# Patient Record
Sex: Male | Born: 1970 | Race: White | Hispanic: No | Marital: Single | Smoking: Current every day smoker
Health system: Southern US, Community
[De-identification: ages and names within clinical notes are randomized; demographics above are authoritative.]

## PROBLEM LIST (undated history)

## (undated) ENCOUNTER — Emergency Department: Admission: EM | Payer: 59 | Source: Home / Self Care

## (undated) DIAGNOSIS — D649 Anemia, unspecified: Secondary | ICD-10-CM

## (undated) DIAGNOSIS — J9690 Respiratory failure, unspecified, unspecified whether with hypoxia or hypercapnia: Secondary | ICD-10-CM

## (undated) DIAGNOSIS — F1910 Other psychoactive substance abuse, uncomplicated: Secondary | ICD-10-CM

## (undated) DIAGNOSIS — K7290 Hepatic failure, unspecified without coma: Secondary | ICD-10-CM

## (undated) DIAGNOSIS — F1010 Alcohol abuse, uncomplicated: Secondary | ICD-10-CM

---

## 2017-05-04 ENCOUNTER — Emergency Department (HOSPITAL_COMMUNITY)
Admission: EM | Admit: 2017-05-04 | Discharge: 2017-05-04 | Disposition: A | Discharge status: Home / Self Care | Payer: BLUE CROSS/BLUE SHIELD | Attending: Emergency Medicine | Admitting: Emergency Medicine

## 2017-05-04 ENCOUNTER — Encounter (HOSPITAL_COMMUNITY): Payer: Self-pay | Admitting: Family Medicine

## 2017-05-04 DIAGNOSIS — Z79891 Long term (current) use of opiate analgesic: Secondary | ICD-10-CM | POA: Diagnosis not present

## 2017-05-04 DIAGNOSIS — F329 Major depressive disorder, single episode, unspecified: Secondary | ICD-10-CM | POA: Insufficient documentation

## 2017-05-04 DIAGNOSIS — F1994 Other psychoactive substance use, unspecified with psychoactive substance-induced mood disorder: Secondary | ICD-10-CM | POA: Diagnosis present

## 2017-05-04 DIAGNOSIS — F32A Depression, unspecified: Secondary | ICD-10-CM

## 2017-05-04 HISTORY — DX: Other psychoactive substance abuse, uncomplicated: F19.10

## 2017-05-04 LAB — RAPID URINE DRUG SCREEN, HOSP PERFORMED
Amphetamines: NOT DETECTED
BARBITURATES: NOT DETECTED
BENZODIAZEPINES: NOT DETECTED
COCAINE: NOT DETECTED
OPIATES: POSITIVE — AB
TETRAHYDROCANNABINOL: NOT DETECTED

## 2017-05-04 LAB — COMPREHENSIVE METABOLIC PANEL
ALT: 89 U/L — AB (ref 17–63)
AST: 41 U/L (ref 15–41)
Albumin: 4.3 g/dL (ref 3.5–5.0)
Alkaline Phosphatase: 46 U/L (ref 38–126)
Anion gap: 10 (ref 5–15)
BUN: 24 mg/dL — AB (ref 6–20)
CHLORIDE: 101 mmol/L (ref 101–111)
CO2: 29 mmol/L (ref 22–32)
CREATININE: 0.95 mg/dL (ref 0.61–1.24)
Calcium: 9.3 mg/dL (ref 8.9–10.3)
GFR calc Af Amer: >60 mL/min (ref >60)
Glucose, Bld: 95 mg/dL (ref 65–99)
POTASSIUM: 3.6 mmol/L (ref 3.5–5.1)
SODIUM: 140 mmol/L (ref 135–145)
Total Bilirubin: 1.6 mg/dL — ABNORMAL HIGH (ref 0.3–1.2)
Total Protein: 7.7 g/dL (ref 6.5–8.1)

## 2017-05-04 LAB — CBC WITH DIFFERENTIAL/PLATELET
BASOS ABS: 0 10*3/uL (ref 0.0–0.1)
BASOS PCT: 1 %
EOS ABS: 0.2 10*3/uL (ref 0.0–0.7)
EOS PCT: 2 %
HCT: 48.1 % (ref 39.0–52.0)
Hemoglobin: 16.5 g/dL (ref 13.0–17.0)
LYMPHS PCT: 29 %
Lymphs Abs: 2.1 10*3/uL (ref 0.7–4.0)
MCH: 32.4 pg (ref 26.0–34.0)
MCHC: 34.3 g/dL (ref 30.0–36.0)
MCV: 94.3 fL (ref 78.0–100.0)
MONO ABS: 0.8 10*3/uL (ref 0.1–1.0)
Monocytes Relative: 11 %
Neutro Abs: 4.2 10*3/uL (ref 1.7–7.7)
Neutrophils Relative %: 57 %
PLATELETS: 230 10*3/uL (ref 150–400)
RBC: 5.1 MIL/uL (ref 4.22–5.81)
RDW: 13.1 % (ref 11.5–15.5)
WBC: 7.3 10*3/uL (ref 4.0–10.5)

## 2017-05-04 LAB — ETHANOL: Alcohol, Ethyl (B): <10 mg/dL (ref <10)

## 2017-05-04 LAB — ACETAMINOPHEN LEVEL

## 2017-05-04 LAB — SALICYLATE LEVEL

## 2017-05-04 MED ORDER — LORAZEPAM 2 MG/ML IJ SOLN: 0.0000 mg | Freq: Two times a day (BID) | INTRAMUSCULAR | Status: DC

## 2017-05-04 MED ORDER — VITAMIN B-1 100 MG PO TABS
100.0000 mg | ORAL_TABLET | Freq: Every day | ORAL | Status: DC
  Administered 2017-05-04: 100 mg via ORAL
  Filled 2017-05-04: qty 1

## 2017-05-04 MED ORDER — LORAZEPAM 1 MG PO TABS: 0.0000 mg | ORAL_TABLET | Freq: Two times a day (BID) | ORAL | Status: DC

## 2017-05-04 MED ORDER — LORAZEPAM 2 MG/ML IJ SOLN: 0.0000 mg | Freq: Four times a day (QID) | INTRAMUSCULAR | Status: DC

## 2017-05-04 MED ORDER — THIAMINE HCL 100 MG/ML IJ SOLN
100.0000 mg | Freq: Every day | INTRAMUSCULAR | Status: DC
  Filled 2017-05-04: qty 2

## 2017-05-04 MED ORDER — LORAZEPAM 1 MG PO TABS: 0.0000 mg | ORAL_TABLET | Freq: Four times a day (QID) | ORAL | Status: DC

## 2017-05-04 NOTE — ED Triage Notes (Signed)
Patient has been accompanied by his stepfather and friend tonight. Patient family is reporting patient could be suicidal and is currently under the influence of drugs. Step father reports patient lives him and patients mother. He has a suicidal history with attempts. Today, patient was driving a company vehicle in [place] when we was taken in police custody for driving while impaired. Initially,  stepfather received a call from patient around 17:30 stating he was a the police station and would need a ride home. About 2 hours later, he received a call from an officer reporting patient was at [facility]. Patient was discharged and informed to family that the hospital was unable to keep him. When triaging patient, he became non-verbal and would not answer questions except to say he does not see it the same way his step father sees things. Patient appears to be under the influence since he has a delayed response.

## 2017-05-04 NOTE — ED Notes (Signed)
Gave report to Holly, RN

## 2017-05-04 NOTE — ED Notes (Signed)
Bed: [facility]3 Expected date:  Expected time:  Means of arrival:  Comments: 

## 2017-05-04 NOTE — Discharge Instructions (Signed)
Please follow up with substance abuse treatment:   [facility] [address] [place], [place] [phone] or [phone]  [facility] [phone] [address] [place] [phone]  [facility]idential [facility]) [address] [place], [place] [phone]  [facility] [address],  [place], [place] [phone]  [facility] [place] [address]  [place], [place], [phone]

## 2017-05-04 NOTE — ED Notes (Signed)
Patient dressed in purple scrubs, belongings placed in cabinet.   TTS at bedside.

## 2017-05-04 NOTE — Patient Outreach (Signed)
ED Peer Support Specialist Patient Intake (Complete at intake & 30-60 Day Follow-up)  Name: Cody RushingDanny Todd [name]  MRN: [id number]  Age: 46 y.o.   Date of Admission: 05/04/2017  Intake: Initial Comments:      Primary Reason Admitted:Pt did not give much information and appeared to be a poor historian. Pt was slow to respond to questions with a flat affect. Pt currently denies SI, HI or AVH. He denies having a history of this. Writer did a record review and pt has not been to [facility] but [facility]. Per notes pt was transported there from jail after stating that he had some "one sided weakness". Full medical work up was done and pt did not seem to have any medical issues. It is suspected pt had secondary gain to go to the hospital to get out of jail. Pt admitted that he has had issues with addiction in the past but denies getting treatment. Unsure if this is accurate information. Pt currently lives with his step dad and mom and step dad brought [facility].  Collateral was gathered from step dad Tim Ney: Jorja Loaim states that pt has been in prison a total of 10 years but has been out for 5. He states that he was using drugs heavily at this time but has not been doing drugs since he got out. He states that he has been using 4-5 beers a night and can become verbally aggressive when he drinks. Step dad states that he is worried about the pt's safety and the safety of the people in the home. He states that he has been "spiraling out of control" and told his mom that he was going to kill himself (a week ago). Pt recently had a break up with his fiance which step dad believes triggered his suicidal ideation. Pt denies that he is suicidal. Step dad believes he is using again and feels unsafe to allow him back in his home. He states that he "needs help" and wants him to get into treatment.      Lab values: Alcohol/ETOH: Negative Positive UDS? 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 No  Number of ED visits:    Reason(s) for visit:    In past year, has patient been hospitalized for any reason? No  Number of hospitalizations:    Reason(s) for hospitalization:    In past year, has patient been arrested? No  Number of arrests:    Reason(s) for arrest:    In past year, has patient been incarcerated? No  Number of incarcerations:    Reason(s) for incarceration:    In past year, has patient received medication-assisted treatment? No  In past year, patient received the following treatments:    In past year, has patient received any harm reduction services? No  Did this include any of the following?    In past year, has patient received care from a mental health provider for diagnosis other than SUD? No  In past year, is this first time patient has overdosed? No  Number of past overdoses:    In  past year, is this first time patient has been hospitalized for an overdose? No  Number of hospitalizations for overdose(s):    Is patient currently receiving treatment for a mental health diagnosis? No  Patient reports experiencing difficulty participating in SUD treatment: No    Most important reason(s) for this difficulty?    Has patient received prior services for treatment? No  In past, patient has received services from following agencies:    Plan of Care:  Suggested follow up at these agencies/treatment centers:    Other information:  CPSS Arlys JohnBrian and Jonny RuizJohn talked with Pt and  try to make positive effort to understand what services Pt may want. CPSS asked the required questions and was unable to gain any information from Pt. CPSS Arlys JohnBrian and John left contact information to follow up with him for outpatient services.    Arlys JohnBrian Quintan Saldivar, CPSS  05/04/2017 12:03 PM

## 2017-05-04 NOTE — BH Assessment (Signed)
Assessment Note  [name] is an 46 y.o. male who came to [facility] with step dad after relapsing on opiates the last few days and getting arrested for driving under the influence yesterday. Pt did not give much information and appeared to be a poor historian. Pt was slow to respond to questions with a flat affect. Pt currently denies SI, HI or AVH. He denies having a history of this. Writer did a record review and pt has not been to [facility] ED but was [facility]. Per notes pt was transported there from jail after stating that he had some "one sided weakness". Full medical work up was done and pt did not seem to have any medical issues. It is suspected pt had secondary gain to go to the hospital to get out of jail. Pt admitted that he has had issues with addiction in the past but denies getting treatment. Unsure if this is accurate information. Pt currently lives with his step dad and mom and step dad [facility].  Collateral was gathered from step dad Tim Ney: Jorja Loaim states that pt has been in prison a total of 10 years but has been out for 5. He states that he was using drugs heavily at this time but has not been doing drugs since he got out. He states that he has been using 4-5 beers a night and can become verbally aggressive when he drinks. Step dad states that he is worried about the pt's safety and the safety of the people in the home. He states that he has been "spiraling out of control" and told his mom that he was going to kill himself (a week ago). Pt recently had a break up with his fiance which step dad believes triggered his suicidal ideation. Pt denies that he is suicidal. Step dad believes he is using again and feels unsafe to allow him back in his home. He states that he "needs help" and wants him to get into treatment.   Consulted with Dr. Sharma CovertNorman and Elta GuadeloupeLaurie Parks NP who do not feel pt meets inpatient criteria. Step Dad has been given resources for substance abuse  treatment and this information will be included in the AVS as well.    Diagnosis: Opiate use disorder severe, Alcohol use disorder severe  Past Medical History:  Past Medical History:  Diagnosis Date  . Polysubstance abuse (HCC)     History reviewed. No pertinent surgical history.  Family History: History reviewed. No pertinent family history.  Social History:  reports that  has never smoked. he has never used smokeless tobacco. He reports that he drinks alcohol. He reports that he uses drugs.  Additional Social History:  Alcohol / Drug Use History of alcohol / drug use?: Yes Substance #1 Name of Substance 1: Opiates- history of addiction  1 - Last Use / Amount: yesterday   CIWA: CIWA-Ar BP: 106/76 Pulse Rate: (!) 54 Nausea and Vomiting: no nausea and no vomiting Tactile Disturbances: none Tremor: no tremor Auditory Disturbances: not present Paroxysmal Sweats: no sweat visible Visual Disturbances: not present Anxiety: no anxiety, at ease Headache, Fullness in Head: none present Agitation: normal activity Orientation and Clouding of Sensorium: cannot do serial additions or is uncertain about date CIWA-Ar Total: 1 COWS:    Allergies: No Known Allergies  Home Medications:  (Not in a hospital admission)  OB/GYN Status:  No LMP for male patient.  General Assessment Data [facility] T[facility] Assessment:  In system Is this a Tele or Face-to-Face Assessment?: Face-to-Face Is this an Initial Assessment or a Re-assessment for this encounter?: Initial Assessment Marital status: (UTA) Is patient pregnant?: No Pregnancy Status: No Living Arrangements: Parent Can pt return to current living arrangement?: No Admission Status: Voluntary Is patient capable of signing voluntary admission?: No Referral Source: Self/Family/Friend Insurance type: BCBS      Crisis Care Plan Living Arrangements: Parent Name of Psychiatrist: None Name of Therapist:  None  Education Status Is patient currently in school?: No  Risk to self with the past 6 months Suicidal Ideation: No Has patient been a risk to self within the past 6 months prior to admission? : Yes Suicidal Intent: No Has patient had any suicidal intent within the past 6 months prior to admission? : No Is patient at risk for suicide?: No Suicidal Plan?: No Has patient had any suicidal plan within the past 6 months prior to admission? : No Access to Means: No What has been your use of drugs/alcohol within the last 12 months?: using opiates Previous Attempts/Gestures: No How many times?: 0 Other Self Harm Risks: None Triggers for Past Attempts: None known Intentional Self Injurious Behavior: None Family Suicide History: Unknown Recent stressful life event(s): Other (Comment) Persecutory voices/beliefs?: No Depression: Yes Depression Symptoms: Despondent Substance abuse history and/or treatment for substance abuse?: Yes Suicide prevention information given to non-admitted patients: Not applicable  Risk to Others within the past 6 months Homicidal Ideation: No Does patient have any lifetime risk of violence toward others beyond the six months prior to admission? : No Thoughts of Harm to Others: No Current Homicidal Intent: No Current Homicidal Plan: No Access to Homicidal Means: No Identified Victim: None History of harm to others?: No Assessment of Violence: None Noted Violent Behavior Description: none Does patient have access to weapons?: No Criminal Charges Pending?: No Does patient have a court date: No Is patient on probation?: No  Psychosis Hallucinations: None noted Delusions: None noted  Mental Status Report Appearance/Hygiene: Unremarkable Eye Contact: Fair Motor Activity: Freedom of movement Speech: Logical/coherent Level of Consciousness: Drowsy, Sedated Mood: Depressed Affect: Depressed Anxiety Level: None Judgement: Impaired Orientation: Person,  Place, Time, Situation Obsessive Compulsive Thoughts/Behaviors: None  Cognitive Functioning Concentration: Poor Memory: Recent Intact, Remote Intact IQ: Average Insight: Poor Impulse Control: Poor Appetite: Fair Weight Loss: 0 Weight Gain: 0 Sleep: Decreased Total Hours of Sleep: 5  ADLScreening [facility] Assessment Services) Patient's cognitive ability adequate to safely complete daily activities?: Yes Patient able to express need for assistance with ADLs?: Yes Independently performs ADLs?: Yes (appropriate for developmental age)  Prior Inpatient Therapy Prior Inpatient Therapy: (UTA)  Prior Outpatient Therapy Prior Outpatient Therapy: (UTA) Does patient have an ACCT team?: No Does patient have Intensive In-House Services?  : No Does patient have [facility] services? : No Does patient have P4CC services?: No  ADL Screening (condition at time of admission) Patient's cognitive ability adequate to safely complete daily activities?: Yes Is the patient deaf or have difficulty hearing?: No Does the patient have difficulty seeing, even when wearing glasses/contacts?: No Does the patient have difficulty concentrating, remembering, or making decisions?: No Patient able to express need for assistance with ADLs?: Yes Does the patient have difficulty dressing or bathing?: No Independently performs ADLs?: Yes (appropriate for developmental age) Does the patient have difficulty walking or climbing stairs?: No Weakness of Legs: None Weakness of Arms/Hands: None  Home Assistive Devices/Equipment Home Assistive Devices/Equipment: None  Therapy Consults (therapy consults require a physician  order) PT Evaluation Needed: No OT Evalulation Needed: No SLP Evaluation Needed: No Abuse/Neglect Assessment (Assessment to be complete while patient is alone) Abuse/Neglect Assessment Can Be Completed: Unable to assess, patient is non-responsive or altered mental status Values / Beliefs Cultural  Requests During Hospitalization: None Spiritual Requests During Hospitalization: None Consults Spiritual Care Consult Needed: No Social Work Consult Needed: No Merchant navy officer (For Healthcare) Does Patient Have a Medical Advance Directive?: No Would patient like information on creating a medical advance directive?: No - Patient declined Nutrition Screen- [facility] Patient's home diet: Regular Has the patient recently lost weight without trying?: No Has the patient been eating poorly because of a decreased appetite?: No Malnutrition Screening Tool Score: 0  Additional Information 1:1 In Past 12 Months?: No CIRT Risk: No Elopement Risk: No Does patient have medical clearance?: Yes     Disposition: D/C with outpatient resources per Dr. Sharma Covert. Peer support consulted. Disposition Initial Assessment Completed for this Encounter: Yes  On Site Evaluation by:   Donetta Potts, LCAS  Reviewed with Physician:  Dr. Burke Keels [facility] 05/04/2017 10:57 AM

## 2017-05-04 NOTE — ED Notes (Signed)
Pt aware we need urine sample.  

## 2017-05-04 NOTE — [facility] Suicide Risk Assessment (Signed)
Suicide Risk Assessment  Discharge Assessment   [facility]   Principal Problem: Substance induced mood disorder [facility](HCC) Discharge Diagnoses:  Patient Active Problem List   Diagnosis Date Noted  . Substance induced mood disorder (HCC) [F19.94] 05/04/2017    Total Time spent with patient: 30 minutes  Musculoskeletal: Strength & Muscle Tone: within normal limits Gait & Station: normal Patient leans: N/A  Psychiatric Specialty Exam:   Blood pressure (!) 101/58, pulse 66, temperature 97.9 F (36.6 C), temperature source Oral, resp. rate 16, SpO2 99 %.There is no height or weight on file to calculate BMI.  General Appearance: Casual  Eye Contact::  Good  Speech:  Clear and Coherent409  Volume:  Normal  Mood:  Depressed  Affect:  Congruent and Depressed  Thought Process:  Coherent  Orientation:  Full (Time, Place, and Person)  Thought Content:  Logical  Suicidal Thoughts:  No  Homicidal Thoughts:  No  Memory:  Immediate;   Good Recent;   Good Remote;   Fair  Judgement:  Fair  Insight:  Fair  Psychomotor Activity:  Normal  Concentration:  Fair  Recall:  Good  Fund of Knowledge:Good  Language: Good  Akathisia:  No  Handed:  Right  AIMS (if indicated):     Assets:  ArchitectCommunication Skills Financial Resources/Insurance Housing Physical Health Social Support  Sleep:     Cognition: WNL  ADL's:  Intact   Mental Status Per Nursing Assessment::   On Admission:   Intoxicated  Demographic Factors:  Male  Loss Factors: Financial problems/change in socioeconomic status  Historical Factors: Impulsivity  Risk Reduction Factors:   Sense of responsibility to family  Continued Clinical Symptoms:  Depression:   Comorbid alcohol abuse/dependence  Cognitive Features That Contribute To Risk:  Closed-mindedness    Suicide Risk:  Minimal: No identifiable suicidal ideation.  Patients presenting with no risk factors but with morbid ruminations; may  be classified as minimal risk based on the severity of the depressive symptoms    Plan Of Care/Follow-up recommendations:  Activity:  as tolerated Diet:  Heart Healthy  Laveda AbbeLaurie Britton Parks, NP 05/04/2017, 12:33 PM

## 2017-05-04 NOTE — ED Provider Notes (Signed)
[facility]-EMERGENCY DEPT Provider Note   CSN: [id number] Arrival date & time: 05/04/17  0007     History   Chief Complaint Chief Complaint  Patient presents with  . Psychiatric Evaluation    HPI [name] is a 46 y.o. male.  Patient presents to the emergency department with chief complaint of depression, suicidal 8 deviation, and substance abuse.  He is accompanied by his stepfather, he states that the patient has been very depressed and suicidal since breaking up with his fiance a couple of months ago.  His stepfather reports that the patient has been using drugs and alcohol extensively.  He reports that the patient is not acting himself.  Stepfather reports that the patient has made violent threats towards his mother, and has also expressed intent to commit suicide.  Patient denies any pain.  Denies any symptoms at this time.   The history is provided by the patient. No language interpreter was used.    Past Medical History:  Diagnosis Date  . Polysubstance abuse (HCC)     There are no active problems to display for this patient.   History reviewed. No pertinent surgical history.     Home Medications    Prior to Admission medications   Not on File    Family History History reviewed. No pertinent family history.  Social History Social History   Tobacco Use  . Smoking status: Never Smoker  . Smokeless tobacco: Never Used  Substance Use Topics  . Alcohol use: Yes  . Drug use: Yes     Allergies   Patient has no allergy information on record.   Review of Systems Review of Systems  All other systems reviewed and are negative.    Physical Exam Updated Vital Signs There were no vitals taken for this visit.  Physical Exam  Constitutional: He is oriented to person, place, and time. He appears well-developed and well-nourished.  HENT:  Head: Normocephalic and atraumatic.  Eyes: Conjunctivae and EOM are normal. Pupils  are equal, round, and reactive to light. Right eye exhibits no discharge. Left eye exhibits no discharge. No scleral icterus.  Neck: Normal range of motion. Neck supple. No JVD present.  Cardiovascular: Normal rate, regular rhythm and normal heart sounds. Exam reveals no gallop and no friction rub.  No murmur heard. Pulmonary/Chest: Effort normal and breath sounds normal. No respiratory distress. He has no wheezes. He has no rales. He exhibits no tenderness.  Abdominal: Soft. He exhibits no distension and no mass. There is no tenderness. There is no rebound and no guarding.  Musculoskeletal: Normal range of motion. He exhibits no edema or tenderness.  Neurological: He is alert and oriented to person, place, and time.  Skin: Skin is warm and dry.  Psychiatric: He has a normal mood and affect. His behavior is normal. Judgment and thought content normal.  Nursing note and vitals reviewed.    ED Treatments / Results  Labs (all labs ordered are listed, but only abnormal results are displayed) Labs Reviewed  CBC WITH DIFFERENTIAL/PLATELET  COMPREHENSIVE METABOLIC PANEL  ACETAMINOPHEN LEVEL  SALICYLATE LEVEL  ETHANOL  RAPID URINE DRUG SCREEN, HOSP PERFORMED    EKG  EKG Interpretation None       Radiology No results found.  Procedures Procedures (including critical care time)  Medications Ordered in ED Medications - No data to display   Initial Impression / Assessment and Plan / ED Course  I have reviewed the triage vital signs and the  nursing notes.  Pertinent labs & imaging results that were available during my care of the patient were reviewed by me and considered in my medical decision making (see chart for details).     Patient here for depression, suicidal ideation, and substance abuse.  Patient is currently not very talkative, but does respond to direct questions.  Will perform medical screening exam and labs, plan for TTS and psychiatry evaluation.  Patient  currently here voluntarily.  6:05 AM Medically clear, observed in ED overnight.  Labs are reassuring.    TTS consult pending.  Morning team aware of patient.  Final Clinical Impressions(s) / ED Diagnoses   Final diagnoses:  None    ED Discharge Orders    None       Roxy HorsemanBrowning, Lamoyne Hessel, PA-C 05/04/17 0606    Gilda CreasePollina, Christopher J, MD 05/04/17 [phone]

## 2020-01-20 ENCOUNTER — Other Ambulatory Visit: Payer: Self-pay

## 2020-01-20 DIAGNOSIS — M25551 Pain in right hip: Secondary | ICD-10-CM | POA: Insufficient documentation

## 2020-01-20 DIAGNOSIS — Y9389 Activity, other specified: Secondary | ICD-10-CM | POA: Insufficient documentation

## 2020-01-20 DIAGNOSIS — Y999 Unspecified external cause status: Secondary | ICD-10-CM | POA: Insufficient documentation

## 2020-01-20 DIAGNOSIS — Y929 Unspecified place or not applicable: Secondary | ICD-10-CM | POA: Insufficient documentation

## 2020-01-20 DIAGNOSIS — R42 Dizziness and giddiness: Secondary | ICD-10-CM | POA: Insufficient documentation

## 2020-01-20 DIAGNOSIS — Z5321 Procedure and treatment not carried out due to patient leaving prior to being seen by health care provider: Secondary | ICD-10-CM | POA: Insufficient documentation

## 2020-01-20 DIAGNOSIS — R220 Localized swelling, mass and lump, head: Secondary | ICD-10-CM | POA: Insufficient documentation

## 2020-01-20 DIAGNOSIS — W228XXA Striking against or struck by other objects, initial encounter: Secondary | ICD-10-CM | POA: Insufficient documentation

## 2020-01-20 LAB — CBC
HCT: 29.5 % — ABNORMAL LOW (ref 39.0–52.0)
Hemoglobin: 10.4 g/dL — ABNORMAL LOW (ref 13.0–17.0)
MCH: 32.9 pg (ref 26.0–34.0)
MCHC: 35.3 g/dL (ref 30.0–36.0)
MCV: 93.4 fL (ref 80.0–100.0)
Platelets: 64 10*3/uL — ABNORMAL LOW (ref 150–400)
RBC: 3.16 MIL/uL — ABNORMAL LOW (ref 4.22–5.81)
RDW: 18.3 % — ABNORMAL HIGH (ref 11.5–15.5)
WBC: 8.5 10*3/uL (ref 4.0–10.5)
nRBC: 0 % (ref 0.0–0.2)

## 2020-01-20 LAB — BASIC METABOLIC PANEL
Anion gap: 13 (ref 5–15)
BUN: 28 mg/dL — ABNORMAL HIGH (ref 6–20)
CO2: 25 mmol/L (ref 22–32)
Calcium: 7.3 mg/dL — ABNORMAL LOW (ref 8.9–10.3)
Chloride: 94 mmol/L — ABNORMAL LOW (ref 98–111)
Creatinine, Ser: 1.39 mg/dL — ABNORMAL HIGH (ref 0.61–1.24)
GFR calc Af Amer: >60 mL/min (ref >60)
GFR calc non Af Amer: 59 mL/min — ABNORMAL LOW (ref >60)
Glucose, Bld: 119 mg/dL — ABNORMAL HIGH (ref 70–99)
Potassium: 3.4 mmol/L — ABNORMAL LOW (ref 3.5–5.1)
Sodium: 132 mmol/L — ABNORMAL LOW (ref 135–145)

## 2020-01-20 NOTE — ED Triage Notes (Signed)
Pt comes via EMS with c/o right hip pain following dizzy fall 4 days ago., pt also c/o left sided facial swelling. Pt states he hit his face on the walker.  Pt states his leg just gave out and went numb.

## 2020-01-21 ENCOUNTER — Emergency Department
Admission: EM | Admit: 2020-01-21 | Discharge: 2020-01-21 | Disposition: A | Discharge status: Home / Self Care | Payer: Self-pay | Attending: Emergency Medicine | Admitting: Emergency Medicine

## 2020-01-21 ENCOUNTER — Emergency Department: Payer: Self-pay

## 2020-01-21 LAB — TROPONIN I (HIGH SENSITIVITY): Troponin I (High Sensitivity): 39 ng/L — ABNORMAL HIGH (ref <18)

## 2020-01-21 NOTE — ED Notes (Addendum)
Pt states he called an Benedetto Goad and is leaving.  Tried to tell pt he was next in line to be seen, But he is at the top of the parking lot.

## 2020-01-22 ENCOUNTER — Telehealth: Payer: Self-pay | Admitting: Emergency Medicine

## 2020-01-22 NOTE — Telephone Encounter (Signed)
Called patient due to lwot to inquire about condition and follow up plans. Left message.   

## 2020-01-23 ENCOUNTER — Other Ambulatory Visit: Payer: Self-pay

## 2020-01-23 ENCOUNTER — Emergency Department: Payer: Self-pay

## 2020-01-23 ENCOUNTER — Encounter: Payer: Self-pay | Admitting: Emergency Medicine

## 2020-01-23 ENCOUNTER — Inpatient Hospital Stay
Admission: EM | Admit: 2020-01-23 | Discharge: 2020-02-14 | DRG: 870 | Disposition: A | Discharge status: Home / Self Care | Payer: Self-pay | Attending: Internal Medicine | Admitting: Internal Medicine

## 2020-01-23 ENCOUNTER — Inpatient Hospital Stay: Payer: Self-pay

## 2020-01-23 DIAGNOSIS — S1093XA Contusion of unspecified part of neck, initial encounter: Secondary | ICD-10-CM | POA: Diagnosis present

## 2020-01-23 DIAGNOSIS — I110 Hypertensive heart disease with heart failure: Secondary | ICD-10-CM | POA: Diagnosis present

## 2020-01-23 DIAGNOSIS — D62 Acute posthemorrhagic anemia: Secondary | ICD-10-CM | POA: Diagnosis present

## 2020-01-23 DIAGNOSIS — K759 Inflammatory liver disease, unspecified: Secondary | ICD-10-CM

## 2020-01-23 DIAGNOSIS — I5031 Acute diastolic (congestive) heart failure: Secondary | ICD-10-CM | POA: Diagnosis not present

## 2020-01-23 DIAGNOSIS — D696 Thrombocytopenia, unspecified: Secondary | ICD-10-CM | POA: Diagnosis present

## 2020-01-23 DIAGNOSIS — W010XXA Fall on same level from slipping, tripping and stumbling without subsequent striking against object, initial encounter: Secondary | ICD-10-CM | POA: Diagnosis present

## 2020-01-23 DIAGNOSIS — F10931 Alcohol use, unspecified with withdrawal delirium: Secondary | ICD-10-CM

## 2020-01-23 DIAGNOSIS — X501XXA Overexertion from prolonged static or awkward postures, initial encounter: Secondary | ICD-10-CM

## 2020-01-23 DIAGNOSIS — R7401 Elevation of levels of liver transaminase levels: Secondary | ICD-10-CM | POA: Diagnosis present

## 2020-01-23 DIAGNOSIS — J690 Pneumonitis due to inhalation of food and vomit: Secondary | ICD-10-CM | POA: Diagnosis not present

## 2020-01-23 DIAGNOSIS — J9602 Acute respiratory failure with hypercapnia: Secondary | ICD-10-CM | POA: Diagnosis not present

## 2020-01-23 DIAGNOSIS — M25571 Pain in right ankle and joints of right foot: Secondary | ICD-10-CM | POA: Diagnosis not present

## 2020-01-23 DIAGNOSIS — S8011XA Contusion of right lower leg, initial encounter: Secondary | ICD-10-CM | POA: Diagnosis present

## 2020-01-23 DIAGNOSIS — D7589 Other specified diseases of blood and blood-forming organs: Secondary | ICD-10-CM | POA: Diagnosis present

## 2020-01-23 DIAGNOSIS — Z515 Encounter for palliative care: Secondary | ICD-10-CM

## 2020-01-23 DIAGNOSIS — W100XXS Fall (on)(from) escalator, sequela: Secondary | ICD-10-CM

## 2020-01-23 DIAGNOSIS — K7010 Alcoholic hepatitis without ascites: Secondary | ICD-10-CM | POA: Diagnosis present

## 2020-01-23 DIAGNOSIS — F10229 Alcohol dependence with intoxication, unspecified: Secondary | ICD-10-CM | POA: Diagnosis present

## 2020-01-23 DIAGNOSIS — A419 Sepsis, unspecified organism: Principal | ICD-10-CM | POA: Diagnosis present

## 2020-01-23 DIAGNOSIS — D539 Nutritional anemia, unspecified: Secondary | ICD-10-CM | POA: Diagnosis present

## 2020-01-23 DIAGNOSIS — F1010 Alcohol abuse, uncomplicated: Secondary | ICD-10-CM

## 2020-01-23 DIAGNOSIS — M25551 Pain in right hip: Secondary | ICD-10-CM

## 2020-01-23 DIAGNOSIS — W19XXXA Unspecified fall, initial encounter: Secondary | ICD-10-CM | POA: Diagnosis present

## 2020-01-23 DIAGNOSIS — E871 Hypo-osmolality and hyponatremia: Secondary | ICD-10-CM | POA: Diagnosis present

## 2020-01-23 DIAGNOSIS — J9600 Acute respiratory failure, unspecified whether with hypoxia or hypercapnia: Secondary | ICD-10-CM

## 2020-01-23 DIAGNOSIS — Z01818 Encounter for other preprocedural examination: Secondary | ICD-10-CM

## 2020-01-23 DIAGNOSIS — E861 Hypovolemia: Secondary | ICD-10-CM | POA: Diagnosis present

## 2020-01-23 DIAGNOSIS — M25559 Pain in unspecified hip: Secondary | ICD-10-CM | POA: Diagnosis present

## 2020-01-23 DIAGNOSIS — K2921 Alcoholic gastritis with bleeding: Secondary | ICD-10-CM

## 2020-01-23 DIAGNOSIS — I480 Paroxysmal atrial fibrillation: Secondary | ICD-10-CM

## 2020-01-23 DIAGNOSIS — M25579 Pain in unspecified ankle and joints of unspecified foot: Secondary | ICD-10-CM

## 2020-01-23 DIAGNOSIS — E876 Hypokalemia: Secondary | ICD-10-CM | POA: Diagnosis present

## 2020-01-23 DIAGNOSIS — R7989 Other specified abnormal findings of blood chemistry: Secondary | ICD-10-CM

## 2020-01-23 DIAGNOSIS — Z6841 Body Mass Index (BMI) 40.0 and over, adult: Secondary | ICD-10-CM

## 2020-01-23 DIAGNOSIS — E7800 Pure hypercholesterolemia, unspecified: Secondary | ICD-10-CM

## 2020-01-23 DIAGNOSIS — S300XXA Contusion of lower back and pelvis, initial encounter: Secondary | ICD-10-CM | POA: Diagnosis present

## 2020-01-23 DIAGNOSIS — E785 Hyperlipidemia, unspecified: Secondary | ICD-10-CM | POA: Diagnosis present

## 2020-01-23 DIAGNOSIS — Z20822 Contact with and (suspected) exposure to covid-19: Secondary | ICD-10-CM | POA: Diagnosis present

## 2020-01-23 DIAGNOSIS — R296 Repeated falls: Secondary | ICD-10-CM | POA: Diagnosis present

## 2020-01-23 DIAGNOSIS — J9601 Acute respiratory failure with hypoxia: Secondary | ICD-10-CM | POA: Diagnosis not present

## 2020-01-23 DIAGNOSIS — M6282 Rhabdomyolysis: Secondary | ICD-10-CM | POA: Diagnosis present

## 2020-01-23 DIAGNOSIS — I959 Hypotension, unspecified: Secondary | ICD-10-CM | POA: Diagnosis present

## 2020-01-23 DIAGNOSIS — F1994 Other psychoactive substance use, unspecified with psychoactive substance-induced mood disorder: Secondary | ICD-10-CM | POA: Diagnosis present

## 2020-01-23 DIAGNOSIS — D689 Coagulation defect, unspecified: Secondary | ICD-10-CM | POA: Diagnosis present

## 2020-01-23 DIAGNOSIS — K922 Gastrointestinal hemorrhage, unspecified: Secondary | ICD-10-CM | POA: Diagnosis present

## 2020-01-23 DIAGNOSIS — Z79899 Other long term (current) drug therapy: Secondary | ICD-10-CM

## 2020-01-23 DIAGNOSIS — K760 Fatty (change of) liver, not elsewhere classified: Secondary | ICD-10-CM | POA: Diagnosis present

## 2020-01-23 DIAGNOSIS — R509 Fever, unspecified: Secondary | ICD-10-CM

## 2020-01-23 DIAGNOSIS — Y908 Blood alcohol level of 240 mg/100 ml or more: Secondary | ICD-10-CM | POA: Diagnosis present

## 2020-01-23 DIAGNOSIS — Z7189 Other specified counseling: Secondary | ICD-10-CM

## 2020-01-23 DIAGNOSIS — Z66 Do not resuscitate: Secondary | ICD-10-CM | POA: Diagnosis not present

## 2020-01-23 DIAGNOSIS — S7011XA Contusion of right thigh, initial encounter: Secondary | ICD-10-CM | POA: Diagnosis present

## 2020-01-23 DIAGNOSIS — G92 Toxic encephalopathy: Secondary | ICD-10-CM | POA: Diagnosis not present

## 2020-01-23 DIAGNOSIS — K7200 Acute and subacute hepatic failure without coma: Secondary | ICD-10-CM | POA: Diagnosis present

## 2020-01-23 DIAGNOSIS — R609 Edema, unspecified: Secondary | ICD-10-CM

## 2020-01-23 DIAGNOSIS — F10231 Alcohol dependence with withdrawal delirium: Secondary | ICD-10-CM | POA: Diagnosis not present

## 2020-01-23 DIAGNOSIS — S7001XA Contusion of right hip, initial encounter: Secondary | ICD-10-CM | POA: Diagnosis present

## 2020-01-23 HISTORY — DX: Sepsis, unspecified organism: A41.9

## 2020-01-23 HISTORY — DX: Anemia, unspecified: D64.9

## 2020-01-23 HISTORY — DX: Respiratory failure, unspecified, unspecified whether with hypoxia or hypercapnia: J96.90

## 2020-01-23 HISTORY — DX: Alcohol abuse, uncomplicated: F10.10

## 2020-01-23 HISTORY — DX: Rhabdomyolysis: M62.82

## 2020-01-23 HISTORY — DX: Hepatic failure, unspecified without coma: K72.90

## 2020-01-23 HISTORY — DX: Alcohol dependence with withdrawal delirium: F10.231

## 2020-01-23 HISTORY — DX: Alcohol use, unspecified with withdrawal delirium: F10.931

## 2020-01-23 LAB — CBC WITH DIFFERENTIAL/PLATELET
Abs Immature Granulocytes: 0.15 10*3/uL — ABNORMAL HIGH (ref 0.00–0.07)
Basophils Absolute: 0.1 10*3/uL (ref 0.0–0.1)
Basophils Relative: 1 %
Eosinophils Absolute: 0 10*3/uL (ref 0.0–0.5)
Eosinophils Relative: 0 %
HCT: 19.1 % — ABNORMAL LOW (ref 39.0–52.0)
Hemoglobin: 6.7 g/dL — ABNORMAL LOW (ref 13.0–17.0)
Immature Granulocytes: 1 %
Lymphocytes Relative: 19 %
Lymphs Abs: 2.2 10*3/uL (ref 0.7–4.0)
MCH: 34 pg (ref 26.0–34.0)
MCHC: 35.1 g/dL (ref 30.0–36.0)
MCV: 97 fL (ref 80.0–100.0)
Monocytes Absolute: 2.1 10*3/uL — ABNORMAL HIGH (ref 0.1–1.0)
Monocytes Relative: 18 %
Neutro Abs: 7.1 10*3/uL (ref 1.7–7.7)
Neutrophils Relative %: 61 %
Platelets: 123 10*3/uL — ABNORMAL LOW (ref 150–400)
RBC: 1.97 MIL/uL — ABNORMAL LOW (ref 4.22–5.81)
RDW: 19.2 % — ABNORMAL HIGH (ref 11.5–15.5)
WBC: 11.6 10*3/uL — ABNORMAL HIGH (ref 4.0–10.5)
nRBC: 1.3 % — ABNORMAL HIGH (ref 0.0–0.2)

## 2020-01-23 LAB — AMMONIA: Ammonia: 42 umol/L — ABNORMAL HIGH (ref 9–35)

## 2020-01-23 LAB — COMPREHENSIVE METABOLIC PANEL
ALT: 83 U/L — ABNORMAL HIGH (ref 0–44)
AST: 293 U/L — ABNORMAL HIGH (ref 15–41)
Albumin: 3 g/dL — ABNORMAL LOW (ref 3.5–5.0)
Alkaline Phosphatase: 53 U/L (ref 38–126)
Anion gap: 15 (ref 5–15)
BUN: 27 mg/dL — ABNORMAL HIGH (ref 6–20)
CO2: 25 mmol/L (ref 22–32)
Calcium: 7 mg/dL — ABNORMAL LOW (ref 8.9–10.3)
Chloride: 89 mmol/L — ABNORMAL LOW (ref 98–111)
Creatinine, Ser: 1.06 mg/dL (ref 0.61–1.24)
GFR calc Af Amer: >60 mL/min (ref >60)
GFR calc non Af Amer: >60 mL/min (ref >60)
Glucose, Bld: 140 mg/dL — ABNORMAL HIGH (ref 70–99)
Potassium: 3.3 mmol/L — ABNORMAL LOW (ref 3.5–5.1)
Sodium: 129 mmol/L — ABNORMAL LOW (ref 135–145)
Total Bilirubin: 14.2 mg/dL — ABNORMAL HIGH (ref 0.3–1.2)
Total Protein: 6.4 g/dL — ABNORMAL LOW (ref 6.5–8.1)

## 2020-01-23 LAB — PROTIME-INR
INR: 1.5 — ABNORMAL HIGH (ref 0.8–1.2)
Prothrombin Time: 17.3 s — ABNORMAL HIGH (ref 11.4–15.2)

## 2020-01-23 LAB — ABO/RH: ABO/RH(D): O POS

## 2020-01-23 LAB — APTT: aPTT: 33 seconds (ref 24–36)

## 2020-01-23 LAB — PREPARE RBC (CROSSMATCH)

## 2020-01-23 LAB — SARS CORONAVIRUS 2 BY RT PCR (HOSPITAL ORDER, PERFORMED IN ~~LOC~~ HOSPITAL LAB): SARS Coronavirus 2: NEGATIVE

## 2020-01-23 LAB — SAMPLE TO BLOOD BANK

## 2020-01-23 LAB — MAGNESIUM: Magnesium: 2 mg/dL (ref 1.7–2.4)

## 2020-01-23 LAB — ETHANOL: Alcohol, Ethyl (B): 296 mg/dL — ABNORMAL HIGH (ref <10)

## 2020-01-23 LAB — ACETAMINOPHEN LEVEL: Acetaminophen (Tylenol), Serum: <10 ug/mL — ABNORMAL LOW (ref 10–30)

## 2020-01-23 MED ORDER — SODIUM CHLORIDE 0.9 % IV SOLN: INTRAVENOUS | Status: DC

## 2020-01-23 MED ORDER — THIAMINE HCL 100 MG/ML IJ SOLN
75.0000 mL/h | Freq: Once | INTRAVENOUS | Status: AC
  Administered 2020-01-23: 75 mL/h via INTRAVENOUS
  Filled 2020-01-23: qty 1000

## 2020-01-23 MED ORDER — TRAZODONE HCL 50 MG PO TABS: 25.0000 mg | ORAL_TABLET | Freq: Every evening | ORAL | Status: DC | PRN

## 2020-01-23 MED ORDER — SODIUM CHLORIDE 0.9 % IV SOLN
1.0000 g | Freq: Once | INTRAVENOUS | Status: AC
  Administered 2020-01-23: 1 g via INTRAVENOUS
  Filled 2020-01-23: qty 10

## 2020-01-23 MED ORDER — OCTREOTIDE LOAD VIA INFUSION
50.0000 ug | Freq: Once | INTRAVENOUS | Status: AC
  Administered 2020-01-23: 50 ug via INTRAVENOUS
  Filled 2020-01-23: qty 25

## 2020-01-23 MED ORDER — SODIUM CHLORIDE 0.9 % IV SOLN
10.0000 mL/h | Freq: Once | INTRAVENOUS | Status: AC
  Administered 2020-01-23: 10 mL/h via INTRAVENOUS

## 2020-01-23 MED ORDER — POTASSIUM CHLORIDE 20 MEQ PO PACK
40.0000 meq | PACK | Freq: Once | ORAL | Status: AC
  Administered 2020-01-23: 40 meq via ORAL
  Filled 2020-01-23: qty 2

## 2020-01-23 MED ORDER — SODIUM CHLORIDE 0.9 % IV SOLN
50.0000 ug/h | INTRAVENOUS | Status: DC
  Administered 2020-01-23 – 2020-01-29 (×7): 50 ug/h via INTRAVENOUS
  Filled 2020-01-23 (×23): qty 1

## 2020-01-23 MED ORDER — PANTOPRAZOLE SODIUM 40 MG IV SOLR: 40.0000 mg | Freq: Two times a day (BID) | INTRAVENOUS | Status: DC

## 2020-01-23 MED ORDER — MORPHINE SULFATE (PF) 2 MG/ML IV SOLN
2.0000 mg | INTRAVENOUS | Status: DC | PRN
  Administered 2020-01-24 – 2020-01-27 (×9): 2 mg via INTRAVENOUS
  Filled 2020-01-23 (×9): qty 1

## 2020-01-23 MED ORDER — ONDANSETRON HCL 4 MG PO TABS: 4.0000 mg | ORAL_TABLET | Freq: Four times a day (QID) | ORAL | Status: DC | PRN

## 2020-01-23 MED ORDER — POTASSIUM CHLORIDE 20 MEQ PO PACK: 40.0000 meq | PACK | Freq: Once | ORAL | Status: DC

## 2020-01-23 MED ORDER — ACETYLCYSTEINE LOAD VIA INFUSION
150.0000 mg/kg | Freq: Once | INTRAVENOUS | Status: AC
  Administered 2020-01-23: 16335 mg via INTRAVENOUS
  Filled 2020-01-23: qty 409

## 2020-01-23 MED ORDER — ACETAMINOPHEN 325 MG PO TABS
650.0000 mg | ORAL_TABLET | Freq: Four times a day (QID) | ORAL | Status: DC | PRN
  Administered 2020-01-24 – 2020-02-01 (×5): 650 mg via ORAL
  Filled 2020-01-23 (×6): qty 2

## 2020-01-23 MED ORDER — ONDANSETRON HCL 4 MG/2ML IJ SOLN
4.0000 mg | Freq: Four times a day (QID) | INTRAMUSCULAR | Status: DC | PRN
  Administered 2020-01-27: 4 mg via INTRAVENOUS
  Filled 2020-01-23: qty 2

## 2020-01-23 MED ORDER — SODIUM CHLORIDE 0.9 % IV SOLN
8.0000 mg/h | INTRAVENOUS | Status: AC
  Administered 2020-01-23 – 2020-01-26 (×8): 8 mg/h via INTRAVENOUS
  Filled 2020-01-23 (×9): qty 80

## 2020-01-23 MED ORDER — LORAZEPAM 2 MG/ML IJ SOLN: 1.0000 mg | INTRAMUSCULAR | Status: DC | PRN

## 2020-01-23 MED ORDER — SODIUM CHLORIDE 0.9 % IV SOLN
80.0000 mg | Freq: Once | INTRAVENOUS | Status: AC
  Administered 2020-01-23: 80 mg via INTRAVENOUS
  Filled 2020-01-23: qty 80

## 2020-01-23 MED ORDER — DEXTROSE 5 % IV SOLN
15.0000 mg/kg/h | INTRAVENOUS | Status: DC
  Administered 2020-01-23: 15 mg/kg/h via INTRAVENOUS
  Filled 2020-01-23 (×2): qty 120

## 2020-01-23 MED ORDER — ACETAMINOPHEN 650 MG RE SUPP: 650.0000 mg | Freq: Four times a day (QID) | RECTAL | Status: DC | PRN
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
## 2020-01-23 NOTE — ED Notes (Signed)
Pt states he does not want any "heavy narcotics" Ultrasound at bedside.

## 2020-01-23 NOTE — ED Notes (Signed)
Provider at bedside and told pt he would need a blood transfusion and reviewed with pt. Provider also stated pt was hemacult positive

## 2020-01-23 NOTE — ED Notes (Signed)
pts room air saturation was 89%. Pt placed on 2lpm via 
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
## 2020-01-23 NOTE — ED Provider Notes (Signed)
[facility] Emergency Department Provider Note  ____________________________________________   I have reviewed the triage vital signs and the nursing notes.   HISTORY  Chief Complaint Fall   History limited by: Not Limited   HPI [name] is a 49 y.o. male who presents to the emergency department today because of concern for increased weakness, falls and bruising. The weakness and falls has been getting more frequent over the past couple of weeks. He is complaining of pain to his right hip. The patient says that he has also noticed increasing bruising and ease of bruising over the past month. The patient does drink 5-6 beers a day. Denies any history of liver disease.   Records reviewed. Per medical record review patient has a history of polysubstance abuse.  Past Medical History:  Diagnosis Date  . Polysubstance abuse [facility])     Patient Active Problem List   Diagnosis Date Noted  . Substance induced mood disorder ([facility]) 05/04/2017    History reviewed. No pertinent surgical history.  Prior to Admission medications   Not on File    Allergies Patient has no known allergies.  No family history on file.  Social History Social History   Tobacco Use  . Smoking status: Never Smoker  . Smokeless tobacco: Never Used  Vaping Use  . Vaping Use: Never used  Substance Use Topics  . Alcohol use: Yes    Comment: 5-6 every other day  . Drug use: Not Currently    Review of Systems Constitutional: No fever/chills Eyes: No visual changes. ENT: No sore throat. Cardiovascular: Denies chest pain. Respiratory: Denies shortness of breath. Gastrointestinal: No abdominal pain.  No nausea, no vomiting.  No diarrhea.   Genitourinary: Negative for dysuria. Musculoskeletal: Negative for back pain. Skin: Positive for bruising.  Neurological: Positive for weakness.  ____________________________________________   PHYSICAL EXAM:  VITAL SIGNS: ED  Triage Vitals [01/23/20 1903]  Enc Vitals Group     BP (!) 109/43     Pulse Rate (!) 106     Resp 18     Temp 98.5 F (36.9 C)     Temp Source Oral     SpO2 92 %   Constitutional: Alert and oriented.  Eyes: Scleral icterus.  ENT      Head: Normocephalic and atraumatic.      Nose: No congestion/rhinnorhea.      Mouth/Throat: Mucous membranes are moist.      Neck: No stridor. Hematological/Lymphatic/Immunilogical: No cervical lymphadenopathy. Cardiovascular: Tachycardia, regular rhythm.  No murmurs, rubs, or gallops.  Respiratory: Normal respiratory effort without tachypnea nor retractions. Breath sounds are clear and equal bilaterally. No wheezes/rales/rhonchi. Gastrointestinal: Soft and non tender. No rebound. No guarding.  Rectal: Dark stool on gloves. GUIAC positive.  Musculoskeletal: Normal range of motion in all extremities. No lower extremity edema. Neurologic:  Normal speech and language. No gross focal neurologic deficits are appreciated.  Skin:  Juandice, multiple areas of ecchymosis and hematoma noted.  Psychiatric: Mood and affect are normal. Speech and behavior are normal. Patient exhibits appropriate insight and judgment.  ____________________________________________    LABS (pertinent positives/negatives)  INR 1.5 Ethanol 296 CBC wbc 11.6, hgb 6.7, plt 123 Ammonia 42 CMP na 129, k 3.3, cl 89, glu 140, cr 1.06, ca 7.0, ast 293, alt 83, t bili 14.2  ____________________________________________   EKG  None  ____________________________________________    RADIOLOGY  RUQ US Hepatic steatosis  Right hip Concern for possible fracture of pelvis and femur  CT  pelvis pending at time of admission ____________________________________________   PROCEDURES  Procedures  ____________________________________________   INITIAL IMPRESSION / ASSESSMENT AND PLAN / ED COURSE  Pertinent labs & imaging results that were available during my care of the patient  were reviewed by me and considered in my medical decision making (see chart for details).   Patient presented to the emergency department today because of concerns for weakness, falls and bruising.  On exam patient is jaundiced with scleral icterus.  He has multiple large ecchymosis over his body.  Blood work today does show significant drop in his hemoglobin from blood work performed a few days ago.  Guaiac was positive.  At this time I question whether anemia is due to hematomas versus GI loss.  Patient's LFTs are also elevated raising concern for alcoholic hepatitis.  Patient states that he did take a Percocet earlier in the day.  Discussed with Dr. Norma Fredrickson with GI.  Will plan on starting medications to help if GI bleed is from varices.  Additionally the patient was complaining of some right hip pain.  Low suspicion for acute fracture however x-ray showed possible fracture.  Will obtain CT to evaluate.  Discussed with hospitalist for admission.  ____________________________________________   FINAL CLINICAL IMPRESSION(S) / ED DIAGNOSES  Final diagnoses:  Elevated LFTs  Right hip pain  Alcohol abuse     Note: This dictation was prepared with Dragon dictation. Any transcriptional errors that result from this process are unintentional     Phineas Semen, MD 01/24/20 1606

## 2020-01-23 NOTE — ED Notes (Signed)
Another RN, Shanda Bumps is taking pt to C-POD and giving report to C-POD nurse.

## 2020-01-23 NOTE — ED Notes (Addendum)
Provider messaged:  Jodie Echevaria. With this pt, I noticed he is NPO. The ER provider was letting him drink water, and he is requesting more water, and I was wondering if he can just have water, but no food. Also, there is an order for "ativan for alcohol withdrawls" but no CIWA orders. His CIWA is not high, only a 2.  Blood transfusion rate increased to 114mL/hr

## 2020-01-24 ENCOUNTER — Inpatient Hospital Stay: Payer: Self-pay | Admitting: Anesthesiology

## 2020-01-24 ENCOUNTER — Inpatient Hospital Stay: Payer: Self-pay

## 2020-01-24 ENCOUNTER — Encounter: Payer: Self-pay | Admitting: Family Medicine

## 2020-01-24 DIAGNOSIS — M25559 Pain in unspecified hip: Secondary | ICD-10-CM | POA: Diagnosis present

## 2020-01-24 DIAGNOSIS — R7401 Elevation of levels of liver transaminase levels: Secondary | ICD-10-CM | POA: Diagnosis present

## 2020-01-24 DIAGNOSIS — W19XXXA Unspecified fall, initial encounter: Secondary | ICD-10-CM | POA: Diagnosis present

## 2020-01-24 DIAGNOSIS — W19XXXD Unspecified fall, subsequent encounter: Secondary | ICD-10-CM

## 2020-01-24 DIAGNOSIS — E876 Hypokalemia: Secondary | ICD-10-CM | POA: Diagnosis present

## 2020-01-24 DIAGNOSIS — E871 Hypo-osmolality and hyponatremia: Secondary | ICD-10-CM | POA: Diagnosis present

## 2020-01-24 DIAGNOSIS — R296 Repeated falls: Secondary | ICD-10-CM | POA: Diagnosis present

## 2020-01-24 DIAGNOSIS — K922 Gastrointestinal hemorrhage, unspecified: Secondary | ICD-10-CM | POA: Diagnosis present

## 2020-01-24 LAB — COMPREHENSIVE METABOLIC PANEL
ALT: 80 U/L — ABNORMAL HIGH (ref 0–44)
ALT: 77 U/L — ABNORMAL HIGH (ref 0–44)
AST: 259 U/L — ABNORMAL HIGH (ref 15–41)
AST: 263 U/L — ABNORMAL HIGH (ref 15–41)
Albumin: 2.7 g/dL — ABNORMAL LOW (ref 3.5–5.0)
Albumin: 2.8 g/dL — ABNORMAL LOW (ref 3.5–5.0)
Alkaline Phosphatase: 46 U/L (ref 38–126)
Alkaline Phosphatase: 46 U/L (ref 38–126)
Anion gap: 8 (ref 5–15)
Anion gap: 10 (ref 5–15)
BUN: 23 mg/dL — ABNORMAL HIGH (ref 6–20)
BUN: 22 mg/dL — ABNORMAL HIGH (ref 6–20)
CO2: 30 mmol/L (ref 22–32)
CO2: 26 mmol/L (ref 22–32)
Calcium: 6.5 mg/dL — ABNORMAL LOW (ref 8.9–10.3)
Calcium: 6.5 mg/dL — ABNORMAL LOW (ref 8.9–10.3)
Chloride: 92 mmol/L — ABNORMAL LOW (ref 98–111)
Chloride: 92 mmol/L — ABNORMAL LOW (ref 98–111)
Creatinine, Ser: 1 mg/dL (ref 0.61–1.24)
Creatinine, Ser: 0.91 mg/dL (ref 0.61–1.24)
GFR calc Af Amer: >60 mL/min (ref >60)
GFR calc Af Amer: >60 mL/min (ref >60)
GFR calc non Af Amer: >60 mL/min (ref >60)
GFR calc non Af Amer: >60 mL/min (ref >60)
Glucose, Bld: 129 mg/dL — ABNORMAL HIGH (ref 70–99)
Glucose, Bld: 126 mg/dL — ABNORMAL HIGH (ref 70–99)
Potassium: 3.7 mmol/L (ref 3.5–5.1)
Potassium: 3.7 mmol/L (ref 3.5–5.1)
Sodium: 130 mmol/L — ABNORMAL LOW (ref 135–145)
Sodium: 128 mmol/L — ABNORMAL LOW (ref 135–145)
Total Bilirubin: 13.6 mg/dL — ABNORMAL HIGH (ref 0.3–1.2)
Total Bilirubin: 14 mg/dL — ABNORMAL HIGH (ref 0.3–1.2)
Total Protein: 5.7 g/dL — ABNORMAL LOW (ref 6.5–8.1)
Total Protein: 5.8 g/dL — ABNORMAL LOW (ref 6.5–8.1)

## 2020-01-24 LAB — PHOSPHORUS: Phosphorus: UNDETERMINED mg/dL (ref 2.5–4.6)

## 2020-01-24 LAB — HEMOGLOBIN AND HEMATOCRIT, BLOOD
HCT: 17.9 % — ABNORMAL LOW (ref 39.0–52.0)
HCT: 17.3 % — ABNORMAL LOW (ref 39.0–52.0)
Hemoglobin: 6.1 g/dL — ABNORMAL LOW (ref 13.0–17.0)
Hemoglobin: 6.2 g/dL — ABNORMAL LOW (ref 13.0–17.0)

## 2020-01-24 LAB — CBC
HCT: 18.3 % — ABNORMAL LOW (ref 39.0–52.0)
HCT: 18.1 % — ABNORMAL LOW (ref 39.0–52.0)
Hemoglobin: 6.6 g/dL — ABNORMAL LOW (ref 13.0–17.0)
Hemoglobin: 6.4 g/dL — ABNORMAL LOW (ref 13.0–17.0)
MCH: 33.3 pg (ref 26.0–34.0)
MCH: 34 pg (ref 26.0–34.0)
MCHC: 36.1 g/dL — ABNORMAL HIGH (ref 30.0–36.0)
MCHC: 35.4 g/dL (ref 30.0–36.0)
MCV: 92.4 fL (ref 80.0–100.0)
MCV: 96.3 fL (ref 80.0–100.0)
Platelets: 107 10*3/uL — ABNORMAL LOW (ref 150–400)
Platelets: 107 10*3/uL — ABNORMAL LOW (ref 150–400)
RBC: 1.98 MIL/uL — ABNORMAL LOW (ref 4.22–5.81)
RBC: 1.88 MIL/uL — ABNORMAL LOW (ref 4.22–5.81)
RDW: 19.9 % — ABNORMAL HIGH (ref 11.5–15.5)
RDW: 20.1 % — ABNORMAL HIGH (ref 11.5–15.5)
WBC: 9.8 10*3/uL (ref 4.0–10.5)
WBC: 10.5 10*3/uL (ref 4.0–10.5)
nRBC: 0.7 % — ABNORMAL HIGH (ref 0.0–0.2)
nRBC: 1 % — ABNORMAL HIGH (ref 0.0–0.2)

## 2020-01-24 LAB — APTT: aPTT: 36 seconds (ref 24–36)

## 2020-01-24 LAB — HIV ANTIBODY (ROUTINE TESTING W REFLEX): HIV Screen 4th Generation wRfx: NONREACTIVE

## 2020-01-24 LAB — MAGNESIUM: Magnesium: 1.8 mg/dL (ref 1.7–2.4)

## 2020-01-24 LAB — PREPARE RBC (CROSSMATCH)

## 2020-01-24 LAB — PROTIME-INR
INR: 1.6 — ABNORMAL HIGH (ref 0.8–1.2)
Prothrombin Time: 18 seconds — ABNORMAL HIGH (ref 11.4–15.2)

## 2020-01-24 MED ORDER — FUROSEMIDE 10 MG/ML IJ SOLN
20.0000 mg | Freq: Once | INTRAMUSCULAR | Status: DC
  Filled 2020-01-24: qty 4

## 2020-01-24 MED ORDER — PROPOFOL 500 MG/50ML IV EMUL
INTRAVENOUS | Status: AC
  Filled 2020-01-24: qty 50

## 2020-01-24 MED ORDER — SODIUM CHLORIDE 0.9% IV SOLUTION
Freq: Once | INTRAVENOUS | Status: DC
  Filled 2020-01-24: qty 250

## 2020-01-24 MED ORDER — THIAMINE HCL 100 MG/ML IJ SOLN
100.0000 mg | Freq: Every day | INTRAMUSCULAR | Status: DC
  Administered 2020-01-24 – 2020-01-25 (×2): 100 mg via INTRAVENOUS
  Filled 2020-01-24 (×3): qty 2

## 2020-01-24 MED ORDER — LORAZEPAM 1 MG PO TABS
1.0000 mg | ORAL_TABLET | ORAL | Status: AC | PRN
  Filled 2020-01-24: qty 1

## 2020-01-24 MED ORDER — ADULT MULTIVITAMIN W/MINERALS CH
1.0000 | ORAL_TABLET | Freq: Every day | ORAL | Status: DC
  Administered 2020-01-24 – 2020-01-29 (×6): 1 via ORAL
  Filled 2020-01-24 (×6): qty 1

## 2020-01-24 MED ORDER — LORAZEPAM 2 MG/ML IJ SOLN
1.0000 mg | INTRAMUSCULAR | Status: AC | PRN
  Administered 2020-01-24 (×4): 2 mg via INTRAVENOUS
  Administered 2020-01-24 – 2020-01-25 (×2): 1 mg via INTRAVENOUS
  Administered 2020-01-25 – 2020-01-26 (×3): 2 mg via INTRAVENOUS
  Administered 2020-01-26: 1 mg via INTRAVENOUS
  Administered 2020-01-27 (×2): 2 mg via INTRAVENOUS
  Filled 2020-01-24 (×12): qty 1

## 2020-01-24 MED ORDER — THIAMINE HCL 100 MG PO TABS
100.0000 mg | ORAL_TABLET | Freq: Every day | ORAL | Status: DC
  Administered 2020-01-26 – 2020-01-29 (×4): 100 mg via ORAL
  Filled 2020-01-24 (×5): qty 1

## 2020-01-24 MED ORDER — FOLIC ACID 1 MG PO TABS
1.0000 mg | ORAL_TABLET | Freq: Every day | ORAL | Status: DC
  Administered 2020-01-24 – 2020-01-29 (×6): 1 mg via ORAL
  Filled 2020-01-24 (×6): qty 1

## 2020-01-24 MED ORDER — PROPOFOL 10 MG/ML IV BOLUS
INTRAVENOUS | Status: AC
  Filled 2020-01-24: qty 20

## 2020-01-24 MED ORDER — DEXMEDETOMIDINE HCL IN NACL 400 MCG/100ML IV SOLN
0.2000 ug/kg/h | INTRAVENOUS | Status: DC
  Administered 2020-01-24: 0.2 ug/kg/h via INTRAVENOUS
  Administered 2020-01-25: 0.599 ug/kg/h via INTRAVENOUS
  Administered 2020-01-25: 0.2 ug/kg/h via INTRAVENOUS
  Administered 2020-01-26: 0.9 ug/kg/h via INTRAVENOUS
  Administered 2020-01-26: 0.8 ug/kg/h via INTRAVENOUS
  Administered 2020-01-26: 0.4 ug/kg/h via INTRAVENOUS
  Filled 2020-01-24 (×7): qty 100

## 2020-01-24 MED ORDER — SODIUM CHLORIDE 0.9 % IV BOLUS
1000.0000 mL | Freq: Once | INTRAVENOUS | Status: AC
  Administered 2020-01-24: 1000 mL via INTRAVENOUS

## 2020-01-24 MED ORDER — SODIUM CHLORIDE 0.9 % IV SOLN
2.0000 g | INTRAVENOUS | Status: DC
  Administered 2020-01-24 – 2020-01-27 (×4): 2 g via INTRAVENOUS
  Filled 2020-01-24: qty 2
  Filled 2020-01-24 (×4): qty 20

## 2020-01-24 MED ORDER — FUROSEMIDE 10 MG/ML IJ SOLN
20.0000 mg | Freq: Once | INTRAMUSCULAR | Status: AC
  Administered 2020-01-24: 20 mg via INTRAVENOUS
  Filled 2020-01-24: qty 4

## 2020-01-24 MED ORDER — METOPROLOL SUCCINATE ER 50 MG PO TB24
25.0000 mg | ORAL_TABLET | Freq: Every day | ORAL | Status: DC
  Administered 2020-01-24 – 2020-01-28 (×4): 25 mg via ORAL
  Filled 2020-01-24 (×4): qty 1

## 2020-01-24 NOTE — ED Notes (Signed)
Pt awake, confused at times. Complaining of pain all over. Monitor show sinus rhythm of HR 113. IV fluids infusing well without any redness or swelling. IV site intact. Pt with no respiratory distress noted at this time, will continue to monitor respiratory status.

## 2020-01-24 NOTE — Consult Note (Signed)
ORTHOPAEDIC CONSULTATION  REQUESTING PHYSICIAN: Kendell Bane, MD  Chief Complaint:   Right hip pain.  History of Present Illness: [name] is a 49 y.o. male with a history of significant alcoholism who apparently has fallen several times over the past few weeks, most recently yesterday afternoon.  He believes that he simply lost his balance and fell, but was drunk at the time.  He was brought to the emergency room complaining of right hip pain.  X-rays were suggestive of a possible nondisplaced right femoral neck fracture, so orthopedics was consulted.  The patient was noted to have multiple other medical issues, including significant anemia, elevated coag levels, elevated LFTs, and an elevated alcohol level, and so is being admitted for further medical work-up and stabilization/detoxification.  Regarding his right hip, the patient complains of significant pain in the lateral aspect of the hip which is worse if he tries to lie on his right side.  He also has difficulty lifting his leg actively due to pain.  He denies any numbness or paresthesias down his leg to his foot.  Past Medical History:  Diagnosis Date  . Polysubstance abuse (HCC)    History reviewed. No pertinent surgical history. Social History   Socioeconomic History  . Marital status: Single    Spouse name: Not on file  . Number of children: Not on file  . Years of education: Not on file  . Highest education level: Not on file  Occupational History  . Not on file  Tobacco Use  . Smoking status: Never Smoker  . Smokeless tobacco: Never Used  Vaping Use  . Vaping Use: Never used  Substance and Sexual Activity  . Alcohol use: Yes    Comment: 5-6 every other day  . Drug use: Not Currently  . Sexual activity: Not on file  Other Topics Concern  . Not on file  Social History Narrative  . Not on file   Social Determinants of Health    Financial Resource Strain:   . Difficulty of Paying Living Expenses: Not on file  Food Insecurity:   . Worried About Programme researcher, broadcasting/film/video in the Last Year: Not on file  . Ran Out of Food in the Last Year: Not on file  Transportation Needs:   . Lack of Transportation (Medical): Not on file  . Lack of Transportation (Non-Medical): Not on file  Physical Activity:   . Days of Exercise per Week: Not on file  . Minutes of Exercise per Session: Not on file  Stress:   . Feeling of Stress : Not on file  Social Connections:   . Frequency of Communication with Friends and Family: Not on file  . Frequency of Social Gatherings with Friends and Family: Not on file  . Attends Religious Services: Not on file  . Active Member of Clubs or Organizations: Not on file  . Attends Banker Meetings: Not on file  . Marital Status: Not on file   History reviewed. No pertinent family history. No Known Allergies Prior to Admission medications   Medication Sig Start Date End Date Taking? Authorizing Provider  atorvastatin (LIPITOR) 20 MG tablet Take 20 mg by mouth at bedtime. 11/08/19  Yes [provider]  doxycycline (VIBRAMYCIN) 100 MG capsule Take 100 mg by mouth 2 (two) times daily. 01/10/20  Yes [provider]  metoprolol succinate (TOPROL-XL) 25 MG 24 hr tablet Take 25 mg by mouth daily. 01/11/20  Yes [provider]   CT  PELVIS WO CONTRAST  Result Date: 01/24/2020 CLINICAL DATA:  Pain, question fracture EXAM: CT PELVIS WITHOUT CONTRAST TECHNIQUE: Multidetector CT imaging of the pelvis was performed following the standard protocol without intravenous contrast. COMPARISON:  None. FINDINGS: Urinary Tract: The visualized distal ureters and bladder appear unremarkable. Bowel: No bowel wall thickening, distention or surrounding inflammation identified within the pelvis. Vascular/Lymphatic: No enlarged pelvic lymph nodes identified. No significant vascular findings.  Reproductive: The prostate is unremarkable. Other: Bilateral subcutaneous edema seen around the hips. Musculoskeletal: No fracture or dislocation. 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 There is a large heterogeneous hyperdense areas seen throughout the right gluteal musculature with expansion most notable within the gluteus medius muscle belly, likely intramuscular hematoma. There is also a small subcutaneous hyperdense collections seen overlying the left gluteal musculature, likely superficial hematomas. IMPRESSION: No acute osseous abnormality. Large intramuscular hematoma within the right gluteal musculature. Overlying subcutaneous edema and skin thickening. superficial subcutaneous small hematoma seen overlying the left gluteal musculature. Electronically Signed   By: Jonna Clark M.D.   On: 01/24/2020 02:45   DG Hip Unilat W or Wo Pelvis 2-3 Views Right  Result Date: 01/23/2020 CLINICAL DATA:  Right hip pain post fall EXAM: DG HIP (WITH OR WITHOUT PELVIS) 2-3V RIGHT COMPARISON:  None. FINDINGS: There is marked lateral soft tissue swelling of the right hip. Suspicious for overlying contusive change and/or hematoma. There is a questionable lucency along the posterolateral aspect of the right femoral head neck junction which could reflect a nondisplaced fracture or some projectional periarticular spurring. Additional suspicious lucency is seen along the junction of the left inferior pubic ramus and pubic body best seen on the lateral hip radiograph. Could correlate for point tenderness in this location. There is a background of diffuse degenerative change in the lower spine, hips and pelvis. Enthesopathic changes noted as well at the iliac crest, ischial tuberosities and lesser trochanters bilaterally. IMPRESSION: 1. Marked lateral soft tissue swelling of the right hip. Suspicious for overlying contusive change and/or hematoma. 2.  Questionable lucency along the posterolateral aspect of the right femoral head neck junction which could reflect a nondisplaced fracture or some projectional periarticular spurring. 3. Additional suspicious lucency along the junction of the left inferior pubic ramus and pubic body best seen on the lateral radiograph. 4. Given these latter findings, further evaluation with a bony pelvis CT could be obtained for more definitive assessment. Electronically Signed   By: Kreg Shropshire M.D.   On: 01/23/2020 21:55   US Abdomen Limited RUQ  Result Date: 01/23/2020 CLINICAL DATA:  Elevated liver enzymes EXAM: ULTRASOUND ABDOMEN LIMITED RIGHT UPPER QUADRANT COMPARISON:  None. FINDINGS: Gallbladder: No gallstones or wall thickening visualized. No sonographic Murphy sign noted by sonographer. Common bile duct: Diameter: 5.9 mm Liver: Increased echogenicity seen throughout the liver parenchyma. No focal hepatic lesion. No biliary ductal dilatation. The portal vein is patent, with reversal of flow however and recanalized umbilical vein. Other: None. IMPRESSION: Hepatic steatosis Reversal of flow in the portal vein with findings suggestive of portal hypertension Electronically Signed   By: Jonna Clark M.D.   On: 01/23/2020 21:21    Positive ROS: All other systems have been reviewed and were otherwise negative with the exception of those mentioned in the HPI and as above.  Physical Exam: General:  Alert, no acute distress Psychiatric:  Patient is competent for consent with normal mood and affect   Cardiovascular:  No pedal edema Respiratory:  No wheezing, non-labored breathing GI:  Abdomen is soft and non-tender Skin:  No lesions in the area of chief complaint Neurologic:  Sensation intact distally Lymphatic:  No axillary or cervical lymphadenopathy  Orthopedic Exam:  Orthopedic examination is limited to the right hip and lower extremity.  There is moderate swelling around the lateral and posterior lateral aspects  of the right hip with extensive ecchymosis extending down the posterior aspect of his thigh into his calf and foot.  He has moderate tenderness to palpation over the lateral aspect of the hip and buttock region.  He is able to tolerate gentle logrolling of the hip and leg, but has pain with any attempted active hip flexion.  He is able dorsiflex and plantarflex his toes and ankle.  Sensation is intact to light touch to all distributions.  He has good capillary refill to his right foot.  X-rays:  X-rays of the pelvis and right hip are available for review and have been reviewed by myself.  The findings are as described above.  A CT scan of the pelvis also is available for review and has been reviewed by myself.  This study demonstrates evidence of early degenerative changes of the right hip as well as a large hematoma involving the gluteal musculature, but no acute fractures of the right hip or proximal femur are identified.  Assessment: Large intramuscular hematoma without evidence of any fractures, right hip.  Plan: The treatment options of been discussed with the patient.  Given the fact that there is no fracture, I do not feel that any surgical intervention is necessary at this time.  Most likely the majority of his pain is coming from the large intramuscular hematoma that was identified on CT scan.  This is something that should be able to resolve on its own over time.  However, once his coag abnormalities have resolved, he might benefit from an attempted percutaneous drainage of the intramuscular hematoma by interventional radiology.  The patient may be mobilized as symptoms permit, weightbearing as tolerated on the right leg.  Pain medication may be provided as felt to be appropriate medically.  Thank you for asking me to participate in the care of this most unfortunate man.  I will sign off now, as it does not appear to be anything required orthopedically at this time.  However, if you do find  further need for my services, please reconsult me.   Maryagnes Amos, MD  Beeper #:  [phone]  01/24/2020 1:37 PM

## 2020-01-24 NOTE — ED Notes (Signed)
Admitting MD Shahmehdi notified about needing CIWA orders at this time.

## 2020-01-24 NOTE — Anesthesia Preprocedure Evaluation (Signed)
Anesthesia Evaluation Anesthesia Physical Anesthesia Plan Anesthesia Quick Evaluation  

## 2020-01-24 NOTE — ED Notes (Signed)
Bed sheets changed.

## 2020-01-24 NOTE — TOC Initial Note (Signed)
Transition of Care [facility]) - Initial/Assessment Note    Patient Details  Name: [name] MRN: [id number] Date of Birth: 07/13/1970  [facility] CM/SW Contact:    Barnard Cellar, RN Phone Number: 01/24/2020, 10:50 AM  Clinical Narrative:                 Spoke to patient at bedside. States he lives with his girlfriend who is wc dependent but assists him as needed. 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 Pays out of pocket for MD appointments as well as his BP and Cholesterol medications. Has PCP of [facility]. Reports his girlfriend received inheritance money which she assists him with his bills. TOC RN CM provided patient with application for [facility], Substance Abuse resources and [employer] including DSS. Provided patient with "[employer]" with all community outpatient resources. Patient reports he is ready to quit drinking now and this hospitalization has scared him into quitting.   Expected Discharge Plan: Home/Self Care Barriers to Discharge: Continued Medical Work up   Patient Goals and CMS Choice Patient states their goals for this hospitalization and ongoing recovery are:: Get assistance with ETOH treatment      Expected Discharge Plan and Services Expected Discharge Plan: Home/Self Care       Living arrangements for the past 2 months: Single Family Home                                      Prior Living Arrangements/Services Living arrangements for the past 2 months: Single Family Home Lives with:: Roommate, Significant Other Patient language and need for interpreter reviewed:: Yes Do you feel safe going back to the place where you live?: Yes      Need for Family Participation in Patient Care: Yes (Comment) Care giver support system in place?: Yes (comment)    Criminal Activity/Legal Involvement Pertinent to Current Situation/Hospitalization: No - Comment as needed  Activities of Daily Living      Permission Sought/Granted                  Emotional Assessment Appearance:: Appears stated age Attitude/Demeanor/Rapport: Ambitious, Engaged Affect (typically observed): Accepting Orientation: : Oriented to Self, Oriented to Place, Oriented to  Time, Oriented to Situation Alcohol / Substance Use: Alcohol Use Psych Involvement: No (comment)  Admission diagnosis:  Acute blood loss anemia [D62] Patient Active Problem List   Diagnosis Date Noted  . GIB (gastrointestinal bleeding) 01/24/2020  . Transaminitis 01/24/2020  . Hyponatremia 01/24/2020  . Hypokalemia 01/24/2020  . Falls 01/24/2020  . Hip pain 01/24/2020  . Acute blood loss anemia 01/23/2020  . Substance induced mood disorder (HCC) 05/04/2017   PCP:  Armando Gang, FNP Pharmacy:   [facility] [address], [place] - [address] [place] [address] Phone: [phone] Fax: [phone]     Social Determinants of Health (SDOH) Interventions    Readmission Risk Interventions No flowsheet data found.

## 2020-01-24 NOTE — ED Notes (Signed)
Report given to Katie RN.

## 2020-01-24 NOTE — ED Notes (Signed)
This RN and Sam RN at bedside at this time. Introduced self to pt at this time. Visualized bruising bilaterally on legs, left side of face, neck, chest, and left arm. Pt visualized to be jaundiced. CIWA completed at this time. Pt denies further needs at this time.

## 2020-01-24 NOTE — ED Notes (Signed)
GI MD [facility] regarding consult.

## 2020-01-24 NOTE — ED Notes (Signed)
NP Ouma contacted about pt's temp, previous orders from admitting MD, and need for blood transfusion. See orders for NS bolus. Per NP, give blood after bolus.

## 2020-01-24 NOTE — ED Notes (Signed)
Pt repositioned in bed by this RN and Bennetta Laos.

## 2020-01-24 NOTE — ED Notes (Signed)
Per MD Shahmehdi, continue to hold blood due to oral temp being 100.8. Recheck temp in one hour.

## 2020-01-24 NOTE — ED Notes (Addendum)
Per MD Shahmehdi, hold blood until pt is fever free. Pt given tylenol 650mg  by this rn at 1257. Blood returned to blood bank by this RN within the 30 minute time frame.

## 2020-01-24 NOTE — ED Notes (Signed)
Per Anna Genre, NP, patient does not qualify for stepdown care.

## 2020-01-24 NOTE — Progress Notes (Signed)
PROGRESS NOTE    Patient: [name]                            PCP: Armando Gang, FNP                    DOB: Aug 23, 1970            DOA: 01/23/2020 DUC:[id number]             DOS: 01/24/2020, 8:57 AM   LOS: 1 day   Date of Service: The patient was seen and examined on 01/24/2020  Subjective:   The patient was seen and examined this Am. Complaining of hip pain.very uncomfortable  Cannot find a comfortable position in bed. No signs of withdrawal  Admitting to drinking too much would like to go to detox.  Brief Narrative:   [name]  is a 49 y.o. Caucasian male with a known history of polysubstance abuse including alcohol abuse who presented to the emergency room with acute onset of increased generalized weakness and a mechanical fall.  He believes that he lost balance and fell with subsequent right hip pain he usually drinks 5-6 beer at night.    ED: Heart rate was 106 and blood pressure 109/43 with pulse oximetry of 92 and later 96% on room air and otherwise normal vital signs.   Labs revealed hypokalemia of 3.3, anion gap of 15 albumin of 3 total 36.4, AST of 293 and ALT 83 with alk phos of 53 and total bili of 14.2. Ammonia level was 42.   CBC showed no leukocytosis of 11.6 and significant anemia with hemoglobin 6.7 hematocrit 19.1 compared to 10.4 and 29.5 on 01/20/2020 with thrombocytopenia of 123.  INR is 1.5 and PT 17.3.   COVID-19 PCR came back negative.  Alcohol level was 296.Marland Kitchen   Stool Hemoccult was weakly positive.   Right upper quadrant ultrasound revealed hepatic steatosis with findings suggestive of portal hypertension. Right hip CT showed marked left lower soft tissue swelling suspicious for overlying contusive change and/or hematoma and questionable lucency along the posterolateral aspect of the right femoral head neck junction that could reflect a nondisplaced fracture or some projectional periarticular spurring. There is a suspicious lucency along the  junction of the left inferior pubic ramus and pubic body best seen on the lateral radiograph.  The patient was given IV Protonix bolus and infusion, IV octreotide bolus and infusion, 1 g of IV Rocephin and IV Mucomyst   Assessment & Plan:   Active Problems:   Substance induced mood disorder (HCC)   Acute blood loss anemia   Acute symptomatic blood loss anemia secondary to GI bleeding. -Status post transfusion with 2 units of PRBC overnight, hemoglobin this morning still at 6.4, no signs of bleeding Another 2 units of PRBC transfusion will be resumed -Hemoccult positive, no active signs of bleeding Hemoglobin on admission 6.7, 6.6,>>> 2 U PRBC >> 6.4 >>>  -We will follow posttransfusion H&H. -We will continue him on IV Protonix drip and octreotide drip. -GI consulted -Dr. Norma Fredrickson was notified and is aware about the patient. -GI following --- appreciate and close follow-up and recommendations   Alcoholic hepatitis with early hepatic cell failure and current alcohol intoxication. -Initiating alcohol detox protocol CIWA -We will follow LFTs with hydration with IV normal saline. -Monitoring for severe withdrawal, if needed will utilize Librium versus Precedex  3.  Hypokalemia. -Monitoring potassium magnesium level, and repleting  accordingly.  4.  Hyponatremia. -Likely due to beer azotemia, monitoring closely  Fall with subsequent multiple bruises / contusions /hematoma/ruled out the possibility of  Rt femoral head neck fracture, inferior pubic ramus fracture  -Orthopedic consultation will be obtained.... Appreciate input -hip CT scan >> revealing extensive hematoma negative for any acute fractures -N otified Dr. Roland Rack about the patient..  Dyslipidemia. -Statin therapy will be held off given acute hepatitis.  7.  Essential hypertension. -We will continue Toprol-XL.   DVT prophylaxis.-SCDs. -Medical prophylaxis currently contraindicated due to his associated  alcohol-related chronic liver disease and subsequent coagulopathy... Also extensive hematoma CODE STATUS: Full code Family communication -no family member present at bedside-discussed with the patient  Status is: Inpatient  Remains inpatient appropriate because:Hemodynamically unstable, Persistent severe electrolyte disturbances, Ongoing diagnostic testing needed not appropriate for outpatient work up, Unsafe d/c plan, IV treatments appropriate due to intensity of illness or inability to take PO and Inpatient level of care appropriate due to severity of illness   Dispo: The patient is from: Home  Anticipated d/c is to: Home  Anticipated d/c date is: 2 days  Patient currently is not medically stable to d/c.      Consultants:GI/ Ortho      Procedures:   No admission procedures for hospital encounter.    Antimicrobials:  Anti-infectives (From admission, onward)   Start     Dose/Rate Route Frequency Ordered Stop   01/23/20 2045  cefTRIAXone (ROCEPHIN) 1 g in sodium chloride 0.9 % 100 mL IVPB        1 g 200 mL/hr over 30 Minutes Intravenous  Once 01/23/20 2042 01/23/20 2216       Medication:  . metoprolol succinate  25 mg Oral Daily  . [START ON 01/27/2020] pantoprazole  40 mg Intravenous Q12H    acetaminophen **OR** acetaminophen, LORazepam, morphine injection, ondansetron **OR** ondansetron (ZOFRAN) IV, traZODone   Objective:   Vitals:   01/24/20 0630 01/24/20 0700 01/24/20 0720 01/24/20 0730  BP: (!) 117/59 (!) 144/89 (!) 144/89 129/63  Pulse: (!) 112 (!) 105 (!) 107 (!) 107  Resp: (!) 24 (!) 33  17  Temp:      TempSrc:      SpO2: 92% 96%  98%    Intake/Output Summary (Last 24 hours) at 01/24/2020 0857 Last data filed at 01/24/2020 0725 Gross per 24 hour  Intake 2060.16 ml  Output 1120 ml  Net 940.16 ml   There were no vitals filed for this visit.   Examination:   Physical Exam  Constitution:  Alert,  cooperative, no distress,  Appears calm and comfortable  Psychiatric: Normal and stable mood and affect, cognition intact,   HEENT: Normocephalic, PERRL, otherwise with in Normal limits /facial, neck hematoma noted Chest:Chest symmetric Cardio vascular:  S1/S2, RRR, No murmure, No Rubs or Gallops  pulmonary: Clear to auscultation bilaterally, respirations unlabored, negative wheezes / crackles Abdomen: Soft, non-tender, non-distended, bowel sounds,no masses, no organomegaly Muscular skeletal: Limited exam - in bed, able to move all 4 extremities, Normal strength,  Extensive scattered lower extremity hematoma, hip pain-range of motion therefore limited Neuro: CNII-XII intact. , normal motor and sensation, reflexes intact  Extremities: No pitting edema lower extremities, +2 pulses  Skin: Extensive bruises, hematoma on lower extremities, facial, neck area dry, warm to touch, negative for any Rashes, No open wounds Wounds: None visible-per nursing documentation    ------------------------------------------------------------------------------------------------------------------------------------------    LABs:  CBC Latest Ref Rng & Units 01/24/2020 01/23/2020 01/20/2020  WBC 4.0 -  10.5 K/uL 9.8 11.6(H) 8.5  Hemoglobin 13.0 - 17.0 g/dL 6.6(L) 6.7(L) 10.4(L)  Hematocrit 39 - 52 % 18.3(L) 19.1(L) 29.5(L)  Platelets 150 - 400 K/uL 107(L) 123(L) 64(L)   CMP Latest Ref Rng & Units 01/24/2020 01/23/2020 01/20/2020  Glucose 70 - 99 mg/dL 129(H) 140(H) 119(H)  BUN 6 - 20 mg/dL 23(H) 27(H) 28(H)  Creatinine 0.61 - 1.24 mg/dL 1.00 1.06 1.39(H)  Sodium 135 - 145 mmol/L 130(L) 129(L) 132(L)  Potassium 3.5 - 5.1 mmol/L 3.7 3.3(L) 3.4(L)  Chloride 98 - 111 mmol/L 92(L) 89(L) 94(L)  CO2 22 - 32 mmol/L $RemoveB'30 25 25  'XjEYGaAy$ Calcium 8.9 - 10.3 mg/dL 6.5(L) 7.0(L) 7.3(L)  Total Protein 6.5 - 8.1 g/dL 5.7(L) 6.4(L) -  Total Bilirubin 0.3 - 1.2 mg/dL 13.6(H) 14.2(H) -  Alkaline Phos 38 - 126 U/L 46 53 -  AST 15 - 41 U/L 259(H)  293(H) -  ALT 0 - 44 U/L 80(H) 83(H) -       Micro Results Recent Results (from the past 240 hour(s))  SARS Coronavirus 2 by RT PCR (hospital order, performed in [facility] hospital lab) Nasopharyngeal Nasopharyngeal Swab     Status: None   Collection Time: 01/23/20  7:35 PM   Specimen: Nasopharyngeal Swab  Result Value Ref Range Status   SARS Coronavirus 2 NEGATIVE NEGATIVE Final    Comment: (NOTE) SARS-CoV-2 target nucleic acids are NOT DETECTED.  The SARS-CoV-2 RNA is generally detectable in upper and lower respiratory specimens during the acute phase of infection. The lowest concentration of SARS-CoV-2 viral copies this assay can detect is 250 copies / mL. A negative result does not preclude SARS-CoV-2 infection and should not be used as the sole basis for treatment or other patient management decisions.  A negative result may occur with improper specimen collection / handling, submission of specimen other than nasopharyngeal swab, presence of viral mutation(s) within the areas targeted by this assay, and inadequate number of viral copies (<250 copies / mL). A negative result must be combined with clinical observations, patient history, and epidemiological information.  Fact Sheet for Patients:   StrictlyIdeas.no  Fact Sheet for Healthcare Providers: BankingDealers.co.za  This test is not yet approved or  cleared by the [place] FDA and has been authorized for detection and/or diagnosis of SARS-CoV-2 by FDA under an Emergency Use Authorization (EUA).  This EUA will remain in effect (meaning this test can be used) for the duration of the COVID-19 declaration under Section 564(b)(1) of the Act, 21 U.S.C. section 360bbb-3(b)(1), unless the authorization is terminated or revoked sooner.  Performed at [facility], [address] [place], [place] [address]     Radiology Reports CT PELVIS WO  CONTRAST  Result Date: 01/24/2020 CLINICAL DATA:  Pain, question fracture EXAM: CT PELVIS WITHOUT CONTRAST TECHNIQUE: Multidetector CT imaging of the pelvis was performed following the standard protocol without intravenous contrast. COMPARISON:  None. FINDINGS: Urinary Tract: The visualized distal ureters and bladder appear unremarkable. Bowel: No bowel wall thickening, distention or surrounding inflammation identified within the pelvis. Vascular/Lymphatic: No enlarged pelvic lymph nodes identified. No significant vascular findings. Reproductive: The prostate is unremarkable. Other: Bilateral subcutaneous edema seen around the hips. Musculoskeletal: No fracture or dislocation. 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 There is a large heterogeneous hyperdense areas seen throughout the right gluteal musculature with expansion most notable within the gluteus medius muscle belly, likely intramuscular hematoma. There is also a small subcutaneous hyperdense  collections seen overlying the left gluteal musculature, likely superficial hematomas. IMPRESSION: No acute osseous abnormality. Large intramuscular hematoma within the right gluteal musculature. Overlying subcutaneous edema and skin thickening. superficial subcutaneous small hematoma seen overlying the left gluteal musculature. Electronically Signed   By: Prudencio Pair M.D.   On: 01/24/2020 02:45   DG Hip Unilat W or Wo Pelvis 2-3 Views Right  Result Date: 01/23/2020 CLINICAL DATA:  Right hip pain post fall EXAM: DG HIP (WITH OR WITHOUT PELVIS) 2-3V RIGHT COMPARISON:  None. FINDINGS: There is marked lateral soft tissue swelling of the right hip. Suspicious for overlying contusive change and/or hematoma. There is a questionable lucency along the posterolateral aspect of the right femoral head neck junction which could reflect a nondisplaced fracture or some projectional periarticular spurring.  Additional suspicious lucency is seen along the junction of the left inferior pubic ramus and pubic body best seen on the lateral hip radiograph. Could correlate for point tenderness in this location. There is a background of diffuse degenerative change in the lower spine, hips and pelvis. Enthesopathic changes noted as well at the iliac crest, ischial tuberosities and lesser trochanters bilaterally. IMPRESSION: 1. Marked lateral soft tissue swelling of the right hip. Suspicious for overlying contusive change and/or hematoma. 2. Questionable lucency along the posterolateral aspect of the right femoral head neck junction which could reflect a nondisplaced fracture or some projectional periarticular spurring. 3. Additional suspicious lucency along the junction of the left inferior pubic ramus and pubic body best seen on the lateral radiograph. 4. Given these latter findings, further evaluation with a bony pelvis CT could be obtained for more definitive assessment. Electronically Signed   By: Lovena Le M.D.   On: 01/23/2020 21:55   US Abdomen Limited RUQ  Result Date: 01/23/2020 CLINICAL DATA:  Elevated liver enzymes EXAM: ULTRASOUND ABDOMEN LIMITED RIGHT UPPER QUADRANT COMPARISON:  None. FINDINGS: Gallbladder: No gallstones or wall thickening visualized. No sonographic Murphy sign noted by sonographer. Common bile duct: Diameter: 5.9 mm Liver: Increased echogenicity seen throughout the liver parenchyma. No focal hepatic lesion. No biliary ductal dilatation. The portal vein is patent, with reversal of flow however and recanalized umbilical vein. Other: None. IMPRESSION: Hepatic steatosis Reversal of flow in the portal vein with findings suggestive of portal hypertension Electronically Signed   By: Prudencio Pair M.D.   On: 01/23/2020 21:21    SIGNED: Deatra James, MD, FACP, FHM. [facility],  Pager (please use [email or web address] to page/text)  If 7PM-7AM, please contact night-coverage [email or web address] 01/24/2020, 8:57 AM

## 2020-01-24 NOTE — ED Notes (Signed)
x2 blood cultures and lav tube sent on pt.

## 2020-01-24 NOTE — ED Notes (Signed)
Report received from Katie RN. Care assumed.

## 2020-01-24 NOTE — ED Notes (Signed)
Pt assisted with transfer to hospital bed by this RN and Sam RN at this time. Personal cell phone within reach. Bed in lowest, locked position. Call light within reach. No further needs stated at this time.

## 2020-01-24 NOTE — ED Notes (Signed)
Pt was given two cups of ice water plus a pitcher of ice water.

## 2020-01-24 NOTE — ED Notes (Signed)
Md Shahmehdi made aware pt continues to have fever. Current oral temp 100.3. Pt received 650mg  oral tylenol at this time.

## 2020-01-24 NOTE — Consult Note (Addendum)
Cody Minium, MD [facility]  [address] [place], [place] [address] Phone: [phone] Fax : [phone]  Consultation  Referring Provider:     Dr. Flossie Dibble Primary Care Physician:  Armando Gang, FNP Primary Gastroenterologist: Gentry Fitz         Reason for Consultation:     Anemia  Date of Admission:  01/23/2020 Date of Consultation:  01/24/2020         HPI:   [name] is a 49 y.o. male who has an extensive history of alcohol use with 5-6 beers per day.  The patient also recently had a fall and imaging showing him to have large areas of ecchymosis.  The patient's hemoglobin 4 days ago was 10.4 and then he came in yesterday at 6.7 that went down to 6.4 today.  The patient denies any rectal bleeding but states that he has had intermittent black stools.  He does report some lower abdominal cramping bilaterally. The patient's labs in regards to his liver enzymes have shown him to have an AST of 293 and an ALT of 83 on admission consistent with alcoholic hepatitis.  The patient's bilirubin is 14.2 on admission.  The patient reports that he used to drink more heavily when he was in college.  He also has a history of repeated falls which has caused him to have the bruises.  He also reports pain in his right hip.  I have now been asked to see the patient for his alcoholic hepatitis and his anemia.  Past Medical History:  Diagnosis Date  . Polysubstance abuse (HCC)     History reviewed. No pertinent surgical history.  Prior to Admission medications   Medication Sig Start Date End Date Taking? Authorizing Provider  atorvastatin (LIPITOR) 20 MG tablet Take 20 mg by mouth at bedtime. 11/08/19  Yes [provider]  doxycycline (VIBRAMYCIN) 100 MG capsule Take 100 mg by mouth 2 (two) times daily. 01/10/20  Yes [provider]  metoprolol succinate (TOPROL-XL) 25 MG 24 hr tablet Take 25 mg by mouth daily. 01/11/20  Yes [provider]    History  reviewed. No pertinent family history.   Social History   Tobacco Use  . Smoking status: Never Smoker  . Smokeless tobacco: Never Used  Vaping Use  . Vaping Use: Never used  Substance Use Topics  . Alcohol use: Yes    Comment: 5-6 every other day  . Drug use: Not Currently    Allergies as of 01/23/2020  . (No Known Allergies)    Review of Systems:    All systems reviewed and negative except where noted in HPI.   Physical Exam:  Vital signs in last 24 hours: Temp:  [97.9 F (36.6 C)-100.2 F (37.9 C)] 100.2 F (37.9 C) (09/02 1026) Pulse Rate:  [94-121] 109 (09/02 1145) Resp:  [15-37] 21 (09/02 1200) BP: (93-152)/(43-89) 114/81 (09/02 1200) SpO2:  [91 %-100 %] 97 % (09/02 1145) FiO2 (%):  [2 %] 2 % (09/02 0125) Weight:  [108.9 kg] 108.9 kg (09/02 1058)   General:   Pleasant, cooperative in NAD Head:  Normocephalic and atraumatic. Eyes:   No icterus.   Conjunctiva pink. PERRLA. Ears:  Normal auditory acuity. Neck:  Supple; no masses or thyroidomegaly Lungs: Respirations even and unlabored. Lungs clear to auscultation bilaterally.   No wheezes, crackles, or rhonchi.  Heart:  Regular rate and rhythm;  Without murmur, clicks, rubs or gallops Abdomen:  Soft, nondistended, nontender. Normal bowel sounds.  No appreciable masses or hepatomegaly.  No rebound or guarding.  Rectal:  Not performed. Msk:  Symmetrical without gross deformities.  Extremities:  Without edema, cyanosis or clubbing. Neurologic:  Alert and oriented x3;  grossly normal neurologically. Skin: Multiple areas of large bruising on his lower extremities. Cervical Nodes:  No significant cervical adenopathy. Psych:  Alert and cooperative. Normal affect.  LAB RESULTS: Recent Labs    01/23/20 1923 01/24/20 0403 01/24/20 1012  WBC 11.6* 9.8 10.5  HGB 6.7* 6.6* 6.4*  HCT 19.1* 18.3* 18.1*  PLT 123* 107* 107*   BMET Recent Labs    01/23/20 1923 01/24/20 0403 01/24/20 1012  NA 129* 130* 128*  K 3.3*  3.7 3.7  CL 89* 92* 92*  CO2 25 30 26   GLUCOSE 140* 129* 126*  BUN 27* 23* 22*  CREATININE 1.06 1.00 0.91  CALCIUM 7.0* 6.5* 6.5*   LFT Recent Labs    01/24/20 1012  PROT 5.8*  ALBUMIN 2.8*  AST 263*  ALT 77*  ALKPHOS 46  BILITOT 14.0*   PT/INR Recent Labs    01/23/20 1923 01/24/20 0403  LABPROT 17.3* 18.0*  INR 1.5* 1.6*    STUDIES: CT PELVIS WO CONTRAST  Result Date: 01/24/2020 CLINICAL DATA:  Pain, question fracture EXAM: CT PELVIS WITHOUT CONTRAST TECHNIQUE: Multidetector CT imaging of the pelvis was performed following the standard protocol without intravenous contrast. COMPARISON:  None. FINDINGS: Urinary Tract: The visualized distal ureters and bladder appear unremarkable. Bowel: No bowel wall thickening, distention or surrounding inflammation identified within the pelvis. Vascular/Lymphatic: No enlarged pelvic lymph nodes identified. No significant vascular findings. Reproductive: The prostate is unremarkable. Other: Bilateral subcutaneous edema seen around the hips. Musculoskeletal: No fracture or dislocation. 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 There is a large heterogeneous hyperdense areas seen throughout the right gluteal musculature with expansion most notable within the gluteus medius muscle belly, likely intramuscular hematoma. There is also a small subcutaneous hyperdense collections seen overlying the left gluteal musculature, likely superficial hematomas. IMPRESSION: No acute osseous abnormality. Large intramuscular hematoma within the right gluteal musculature. Overlying subcutaneous edema and skin thickening. superficial subcutaneous small hematoma seen overlying the left gluteal musculature. Electronically Signed   By: 03/25/2020 M.D.   On: 01/24/2020 02:45   DG Hip Unilat W or Wo Pelvis 2-3 Views Right  Result Date: 01/23/2020 CLINICAL DATA:  Right hip pain post fall EXAM: DG HIP  (WITH OR WITHOUT PELVIS) 2-3V RIGHT COMPARISON:  None. FINDINGS: There is marked lateral soft tissue swelling of the right hip. Suspicious for overlying contusive change and/or hematoma. There is a questionable lucency along the posterolateral aspect of the right femoral head neck junction which could reflect a nondisplaced fracture or some projectional periarticular spurring. Additional suspicious lucency is seen along the junction of the left inferior pubic ramus and pubic body best seen on the lateral hip radiograph. Could correlate for point tenderness in this location. There is a background of diffuse degenerative change in the lower spine, hips and pelvis. Enthesopathic changes noted as well at the iliac crest, ischial tuberosities and lesser trochanters bilaterally. IMPRESSION: 1. Marked lateral soft tissue swelling of the right hip. Suspicious for overlying contusive change and/or hematoma. 2. Questionable lucency along the posterolateral aspect of the right femoral head neck junction which could reflect a nondisplaced fracture or some projectional periarticular spurring. 3. Additional suspicious lucency along the junction of the left inferior pubic ramus and pubic body  best seen on the lateral radiograph. 4. Given these latter findings, further evaluation with a bony pelvis CT could be obtained for more definitive assessment. Electronically Signed   By: Kreg Shropshire M.D.   On: 01/23/2020 21:55   US Abdomen Limited RUQ  Result Date: 01/23/2020 CLINICAL DATA:  Elevated liver enzymes EXAM: ULTRASOUND ABDOMEN LIMITED RIGHT UPPER QUADRANT COMPARISON:  None. FINDINGS: Gallbladder: No gallstones or wall thickening visualized. No sonographic Murphy sign noted by sonographer. Common bile duct: Diameter: 5.9 mm Liver: Increased echogenicity seen throughout the liver parenchyma. No focal hepatic lesion. No biliary ductal dilatation. The portal vein is patent, with reversal of flow however and recanalized umbilical  vein. Other: None. IMPRESSION: Hepatic steatosis Reversal of flow in the portal vein with findings suggestive of portal hypertension Electronically Signed   By: Jonna Clark M.D.   On: 01/23/2020 21:21      Impression / Plan:   Assessment: Active Problems:   Acute blood loss anemia   GIB (gastrointestinal bleeding)   Transaminitis   Hyponatremia   Hypokalemia   Falls   Hip pain   Kolbey Dontavion Noxon is a 49 y.o. y/o male with alcohol abuse and alcoholic hepatitis who was brought in with a significant drop in his hemoglobin.  This can be explained by his numerous large ecchymotic lesions on his lower extremities from his recent falls.  This was also seen on his CT scan.  Due to his alcohol abuse the possibility of a upper GI bleed and possible variceal bleed should also be considered.  The patient is now being treated with a PPI and octreotide.  Plan:  The patient was set up for a upper endoscopy for today but after discussing the case with anesthesia they would like the patient to have a higher hemoglobin and have requested the patient to get transfused before any sedation/anesthesia was given.  I have notified the hospitalist about the anesthesia request and they have agreed to transfuse the patient.  The patient will be put on a clear liquid diet today due to the fact that he will likely have his procedure done tomorrow instead of today.  Thank you for involving me in the care of this patient.      LOS: 1 day   Cody Minium, MD, [facility] 01/24/2020, 12:11 PM,  Pager [phone] 7am-5pm  Check AMION for 5pm -7am coverage and on weekends   Note: This dictation was prepared with Dragon dictation along with smaller phrase technology. Any transcriptional errors that result from this process are unintentional.

## 2020-01-24 NOTE — ED Notes (Signed)
Admitting MD Shahmehdi bedside at this time.

## 2020-01-24 NOTE — ED Notes (Signed)
Pt was given two cups of ice water.

## 2020-01-24 NOTE — ED Notes (Signed)
Provider gave okay for pt to drink water. Pt has water at bedside now

## 2020-01-25 ENCOUNTER — Telehealth: Payer: Self-pay | Admitting: Internal Medicine

## 2020-01-25 ENCOUNTER — Encounter
Admission: EM | Disposition: A | Discharge status: Home / Self Care | Payer: Self-pay | Source: Home / Self Care | Attending: Internal Medicine

## 2020-01-25 DIAGNOSIS — I959 Hypotension, unspecified: Secondary | ICD-10-CM | POA: Diagnosis present

## 2020-01-25 DIAGNOSIS — R7401 Elevation of levels of liver transaminase levels: Secondary | ICD-10-CM

## 2020-01-25 DIAGNOSIS — A419 Sepsis, unspecified organism: Secondary | ICD-10-CM | POA: Diagnosis present

## 2020-01-25 DIAGNOSIS — E861 Hypovolemia: Secondary | ICD-10-CM | POA: Diagnosis present

## 2020-01-25 DIAGNOSIS — M6282 Rhabdomyolysis: Secondary | ICD-10-CM | POA: Diagnosis present

## 2020-01-25 LAB — BLOOD GAS, VENOUS
Acid-Base Excess: 4.5 mmol/L — ABNORMAL HIGH (ref 0.0–2.0)
Bicarbonate: 28.4 mmol/L — ABNORMAL HIGH (ref 20.0–28.0)
O2 Saturation: 82.1 %
Patient temperature: 37
pCO2, Ven: 39 mmHg — ABNORMAL LOW (ref 44.0–60.0)
pH, Ven: 7.47 — ABNORMAL HIGH (ref 7.250–7.430)
pO2, Ven: 43 mmHg (ref 32.0–45.0)

## 2020-01-25 LAB — CBC
HCT: 16.6 % — ABNORMAL LOW (ref 39.0–52.0)
Hemoglobin: 5.6 g/dL — ABNORMAL LOW (ref 13.0–17.0)
MCH: 33.9 pg (ref 26.0–34.0)
MCHC: 33.7 g/dL (ref 30.0–36.0)
MCV: 100.6 fL — ABNORMAL HIGH (ref 80.0–100.0)
Platelets: 95 10*3/uL — ABNORMAL LOW (ref 150–400)
RBC: 1.65 MIL/uL — ABNORMAL LOW (ref 4.22–5.81)
RDW: 21.5 % — ABNORMAL HIGH (ref 11.5–15.5)
WBC: 9.7 10*3/uL (ref 4.0–10.5)
nRBC: 0.9 % — ABNORMAL HIGH (ref 0.0–0.2)

## 2020-01-25 LAB — HEMOGLOBIN AND HEMATOCRIT, BLOOD
HCT: 16.5 % — ABNORMAL LOW (ref 39.0–52.0)
HCT: 16.9 % — ABNORMAL LOW (ref 39.0–52.0)
HCT: 24 % — ABNORMAL LOW (ref 39.0–52.0)
HCT: 18 % — ABNORMAL LOW (ref 39.0–52.0)
Hemoglobin: 5.8 g/dL — ABNORMAL LOW (ref 13.0–17.0)
Hemoglobin: 5.9 g/dL — ABNORMAL LOW (ref 13.0–17.0)
Hemoglobin: 8.1 g/dL — ABNORMAL LOW (ref 13.0–17.0)
Hemoglobin: 6.1 g/dL — ABNORMAL LOW (ref 13.0–17.0)

## 2020-01-25 LAB — COMPREHENSIVE METABOLIC PANEL WITH GFR
ALT: 61 U/L — ABNORMAL HIGH (ref 0–44)
AST: 194 U/L — ABNORMAL HIGH (ref 15–41)
Albumin: 2.6 g/dL — ABNORMAL LOW (ref 3.5–5.0)
Alkaline Phosphatase: 39 U/L (ref 38–126)
Anion gap: 8 (ref 5–15)
BUN: 32 mg/dL — ABNORMAL HIGH (ref 6–20)
CO2: 24 mmol/L (ref 22–32)
Calcium: 6.8 mg/dL — ABNORMAL LOW (ref 8.9–10.3)
Chloride: 102 mmol/L (ref 98–111)
Creatinine, Ser: 1.16 mg/dL (ref 0.61–1.24)
GFR calc Af Amer: >60 mL/min
GFR calc non Af Amer: >60 mL/min
Glucose, Bld: 106 mg/dL — ABNORMAL HIGH (ref 70–99)
Potassium: 4.2 mmol/L (ref 3.5–5.1)
Sodium: 134 mmol/L — ABNORMAL LOW (ref 135–145)
Total Bilirubin: 16.5 mg/dL — ABNORMAL HIGH (ref 0.3–1.2)
Total Protein: 5.6 g/dL — ABNORMAL LOW (ref 6.5–8.1)

## 2020-01-25 LAB — HEPATIC FUNCTION PANEL
ALT: 67 U/L — ABNORMAL HIGH (ref 0–44)
AST: 207 U/L — ABNORMAL HIGH (ref 15–41)
Albumin: 2.5 g/dL — ABNORMAL LOW (ref 3.5–5.0)
Alkaline Phosphatase: 36 U/L — ABNORMAL LOW (ref 38–126)
Bilirubin, Direct: 5.5 mg/dL — ABNORMAL HIGH (ref 0.0–0.2)
Indirect Bilirubin: 10.3 mg/dL — ABNORMAL HIGH (ref 0.3–0.9)
Total Bilirubin: 15.8 mg/dL — ABNORMAL HIGH (ref 0.3–1.2)
Total Protein: 5.3 g/dL — ABNORMAL LOW (ref 6.5–8.1)

## 2020-01-25 LAB — COMPREHENSIVE METABOLIC PANEL
ALT: 65 U/L — ABNORMAL HIGH (ref 0–44)
AST: 208 U/L — ABNORMAL HIGH (ref 15–41)
Albumin: 2.6 g/dL — ABNORMAL LOW (ref 3.5–5.0)
Alkaline Phosphatase: 37 U/L — ABNORMAL LOW (ref 38–126)
Anion gap: 7 (ref 5–15)
BUN: 26 mg/dL — ABNORMAL HIGH (ref 6–20)
CO2: 26 mmol/L (ref 22–32)
Calcium: 6.4 mg/dL — CL (ref 8.9–10.3)
Chloride: 98 mmol/L (ref 98–111)
Creatinine, Ser: 1.08 mg/dL (ref 0.61–1.24)
GFR calc Af Amer: >60 mL/min (ref >60)
GFR calc non Af Amer: >60 mL/min (ref >60)
Glucose, Bld: 112 mg/dL — ABNORMAL HIGH (ref 70–99)
Potassium: 4 mmol/L (ref 3.5–5.1)
Sodium: 131 mmol/L — ABNORMAL LOW (ref 135–145)
Total Bilirubin: 16.2 mg/dL — ABNORMAL HIGH (ref 0.3–1.2)
Total Protein: 5.4 g/dL — ABNORMAL LOW (ref 6.5–8.1)

## 2020-01-25 LAB — URINALYSIS, ROUTINE W REFLEX MICROSCOPIC
Bacteria, UA: NONE SEEN
Glucose, UA: NEGATIVE mg/dL
Hgb urine dipstick: NEGATIVE
Ketones, ur: NEGATIVE mg/dL
Leukocytes,Ua: NEGATIVE
Nitrite: NEGATIVE
Protein, ur: NEGATIVE mg/dL
Specific Gravity, Urine: 1.02 (ref 1.005–1.030)
Squamous Epithelial / HPF: NONE SEEN (ref 0–5)
pH: 5 (ref 5.0–8.0)

## 2020-01-25 LAB — LACTIC ACID, PLASMA
Lactic Acid, Venous: 1.3 mmol/L (ref 0.5–1.9)
Lactic Acid, Venous: 1.5 mmol/L (ref 0.5–1.9)

## 2020-01-25 LAB — AMMONIA: Ammonia: 53 umol/L — ABNORMAL HIGH (ref 9–35)

## 2020-01-25 LAB — PROTIME-INR
INR: 1.7 — ABNORMAL HIGH (ref 0.8–1.2)
Prothrombin Time: 19.1 seconds — ABNORMAL HIGH (ref 11.4–15.2)

## 2020-01-25 LAB — CK: Total CK: 2227 U/L — ABNORMAL HIGH (ref 49–397)

## 2020-01-25 LAB — PROCALCITONIN: Procalcitonin: 0.13 ng/mL

## 2020-01-25 SURGERY — ESOPHAGOGASTRODUODENOSCOPY (EGD) WITH PROPOFOL
Anesthesia: General

## 2020-01-25 MED ORDER — ACETAMINOPHEN 10 MG/ML IV SOLN
1000.0000 mg | Freq: Four times a day (QID) | INTRAVENOUS | Status: AC
  Administered 2020-01-25 – 2020-01-26 (×3): 1000 mg via INTRAVENOUS
  Filled 2020-01-25 (×5): qty 100

## 2020-01-25 MED ORDER — ACETAMINOPHEN 500 MG PO TABS
1000.0000 mg | ORAL_TABLET | Freq: Once | ORAL | Status: AC
  Administered 2020-01-25: 1000 mg via ORAL

## 2020-01-25 MED ORDER — CALCIUM GLUCONATE 10 % IV SOLN
2.0000 g | Freq: Once | INTRAVENOUS | Status: AC
  Administered 2020-01-25: 2 g via INTRAVENOUS
  Filled 2020-01-25: qty 20

## 2020-01-25 MED ORDER — ACETAMINOPHEN 500 MG PO TABS
ORAL_TABLET | ORAL | Status: AC
  Filled 2020-01-25: qty 2

## 2020-01-25 NOTE — ED Notes (Signed)
Pt sleeping at this time, pt not given ativan

## 2020-01-25 NOTE — ED Notes (Signed)
Date and time results received: 01/25/20 9:01 AM (use smartphrase ".now" to insert current time)  Test: calcium Critical Value: 6.4  Name of Provider Notified: [email or web address]

## 2020-01-25 NOTE — ED Notes (Signed)
[name] in to eval

## 2020-01-25 NOTE — ED Notes (Signed)
Ice packs to groin and axilla per Anna Genre NP.

## 2020-01-25 NOTE — Progress Notes (Addendum)
PROGRESS NOTE    Patient: [name]                            PCP: Remi Haggard, FNP                    DOB: Apr 09, 1971            DOA: 01/23/2020 [id number]             DOS: 01/25/2020, 11:20 AM   LOS: 2 days   Date of Service: The patient was seen and examined on 01/25/2020  Subjective:   The patient was seen and examined this morning, hypotensive, febrile overnight, T-max this morning 102.1 Patient had a early sign of DTs, Ativan has been given, patient remains sleepy, somnolent  Blood transfusions or weight held due to fever. No signs of hematemesis or rectal bleed.  T-max this morning 102.9, blood pressure 90/57, hemoglobin 5.9  Note: Explained to the patient, and nursing staff that the risks outweighed the benefit therefore will resume 2 units of PRBCs blood transfusion--- he most likely needs 3 unit    Brief Narrative:   Orion Mole  is a 49 y.o. Caucasian male with a known history of polysubstance abuse including alcohol abuse who presented to the emergency room with acute onset of increased generalized weakness and a mechanical fall.  He believes that he lost balance and fell with subsequent right hip pain he usually drinks 5-6 beer at night.    ED: Heart rate was 106 and blood pressure 109/43 with pulse oximetry of 92 and later 96% on room air and otherwise normal vital signs.   Labs revealed hypokalemia of 3.3, anion gap of 15 albumin of 3 total 36.4, AST of 293 and ALT 83 with alk phos of 53 and total bili of 14.2. Ammonia level was 42.   CBC showed no leukocytosis of 11.6 and significant anemia with hemoglobin 6.7 hematocrit 19.1 compared to 10.4 and 29.5 on 01/20/2020 with thrombocytopenia of 123.  INR is 1.5 and PT 17.3.   COVID-19 PCR came back negative.  Alcohol level was 296.Marland Kitchen   Stool Hemoccult was weakly positive.   Right upper quadrant ultrasound revealed hepatic steatosis with findings suggestive of portal hypertension. Right hip CT showed  marked left lower soft tissue swelling suspicious for overlying contusive change and/or hematoma and questionable lucency along the posterolateral aspect of the right femoral head neck junction that could reflect a nondisplaced fracture or some projectional periarticular spurring. There is a suspicious lucency along the junction of the left inferior pubic ramus and pubic body best seen on the lateral radiograph.  The patient was given IV Protonix bolus and infusion, IV octreotide bolus and infusion, 1 g of IV Rocephin and IV Mucomyst   Assessment & Plan:   Active Problems:   Acute blood loss anemia   GIB (gastrointestinal bleeding)   Transaminitis   Hyponatremia   Hypokalemia   Falls   Hip pain   Acute symptomatic blood loss anemia secondary to GI bleeding. -On admission received 2 units of PRBC, hemoglobin remained low 6.4, 5.9 today -Additional 2 units of PRBC transfusion were withheld due to fever  -Discussed with GI and hematology -the benefit outweighs the risks with blood transfusion Therefore more pursue with blood transfusion this morning  Status post transfusion with 2 units of PRBC 01/24/2020, hemoglobin this morning still at 6.4, no signs of bleeding Another 2  units of PRBC transfusion will be resumed -Hemoccult positive, no active signs of bleeding Hemoglobin on admission 6.7, 6.6,>>> 2 U PRBC >> 6.4 >>> 5.9  today -We will follow posttransfusion H&H. -We will continue him on IV Protonix drip and octreotide drip. -GI consulted -withholding EGD due to hypotension, severe anemia, hypocalcemia, fever -GI following --- appreciate and close follow-up and recommendations  SIRS/sepsis fever of unknown origin -meets the criteria for T-max 102.9, tachypneic 27 hypotensive 90/57 (But this is complicated by severe anemia, hypovolemia, rhabdomyolysis -due to fall, alcohol intoxication) -Fever T-max of 102.9, WBC of 9.9, lactic acid 1.3, 1.5, procalcitonin 0.13, -Blood cultures  have been obtained -Patient has been started on Rocephin, will broaden antibiotic to cefepime   Rhabdomyelitis -Monitoring CK level, creatinine level -Continue IV fluids   Alcoholic hepatitis with early hepatic cell failure and current alcohol intoxication. -Initiating alcohol detox protocol CIWA -Monitoring LFTs -Monitoring for severe withdrawal, if needed will utilize Librium versus Precedex -Patient discriminant liver function is> 32, 39 concerning steroid initiation due to alcohol hepatitis but due to fever without source of infection at this time will withhold steroids as this risks outweighs the benefit.  Hypokalemia/hypocalcemia hypomagnesemia -Monitoring and repleting accordingly Repleting with calcium gluconate, check an ionized calcium.   Hyponatremia. -Likely due to beer azotemia, monitoring closely -  Fall with subsequent multiple bruises / contusions /hematoma/ruled out the possibility of  Rt femoral head neck fracture, inferior pubic ramus fracture  -Orthopedic consultation will be obtained.... Appreciate input -hip CT scan >> revealing extensive hematoma negative for any acute fractures -N otified Dr. Roland Rack about the patient..  Dyslipidemia. -Statin therapy will be held off given acute hepatitis.    Essential hypertension. -We will continue Toprol-XL.   DVT prophylaxis.-SCDs. -Medical prophylaxis currently contraindicated due to his associated alcohol-related chronic liver disease and subsequent coagulopathy... Also extensive hematoma CODE STATUS: Full code Family communication -no family member present at bedside-discussed with the patient  Status is: Inpatient  Remains inpatient appropriate because:Hemodynamically unstable, Persistent severe electrolyte disturbances, Ongoing diagnostic testing needed not appropriate for outpatient work up, Unsafe d/c plan, IV treatments appropriate due to intensity of illness or inability to take PO and Inpatient level  of care appropriate due to severity of illness   Dispo: The patient is from: Home  Anticipated d/c is to: Home  Anticipated d/c date is: 2 days  Patient currently is not medically stable to d/c.      Consultants:GI/ Ortho      Procedures:   No admission procedures for hospital encounter.    Antimicrobials:  Anti-infectives (From admission, onward)   Start     Dose/Rate Route Frequency Ordered Stop   01/24/20 1600  cefTRIAXone (ROCEPHIN) 2 g in sodium chloride 0.9 % 100 mL IVPB        2 g 200 mL/hr over 30 Minutes Intravenous Every 24 hours 01/24/20 1520     01/23/20 2045  cefTRIAXone (ROCEPHIN) 1 g in sodium chloride 0.9 % 100 mL IVPB        1 g 200 mL/hr over 30 Minutes Intravenous  Once 01/23/20 2042 01/23/20 2216       Medication:  . sodium chloride   Intravenous Once  . acetaminophen      . calcium gluconate  2 g Intravenous Once  . folic acid  1 mg Oral Daily  . furosemide  20 mg Intravenous Once  . metoprolol succinate  25 mg Oral Daily  . multivitamin with minerals  1 tablet Oral  Daily  . [START ON 01/27/2020] pantoprazole  40 mg Intravenous Q12H  . thiamine  100 mg Oral Daily   Or  . thiamine  100 mg Intravenous Daily    acetaminophen **OR** acetaminophen, LORazepam **OR** LORazepam, morphine injection, ondansetron **OR** ondansetron (ZOFRAN) IV, traZODone   Objective:   Vitals:   01/25/20 0930 01/25/20 0945 01/25/20 1030 01/25/20 1100  BP: (!) 100/53  (!) 100/59 (!) 98/44  Pulse: 83 82 79 79  Resp: (!) 27 (!) 28 (!) 29 (!) 27  Temp:      TempSrc:      SpO2: 100% 100% 100% 99%  Weight:      Height:        Intake/Output Summary (Last 24 hours) at 01/25/2020 1120 Last data filed at 01/25/2020 0854 Gross per 24 hour  Intake 1425 ml  Output 350 ml  Net 1075 ml   Filed Weights   01/24/20 1058  Weight: 108.9 kg     Examination:      Physical Exam:   General:  Alert, oriented, cooperative, no  distress;   HEENT:  Normocephalic, PERRL, otherwise with in Normal limits   Neuro:  CNII-XII intact. , normal motor and sensation, reflexes intact   Lungs:   Clear to auscultation BL, Respirations unlabored, no wheezes / crackles  Cardio:    S1/S2, RRR, No murmure, No Rubs or Gallops   Abdomen:   Soft, non-tender, bowel sounds active all four quadrants,  no guarding or peritoneal signs.  Muscular skeletal:  Limited exam - in bed, able to move all 4 extremities, Normal strength,  2+ pulses,  symmetric, No pitting edema  Skin:   Jaundice ---negative for any Rashes,    Extensive bruises, hematoma on lower extremities, facial, large neck hematoma ... Otherwise dry, warm to touch, negative for any Rashes,  Wounds: None visible-per nursing documentation    ------------------------------------------------------------------------------------------------------------------------------------------    LABs:  CBC Latest Ref Rng & Units 01/25/2020 01/25/2020 01/25/2020  WBC 4.0 - 10.5 K/uL - 9.7 -  Hemoglobin 13.0 - 17.0 g/dL 5.9(L) 5.6(L) 5.8(L)  Hematocrit 39 - 52 % 16.9(L) 16.6(L) 16.5(L)  Platelets 150 - 400 K/uL - 95(L) -   CMP Latest Ref Rng & Units 01/25/2020 01/24/2020 01/24/2020  Glucose 70 - 99 mg/dL 112(H) 126(H) 129(H)  BUN 6 - 20 mg/dL 26(H) 22(H) 23(H)  Creatinine 0.61 - 1.24 mg/dL 1.08 0.91 1.00  Sodium 135 - 145 mmol/L 131(L) 128(L) 130(L)  Potassium 3.5 - 5.1 mmol/L 4.0 3.7 3.7  Chloride 98 - 111 mmol/L 98 92(L) 92(L)  CO2 22 - 32 mmol/L $RemoveB'26 26 30  'FqctifeI$ Calcium 8.9 - 10.3 mg/dL 6.4(LL) 6.5(L) 6.5(L)  Total Protein 6.5 - 8.1 g/dL 5.4(L) 5.8(L) 5.7(L)  Total Bilirubin 0.3 - 1.2 mg/dL 16.2(H) 14.0(H) 13.6(H)  Alkaline Phos 38 - 126 U/L 37(L) 46 46  AST 15 - 41 U/L 208(H) 263(H) 259(H)  ALT 0 - 44 U/L 65(H) 77(H) 80(H)       Micro Results Recent Results (from the past 240 hour(s))  SARS Coronavirus 2 by RT PCR (hospital order, performed in [facility] hospital lab) Nasopharyngeal  Nasopharyngeal Swab     Status: None   Collection Time: 01/23/20  7:35 PM   Specimen: Nasopharyngeal Swab  Result Value Ref Range Status   SARS Coronavirus 2 NEGATIVE NEGATIVE Final    Comment: (NOTE) SARS-CoV-2 target nucleic acids are NOT DETECTED.  The SARS-CoV-2 RNA is generally detectable in upper and lower respiratory specimens during the acute  phase of infection. The lowest concentration of SARS-CoV-2 viral copies this assay can detect is 250 copies / mL. A negative result does not preclude SARS-CoV-2 infection and should not be used as the sole basis for treatment or other patient management decisions.  A negative result may occur with improper specimen collection / handling, submission of specimen other than nasopharyngeal swab, presence of viral mutation(s) within the areas targeted by this assay, and inadequate number of viral copies (<250 copies / mL). A negative result must be combined with clinical observations, patient history, and epidemiological information.  Fact Sheet for Patients:   StrictlyIdeas.no  Fact Sheet for Healthcare Providers: BankingDealers.co.za  This test is not yet approved or  cleared by the [place] FDA and has been authorized for detection and/or diagnosis of SARS-CoV-2 by FDA under an Emergency Use Authorization (EUA).  This EUA will remain in effect (meaning this test can be used) for the duration of the COVID-19 declaration under Section 564(b)(1) of the Act, 21 U.S.C. section 360bbb-3(b)(1), unless the authorization is terminated or revoked sooner.  Performed at [facility], [address] [place], [place] [address]   CULTURE, BLOOD (ROUTINE X 2) w Reflex to ID Panel     Status: None (Preliminary result)   Collection Time: 01/24/20  4:15 PM   Specimen: BLOOD  Result Value Ref Range Status   Specimen Description BLOOD RIGHT ANTECUBITAL  Final   Special Requests   Final     BOTTLES DRAWN AEROBIC AND ANAEROBIC Blood Culture adequate volume   Culture   Final    NO GROWTH < 12 HOURS Performed at [facility], [address], [place], [place] [address]    Report Status PENDING  Incomplete  CULTURE, BLOOD (ROUTINE X 2) w Reflex to ID Panel     Status: None (Preliminary result)   Collection Time: 01/24/20  4:15 PM   Specimen: BLOOD  Result Value Ref Range Status   Specimen Description BLOOD LEFT ANTECUBITAL  Final   Special Requests   Final    BOTTLES DRAWN AEROBIC AND ANAEROBIC Blood Culture adequate volume   Culture   Final    NO GROWTH < 12 HOURS Performed at [facility], [address] [place], [place] [address]    Report Status PENDING  Incomplete    Radiology Reports CT PELVIS WO CONTRAST  Result Date: 01/24/2020 CLINICAL DATA:  Pain, question fracture EXAM: CT PELVIS WITHOUT CONTRAST TECHNIQUE: Multidetector CT imaging of the pelvis was performed following the standard protocol without intravenous contrast. COMPARISON:  None. FINDINGS: Urinary Tract: The visualized distal ureters and bladder appear unremarkable. Bowel: No bowel wall thickening, distention or surrounding inflammation identified within the pelvis. Vascular/Lymphatic: No enlarged pelvic lymph nodes identified. No significant vascular findings. Reproductive: The prostate is unremarkable. Other: Bilateral subcutaneous edema seen around the hips. Musculoskeletal: No fracture or dislocation. 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 There is a large heterogeneous hyperdense areas seen throughout the right gluteal musculature with expansion most notable within the gluteus medius muscle belly, likely intramuscular hematoma. There is also a small subcutaneous hyperdense collections seen overlying the left gluteal musculature, likely superficial hematomas. IMPRESSION: No acute osseous abnormality. Large  intramuscular hematoma within the right gluteal musculature. Overlying subcutaneous edema and skin thickening. superficial subcutaneous small hematoma seen overlying the left gluteal musculature. Electronically Signed   By: Prudencio Pair M.D.   On: 01/24/2020 02:45   DG Hip Unilat W  or Wo Pelvis 2-3 Views Right  Result Date: 01/23/2020 CLINICAL DATA:  Right hip pain post fall EXAM: DG HIP (WITH OR WITHOUT PELVIS) 2-3V RIGHT COMPARISON:  None. FINDINGS: There is marked lateral soft tissue swelling of the right hip. Suspicious for overlying contusive change and/or hematoma. There is a questionable lucency along the posterolateral aspect of the right femoral head neck junction which could reflect a nondisplaced fracture or some projectional periarticular spurring. Additional suspicious lucency is seen along the junction of the left inferior pubic ramus and pubic body best seen on the lateral hip radiograph. Could correlate for point tenderness in this location. There is a background of diffuse degenerative change in the lower spine, hips and pelvis. Enthesopathic changes noted as well at the iliac crest, ischial tuberosities and lesser trochanters bilaterally. IMPRESSION: 1. Marked lateral soft tissue swelling of the right hip. Suspicious for overlying contusive change and/or hematoma. 2. Questionable lucency along the posterolateral aspect of the right femoral head neck junction which could reflect a nondisplaced fracture or some projectional periarticular spurring. 3. Additional suspicious lucency along the junction of the left inferior pubic ramus and pubic body best seen on the lateral radiograph. 4. Given these latter findings, further evaluation with a bony pelvis CT could be obtained for more definitive assessment. Electronically Signed   By: Lovena Le M.D.   On: 01/23/2020 21:55   US Abdomen Limited RUQ  Result Date: 01/23/2020 CLINICAL DATA:  Elevated liver enzymes EXAM: ULTRASOUND ABDOMEN LIMITED  RIGHT UPPER QUADRANT COMPARISON:  None. FINDINGS: Gallbladder: No gallstones or wall thickening visualized. No sonographic Murphy sign noted by sonographer. Common bile duct: Diameter: 5.9 mm Liver: Increased echogenicity seen throughout the liver parenchyma. No focal hepatic lesion. No biliary ductal dilatation. The portal vein is patent, with reversal of flow however and recanalized umbilical vein. Other: None. IMPRESSION: Hepatic steatosis Reversal of flow in the portal vein with findings suggestive of portal hypertension Electronically Signed   By: Prudencio Pair M.D.   On: 01/23/2020 21:21    Total of 79 minutes of critical time was spent in evaluating reviewing labs discussing plan of care with nursing staff, consultants--- drawing including treatment for today. Stabilizing the patient with blood transfusion IV fluids.Marland Kitchen   SIGNED: Deatra James, MD, FACP, FHM. [facility],  Pager (please use [email or web address] to page/text)  If 7PM-7AM, please contact night-coverage [email or web address], Password [facility] 01/25/2020, 11:20 AM

## 2020-01-25 NOTE — ED Notes (Signed)
Pt sleeping at this time.

## 2020-01-25 NOTE — ED Notes (Signed)
RN went in to check on patient due to IV pump beeping, Pt awake in bed, pt able to carry on conversation with this RN. Pt is oriented x 4. Pt temp rechecked orally. Pt afebrile at this time. Pt c/o not being able to get warm. Pt was given warm blanket.

## 2020-01-25 NOTE — Progress Notes (Signed)
The patient's hemoglobin is down today despite getting transfusions.  The patient's discriminant function is over 32 at 39.  The patient was considered for steroid initiation due to his alcoholic hepatitis but since he has been having a fever without a source I do not think steroids are a good idea at this time. His calcium is also low.  Steroids for alcoholic hepatitis can decrease in hospital mortality but does not prolong long-term survival.  Midge Minium, MD

## 2020-01-25 NOTE — ED Notes (Signed)
Cries for help answered, pt given urinal, and emptied, pt oriented to call bell light

## 2020-01-25 NOTE — Telephone Encounter (Signed)
Contacted by Dr. Flossie Dibble regarding need for transfusion in the context of his ongoing fevers.  Review of chart is done.  I think benefits of transfusion outweigh the risk. Discussed with Dr Flossie Dibble.  GB

## 2020-01-25 NOTE — ED Notes (Signed)
Pt still confused and restless at this time, will continue to monitor.

## 2020-01-25 NOTE — ED Notes (Signed)
Admitted MD at bedside. 

## 2020-01-26 DIAGNOSIS — T796XXD Traumatic ischemia of muscle, subsequent encounter: Secondary | ICD-10-CM

## 2020-01-26 DIAGNOSIS — R6520 Severe sepsis without septic shock: Secondary | ICD-10-CM

## 2020-01-26 DIAGNOSIS — R7989 Other specified abnormal findings of blood chemistry: Secondary | ICD-10-CM

## 2020-01-26 DIAGNOSIS — A419 Sepsis, unspecified organism: Principal | ICD-10-CM

## 2020-01-26 DIAGNOSIS — J9600 Acute respiratory failure, unspecified whether with hypoxia or hypercapnia: Secondary | ICD-10-CM

## 2020-01-26 LAB — HEMOGLOBIN AND HEMATOCRIT, BLOOD
HCT: 22.4 % — ABNORMAL LOW (ref 39.0–52.0)
HCT: 20.5 % — ABNORMAL LOW (ref 39.0–52.0)
HCT: 21.6 % — ABNORMAL LOW (ref 39.0–52.0)
Hemoglobin: 7.8 g/dL — ABNORMAL LOW (ref 13.0–17.0)
Hemoglobin: 7.2 g/dL — ABNORMAL LOW (ref 13.0–17.0)
Hemoglobin: 7.4 g/dL — ABNORMAL LOW (ref 13.0–17.0)

## 2020-01-26 LAB — CBC
HCT: 22.2 % — ABNORMAL LOW (ref 39.0–52.0)
Hemoglobin: 7.4 g/dL — ABNORMAL LOW (ref 13.0–17.0)
MCH: 32.7 pg (ref 26.0–34.0)
MCHC: 33.3 g/dL (ref 30.0–36.0)
MCV: 98.2 fL (ref 80.0–100.0)
Platelets: 88 10*3/uL — ABNORMAL LOW (ref 150–400)
RBC: 2.26 MIL/uL — ABNORMAL LOW (ref 4.22–5.81)
RDW: 22.8 % — ABNORMAL HIGH (ref 11.5–15.5)
WBC: 10.8 10*3/uL — ABNORMAL HIGH (ref 4.0–10.5)
nRBC: 0.4 % — ABNORMAL HIGH (ref 0.0–0.2)

## 2020-01-26 LAB — COMPREHENSIVE METABOLIC PANEL
ALT: 58 U/L — ABNORMAL HIGH (ref 0–44)
AST: 190 U/L — ABNORMAL HIGH (ref 15–41)
Albumin: 2.6 g/dL — ABNORMAL LOW (ref 3.5–5.0)
Alkaline Phosphatase: 37 U/L — ABNORMAL LOW (ref 38–126)
Anion gap: 6 (ref 5–15)
BUN: 31 mg/dL — ABNORMAL HIGH (ref 6–20)
CO2: 25 mmol/L (ref 22–32)
Calcium: 6.7 mg/dL — ABNORMAL LOW (ref 8.9–10.3)
Chloride: 103 mmol/L (ref 98–111)
Creatinine, Ser: 1.08 mg/dL (ref 0.61–1.24)
GFR calc Af Amer: >60 mL/min (ref >60)
GFR calc non Af Amer: >60 mL/min (ref >60)
Glucose, Bld: 120 mg/dL — ABNORMAL HIGH (ref 70–99)
Potassium: 4.1 mmol/L (ref 3.5–5.1)
Sodium: 134 mmol/L — ABNORMAL LOW (ref 135–145)
Total Bilirubin: 16.1 mg/dL — ABNORMAL HIGH (ref 0.3–1.2)
Total Protein: 5.6 g/dL — ABNORMAL LOW (ref 6.5–8.1)

## 2020-01-26 LAB — URINE DRUG SCREEN, QUALITATIVE (ARMC ONLY)
Amphetamines, Ur Screen: NOT DETECTED
Barbiturates, Ur Screen: NOT DETECTED
Benzodiazepine, Ur Scrn: POSITIVE — AB
Cannabinoid 50 Ng, Ur ~~LOC~~: NOT DETECTED
Cocaine Metabolite,Ur ~~LOC~~: NOT DETECTED
MDMA (Ecstasy)Ur Screen: NOT DETECTED
Methadone Scn, Ur: NOT DETECTED
Opiate, Ur Screen: POSITIVE — AB
Phencyclidine (PCP) Ur S: NOT DETECTED
Tricyclic, Ur Screen: NOT DETECTED

## 2020-01-26 LAB — CK: Total CK: 2165 U/L — ABNORMAL HIGH (ref 49–397)

## 2020-01-26 LAB — AMMONIA: Ammonia: 44 umol/L — ABNORMAL HIGH (ref 9–35)

## 2020-01-26 LAB — PROCALCITONIN: Procalcitonin: 0.77 ng/mL

## 2020-01-26 MED ORDER — LACTATED RINGERS IV SOLN: INTRAVENOUS | Status: DC

## 2020-01-26 NOTE — ED Notes (Signed)
Pt removed non-rebreather stating that it was making him claustrophobic. Placed back on nasal cannula at this time. Pt yelled about wanting the lights turned down. This RN turned lights down and pt verbalized comfort.

## 2020-01-26 NOTE — ED Notes (Signed)
More water and apple juice provided

## 2020-01-26 NOTE — Progress Notes (Signed)
GI Inpatient Follow-up Note  Subjective:  Patient seen in follow-up for Dr. Servando Snare over the weekend for alcoholic hepatitis and anemia 2/2 lower extremity hematomas. EGD with withheld yesterday due to hypotension, severe anemia, hypocalcemia, and fever. His Maddrey DF was >32 at 6, but steroids were not given due to fever without source of infection per Dr. Servando Snare. He did receive 2 units pRBCs yesterday. Hemoglobin improved to 7.8 today from 5.9 yesterday. This morning he was tachypneic, tachycardic, and hypertensive No overt gastrointestinal blood loss. Patient denies any new complaints. He reports severe pain in right hip and right lower extremity.    Scheduled Inpatient Medications:  . sodium chloride   Intravenous Once  . folic acid  1 mg Oral Daily  . furosemide  20 mg Intravenous Once  . metoprolol succinate  25 mg Oral Daily  . multivitamin with minerals  1 tablet Oral Daily  . thiamine  100 mg Oral Daily   Or  . thiamine  100 mg Intravenous Daily    Continuous Inpatient Infusions:   . cefTRIAXone (ROCEPHIN)  IV Stopped (01/25/20 1556)  . dexmedetomidine (PRECEDEX) IV infusion 0.5 mcg/kg/hr (01/26/20 0807)  . lactated ringers 150 mL/hr at 01/26/20 0819  . octreotide  (SANDOSTATIN)    IV infusion Stopped (01/26/20 0541)  . pantoprozole (PROTONIX) infusion 8 mg/hr (01/26/20 0543)    PRN Inpatient Medications:  acetaminophen **OR** acetaminophen, LORazepam **OR** LORazepam, morphine injection, ondansetron **OR** ondansetron (ZOFRAN) IV, traZODone  Review of Systems: Constitutional: Weight is stable.  Eyes: No changes in vision. ENT: No oral lesions, sore throat.  GI: see HPI.  Heme/Lymph: No easy bruising.  CV: No chest pain.  GU: No hematuria.  Integumentary: No rashes.  Neuro: No headaches.  Psych: No depression/anxiety.  Endocrine: No heat/cold intolerance.  Allergic/Immunologic: No urticaria.  Resp: No cough, SOB.  Musculoskeletal: No joint swelling.    Physical  Examination: BP (!) 183/83   Pulse (!) 116   Temp 99.8 F (37.7 C) (Oral)   Resp (!) 26   Ht 5\' 7"  (1.702 m)   Wt 108.9 kg   SpO2 (!) 87%   BMI 37.59 kg/m  Gen: NAD, alert and oriented x 4 HEENT: PEERLA, EOMI, +sclera icteric Neck: supple, no JVD or thyromegaly Chest: CTA bilaterally, no wheezes, crackles, or other adventitious sounds CV: RRR, no m/g/c/r Abd: soft, NT, ND, +BS in all four quadrants; no HSM, guarding, ridigity, or rebound tenderness Ext: no edema, well perfused with 2+ pulses, Skin: Multiple areas of large bruising on his lower extremities. Lymph: no LAD  Data: Lab Results  Component Value Date   WBC 10.8 (H) 01/26/2020   HGB 7.8 (L) 01/26/2020   HCT 22.4 (L) 01/26/2020   MCV 98.2 01/26/2020   PLT 88 (L) 01/26/2020   Recent Labs  Lab 01/25/20 2205 01/26/20 0448 01/26/20 0806  HGB 6.1* 7.4* 7.8*   Lab Results  Component Value Date   NA 134 (L) 01/26/2020   K 4.1 01/26/2020   CL 103 01/26/2020   CO2 25 01/26/2020   BUN 31 (H) 01/26/2020   CREATININE 1.08 01/26/2020   Lab Results  Component Value Date   ALT 58 (H) 01/26/2020   AST 190 (H) 01/26/2020   ALKPHOS 37 (L) 01/26/2020   BILITOT 16.1 (H) 01/26/2020   Recent Labs  Lab 01/24/20 0403 01/24/20 0403 01/25/20 0759  APTT 36  --   --   INR 1.6*   < > 1.7*   < > = values  in this interval not displayed.   Assessment/Plan:  49 y/o Caucasian male with a PMH of polysubstance abuse including alcohol abuse who was admitted 09/01 for generalized weakness and a mechanical fall found to have severe anemia and alcoholic hepatitis   1. Anemia - likely 2/2 hematomas. NO evidence of overt gastrointestinal bleeding. He has responded appropriately to transfusions. Hgb 7.8 this AM.   2. Alcoholic hepatitis - Maddrey DF 39. Steroids not given due to fever with no source of infection. Steroids do not prolong long-term survival.  3. SIRS/Sepsis with FUO - fever 101.4 last night  4. Rhabdomyelitis - CK  2165. On IVF hydration LR 150 cc/hour.  5. Metabolic derangement - hyponatremia, hypokalemia, hypocalcemia, hypomagnesemia. Monitored and repleted by hospital team.    Recommendations:  -Continue to monitor H&H closely and transfuse as needed to ensure Hgb >7 -Continue Protonix and octreotide. Can d/c Octreotide after 72 hours.  -No overt signs of GI bleeding -No plans for endoscopy at present given severe systemic illness and no signs of active hemorrhage -Continue to monitor LFTs daily -CIWA protocol -Prognosis is guarded. Importance of complete alcohol cessation was discussed with patient. -Following along with you  Please call with questions or concerns.    Jacob Moores, PA-C [facility] [phone] (Cell)

## 2020-01-26 NOTE — ED Notes (Signed)
Pt called out asking for an inhaler for his asthma. Assess pt breathing, breathing remains same as earlier, tachypnic and shallow. Pt remains on nasal cannula at 92%. Placed non-rebreather on pt for comfort and informed not to take off. Will continue to monitor. Admitting MD aware of pt breathing.

## 2020-01-26 NOTE — ED Notes (Signed)
Pt changed, washed, bedding changed. Pt rolling and restless, inconsistently comprehensible

## 2020-01-26 NOTE — ED Notes (Signed)
Messaged admitting MD regarding missing pt admit order. Order status acknowledged, awaiting additional order

## 2020-01-26 NOTE — Progress Notes (Signed)
PROGRESS NOTE    Patient: [name]                            PCP: Remi Haggard, FNP                    DOB: [birth year]            DOA: 01/23/2020 DXI:[id number]             DOS: 01/26/2020, 10:55 AM   LOS: 3 days   Date of Service: The patient was seen and examined on 01/26/2020  Subjective:   The patient was seen and examined this morning, awake, complaining of generalized aches and pain. This morning patient was found tachypneic, tachycardic, hypertensive. Satting 97% on supplemental oxygen, was on nonrebreather with overnight.  Tolerated the blood transfusion yesterday. Received 2 units PRBC Hemoglobin 7.8 this AM     Brief Narrative:   [name]  is a 49 y.o. Caucasian male with a known history of polysubstance abuse including alcohol abuse who presented to the emergency room with acute onset of increased generalized weakness and a mechanical fall.  He believes that he lost balance and fell with subsequent right hip pain he usually drinks 5-6 beer at night.    ED: Heart rate was 106 and blood pressure 109/43 with pulse oximetry of 92 and later 96% on room air and otherwise normal vital signs.   Labs revealed hypokalemia of 3.3, anion gap of 15 albumin of 3 total 36.4, AST of 293 and ALT 83 with alk phos of 53 and total bili of 14.2. Ammonia level was 42.   CBC showed no leukocytosis of 11.6 and significant anemia with hemoglobin 6.7 hematocrit 19.1 compared to 10.4 and 29.5 on 01/20/2020 with thrombocytopenia of 123.  INR is 1.5 and PT 17.3.   COVID-19 PCR came back negative.  Alcohol level was 296.Marland Kitchen   Stool Hemoccult was weakly positive.   Right upper quadrant ultrasound revealed hepatic steatosis with findings suggestive of portal hypertension. Right hip CT showed marked left lower soft tissue swelling suspicious for overlying contusive change and/or hematoma and questionable lucency along the posterolateral aspect of the right femoral head neck junction that  could reflect a nondisplaced fracture or some projectional periarticular spurring. There is a suspicious lucency along the junction of the left inferior pubic ramus and pubic body best seen on the lateral radiograph.  The patient was given IV Protonix bolus and infusion, IV octreotide bolus and infusion, 1 g of IV Rocephin and IV Mucomyst   Assessment & Plan:   Principal Problem:   Sepsis ([place]) Active Problems:   Acute blood loss anemia   GIB (gastrointestinal bleeding)   Transaminitis   Hyponatremia   Hypokalemia   Falls   Hip pain   Rhabdomyolysis   Hypovolemia   Hypotension   Acute symptomatic blood loss anemia secondary to GI bleeding. -On admission received 2 units of PRBC, hemoglobin remained low 6.4, 5.9  >>> 7.8 -Additional 2 units of PRBC transfusion was given yesterday 01/25/2020  -Discussed with GI and hematology -the benefit outweighs the risks with blood transfusion Therefore more pursue with blood transfusion this morning  Status post transfusion with 2 units of PRBC 01/24/2020, hemoglobin this morning still at 6.4, no signs of bleeding Another 2 units of PRBC transfusion  -Hemoccult positive, no active signs of bleeding Hemoglobin on admission 6.7, 6.6,>>> 2 U PRBC >> 6.4 >>>  5.9   >>>7.8 today -We will follow posttransfusion H&H. -We will continue him on IV Protonix drip and octreotide drip. -GI consulted -withholding EGD due to hypotension, severe anemia, hypocalcemia, fever -GI following --- appreciate and close follow-up and recommendations  SIRS/sepsis fever of unknown origin -meets the criteria for T-max 102.9, tachypneic 27 hypotensive 90/57 (But this is complicated by severe anemia, hypovolemia, rhabdomyolysis -due to fall, alcohol intoxication) -This morning still tachypneic, tachycardic, but hypotensive with a blood pressure 183/83  -On 321 T-max of 102.9, WBC of 9.9, lactic acid 1.3, 1.5, procalcitonin 0.13, -Blood cultures >>>  -Patient has been on  Rocephin, will be switched to cefepime   Rhabdomyelitis -Monitoring CK level, creatinine level -Continue IV fluids >>> switch to lactic ringer 150 mL an hour   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 -Initiating alcohol detox protocol [facility] -Monitoring LFTs -Monitoring for severe withdrawal, if needed will utilize Librium versus Precedex -Patient discriminant liver function is> 32, 39 concerning steroid initiation due to alcohol hepatitis but due to fever without source of infection at this time will withhold steroids as this risks outweighs the benefit.  Hypokalemia/hypocalcemia hypomagnesemia Repleting with calcium gluconate, check an ionized calcium. -Monitoring and repleting accordingly   Hyponatremia. -Likely due to beer azotemia, monitoring closely -Stable  Fall with subsequent multiple bruises / contusions /hematoma/ruled out the possibility of  Rt femoral head neck fracture, inferior pubic ramus fracture  -Orthopedic consultation will be obtained.... Appreciate input -hip CT scan >> revealing extensive hematoma negative for any acute fractures -Notified Dr. Roland Rack about the patient..  Dyslipidemia. -Statin therapy will be held off given acute hepatitis.    Essential hypertension. -We will continue Toprol-XL.   DVT prophylaxis.-SCDs. -Medical prophylaxis currently contraindicated due to his associated alcohol-related chronic liver disease and subsequent coagulopathy... Also extensive hematoma CODE STATUS: Full code Family communication -no family member present at bedside-discussed with the patient  Status is: Inpatient  Remains inpatient appropriate because:Hemodynamically unstable, Persistent severe electrolyte disturbances, Ongoing diagnostic testing needed not appropriate for outpatient work up, Unsafe d/c plan, IV treatments appropriate due to intensity of illness or inability to take PO and Inpatient level of care  appropriate due to severity of illness   Dispo: The patient is from: Home  Anticipated d/c is to: Home  Anticipated d/c date is: 2 days  Patient currently is not medically stable to d/c.      Consultants:GI/ Ortho      Procedures:   No admission procedures for hospital encounter.    Antimicrobials:  Anti-infectives (From admission, onward)   Start     Dose/Rate Route Frequency Ordered Stop   01/24/20 1600  cefTRIAXone (ROCEPHIN) 2 g in sodium chloride 0.9 % 100 mL IVPB        2 g 200 mL/hr over 30 Minutes Intravenous Every 24 hours 01/24/20 1520     01/23/20 2045  cefTRIAXone (ROCEPHIN) 1 g in sodium chloride 0.9 % 100 mL IVPB        1 g 200 mL/hr over 30 Minutes Intravenous  Once 01/23/20 2042 01/23/20 2216       Medication:  . sodium chloride   Intravenous Once  . folic acid  1 mg Oral Daily  . furosemide  20 mg Intravenous Once  . metoprolol succinate  25 mg Oral Daily  . multivitamin with minerals  1 tablet Oral Daily  . thiamine  100 mg Oral Daily   Or  . thiamine  100 mg Intravenous  Daily    acetaminophen **OR** acetaminophen, LORazepam **OR** LORazepam, morphine injection, ondansetron **OR** ondansetron (ZOFRAN) IV, traZODone   Objective:   Vitals:   01/26/20 0845 01/26/20 0900 01/26/20 1000 01/26/20 1015  BP:  (!) 143/65 126/67   Pulse: (!) 101   92  Resp: 14  (!) 44 18  Temp:      TempSrc:      SpO2: 95%   97%  Weight:      Height:        Intake/Output Summary (Last 24 hours) at 01/26/2020 1055 Last data filed at 01/26/2020 0543 Gross per 24 hour  Intake 8073.89 ml  Output 925 ml  Net 7148.89 ml   Filed Weights   01/24/20 1058  Weight: 108.9 kg     Examination:     Physical Exam:   General:  Alert, oriented, cooperative, no distress;   HEENT:  Normocephalic, PERRL, otherwise with in Normal limits -extensive neck hematoma  Neuro:  CNII-XII intact. , normal motor and sensation, reflexes  intact   Lungs:   Clear to auscultation BL, Respirations unlabored, no wheezes / crackles  Cardio:    S1/S2, RRR, No murmure, No Rubs or Gallops   Abdomen:   Soft, non-tender, bowel sounds active all four quadrants,  no guarding or peritoneal signs.  Muscular skeletal:  Limited exam - in bed, able to move all 4 extremities, Normal strength,  2+ pulses,  symmetric, No pitting edema  Skin:   Jaundice dry, warm to touch, negative for any Rashes, No open wounds  Wounds: Please see nursing documentation   Extensive bruises, hematoma on lower extremities, facial, large neck hematoma ... Otherwise dry, warm to touch, negative for any Rashes,  Wounds: None visible-per nursing documentation    ------------------------------------------------------------------------------------------------------------------------------------------    LABs:  CBC Latest Ref Rng & Units 01/26/2020 01/26/2020 01/25/2020  WBC 4.0 - 10.5 K/uL - 10.8(H) -  Hemoglobin 13.0 - 17.0 g/dL 7.8(L) 7.4(L) 6.1(L)  Hematocrit 39 - 52 % 22.4(L) 22.2(L) 18.0(L)  Platelets 150 - 400 K/uL - 88(L) -   CMP Latest Ref Rng & Units 01/26/2020 01/25/2020 01/25/2020  Glucose 70 - 99 mg/dL 638(V) 564(P) -  BUN 6 - 20 mg/dL 32(R) 51(O) -  Creatinine 0.61 - 1.24 mg/dL 8.41 6.60 -  Sodium 630 - 145 mmol/L 134(L) 134(L) -  Potassium 3.5 - 5.1 mmol/L 4.1 4.2 -  Chloride 98 - 111 mmol/L 103 102 -  CO2 22 - 32 mmol/L 25 24 -  Calcium 8.9 - 10.3 mg/dL 6.7(L) 6.8(L) -  Total Protein 6.5 - 8.1 g/dL 1.6(W) 5.6(L) 5.3(L)  Total Bilirubin 0.3 - 1.2 mg/dL 16.1(H) 16.5(H) 15.8(H)  Alkaline Phos 38 - 126 U/L 37(L) 39 36(L)  AST 15 - 41 U/L 190(H) 194(H) 207(H)  ALT 0 - 44 U/L 58(H) 61(H) 67(H)       Micro Results Recent Results (from the past 240 hour(s))  SARS Coronavirus 2 by RT PCR (hospital order, performed in [facility] hospital lab) Nasopharyngeal Nasopharyngeal Swab     Status: None   Collection Time: 01/23/20  7:35 PM   Specimen: Nasopharyngeal  Swab  Result Value Ref Range Status   SARS Coronavirus 2 NEGATIVE NEGATIVE Final    Comment: (NOTE) SARS-CoV-2 target nucleic acids are NOT DETECTED.  The SARS-CoV-2 RNA is generally detectable in upper and lower respiratory specimens during the acute phase of infection. The lowest concentration of SARS-CoV-2 viral copies this assay can detect is 250 copies / mL. A negative  result does not preclude SARS-CoV-2 infection and should not be used as the sole basis for treatment or other patient management decisions.  A negative result may occur with improper specimen collection / handling, submission of specimen other than nasopharyngeal swab, presence of viral mutation(s) within the areas targeted by this assay, and inadequate number of viral copies (<250 copies / mL). A negative result must be combined with clinical observations, patient history, and epidemiological information.  Fact Sheet for Patients:   StrictlyIdeas.no  Fact Sheet for Healthcare Providers: BankingDealers.co.za  This test is not yet approved or  cleared by the [place] FDA and has been authorized for detection and/or diagnosis of SARS-CoV-2 by FDA under an Emergency Use Authorization (EUA).  This EUA will remain in effect (meaning this test can be used) for the duration of the COVID-19 declaration under Section 564(b)(1) of the Act, 21 U.S.C. section 360bbb-3(b)(1), unless the authorization is terminated or revoked sooner.  Performed at [facility], [address] [place], [place] [address]   CULTURE, BLOOD (ROUTINE X 2) w Reflex to ID Panel     Status: None (Preliminary result)   Collection Time: 01/24/20  4:15 PM   Specimen: BLOOD  Result Value Ref Range Status   Specimen Description BLOOD RIGHT ANTECUBITAL  Final   Special Requests   Final    BOTTLES DRAWN AEROBIC AND ANAEROBIC Blood Culture adequate volume   Culture   Final    NO GROWTH 2  DAYS Performed at [facility], [address], [place], [place] [address]    Report Status PENDING  Incomplete  CULTURE, BLOOD (ROUTINE X 2) w Reflex to ID Panel     Status: None (Preliminary result)   Collection Time: 01/24/20  4:15 PM   Specimen: BLOOD  Result Value Ref Range Status   Specimen Description BLOOD LEFT ANTECUBITAL  Final   Special Requests   Final    BOTTLES DRAWN AEROBIC AND ANAEROBIC Blood Culture adequate volume   Culture   Final    NO GROWTH 2 DAYS Performed at [facility], [address], [place], [place] [address]    Report Status PENDING  Incomplete    Radiology Reports CT PELVIS WO CONTRAST  Result Date: 01/24/2020 CLINICAL DATA:  Pain, question fracture EXAM: CT PELVIS WITHOUT CONTRAST TECHNIQUE: Multidetector CT imaging of the pelvis was performed following the standard protocol without intravenous contrast. COMPARISON:  None. FINDINGS: Urinary Tract: The visualized distal ureters and bladder appear unremarkable. Bowel: No bowel wall thickening, distention or surrounding inflammation identified within the pelvis. Vascular/Lymphatic: No enlarged pelvic lymph nodes identified. No significant vascular findings. Reproductive: The prostate is unremarkable. Other: Bilateral subcutaneous edema seen around the hips. Musculoskeletal: No fracture or dislocation. 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 There is a large heterogeneous hyperdense areas seen throughout the right gluteal musculature with expansion most notable within the gluteus medius muscle belly, likely intramuscular hematoma. There is also a small subcutaneous hyperdense collections seen overlying the left gluteal musculature, likely superficial hematomas. IMPRESSION: No acute osseous abnormality. Large intramuscular hematoma within the right gluteal musculature. Overlying subcutaneous edema and skin thickening.  superficial subcutaneous small hematoma seen overlying the left gluteal musculature. Electronically Signed   By: Prudencio Pair M.D.   On: 01/24/2020 02:45   DG Hip Unilat W or Wo Pelvis 2-3 Views Right  Result Date: 01/23/2020 CLINICAL DATA:  Right hip pain post fall EXAM: DG HIP (WITH OR  WITHOUT PELVIS) 2-3V RIGHT COMPARISON:  None. FINDINGS: There is marked lateral soft tissue swelling of the right hip. Suspicious for overlying contusive change and/or hematoma. There is a questionable lucency along the posterolateral aspect of the right femoral head neck junction which could reflect a nondisplaced fracture or some projectional periarticular spurring. Additional suspicious lucency is seen along the junction of the left inferior pubic ramus and pubic body best seen on the lateral hip radiograph. Could correlate for point tenderness in this location. There is a background of diffuse degenerative change in the lower spine, hips and pelvis. Enthesopathic changes noted as well at the iliac crest, ischial tuberosities and lesser trochanters bilaterally. IMPRESSION: 1. Marked lateral soft tissue swelling of the right hip. Suspicious for overlying contusive change and/or hematoma. 2. Questionable lucency along the posterolateral aspect of the right femoral head neck junction which could reflect a nondisplaced fracture or some projectional periarticular spurring. 3. Additional suspicious lucency along the junction of the left inferior pubic ramus and pubic body best seen on the lateral radiograph. 4. Given these latter findings, further evaluation with a bony pelvis CT could be obtained for more definitive assessment. Electronically Signed   By: Lovena Le M.D.   On: 01/23/2020 21:55   US Abdomen Limited RUQ  Result Date: 01/23/2020 CLINICAL DATA:  Elevated liver enzymes EXAM: ULTRASOUND ABDOMEN LIMITED RIGHT UPPER QUADRANT COMPARISON:  None. FINDINGS: Gallbladder: No gallstones or wall thickening visualized. No  sonographic Murphy sign noted by sonographer. Common bile duct: Diameter: 5.9 mm Liver: Increased echogenicity seen throughout the liver parenchyma. No focal hepatic lesion. No biliary ductal dilatation. The portal vein is patent, with reversal of flow however and recanalized umbilical vein. Other: None. IMPRESSION: Hepatic steatosis Reversal of flow in the portal vein with findings suggestive of portal hypertension Electronically Signed   By: Prudencio Pair M.D.   On: 01/23/2020 21:21    Total of 79 minutes of critical time was spent in evaluating reviewing labs discussing plan of care with nursing staff, consultants--- drawing including treatment for today. Stabilizing the patient with blood transfusion IV fluids.Marland Kitchen   SIGNED: Deatra James, MD, FACP, FHM. [facility],  Pager (please use [email or web address] to page/text)  If 7PM-7AM, please contact night-coverage [email or web address] 01/26/2020, 10:55 AM

## 2020-01-26 NOTE — ED Notes (Signed)
Pharm called for more protonix

## 2020-01-26 NOTE — ED Notes (Signed)
Pt had a bowel movement, grabbed with hands and smeared on self and bedding. Pt changed, cleansed, and increased agitation. Will ativan per [facility]WA protocol

## 2020-01-26 NOTE — ED Notes (Signed)
Per Admitting Provider, patient can have water and ice chips.

## 2020-01-26 NOTE — ED Notes (Signed)
Pt removed two IV's by self, bled onto sheets and self. Bleeding stopped, controlled, sites bandaged. Pt unable to verbalize why he removed IV's   Pt removed EKG leads, pulse ox, and bedding. Changed and cleansed

## 2020-01-26 NOTE — ED Notes (Signed)
Slowed precedex down per BP and lack of pt agitation   Messaging NP regarding pt BP

## 2020-01-26 NOTE — ED Notes (Signed)
Pt gives verbal consent to talk to [facility], listed in chart, about care

## 2020-01-27 ENCOUNTER — Inpatient Hospital Stay: Payer: Self-pay

## 2020-01-27 DIAGNOSIS — K759 Inflammatory liver disease, unspecified: Secondary | ICD-10-CM

## 2020-01-27 DIAGNOSIS — M25551 Pain in right hip: Secondary | ICD-10-CM

## 2020-01-27 LAB — COMPREHENSIVE METABOLIC PANEL
ALT: 55 U/L — ABNORMAL HIGH (ref 0–44)
AST: 164 U/L — ABNORMAL HIGH (ref 15–41)
Albumin: 2.5 g/dL — ABNORMAL LOW (ref 3.5–5.0)
Alkaline Phosphatase: 38 U/L (ref 38–126)
Anion gap: 8 (ref 5–15)
BUN: 37 mg/dL — ABNORMAL HIGH (ref 6–20)
CO2: 26 mmol/L (ref 22–32)
Calcium: 6.9 mg/dL — ABNORMAL LOW (ref 8.9–10.3)
Chloride: 102 mmol/L (ref 98–111)
Creatinine, Ser: 0.71 mg/dL (ref 0.61–1.24)
GFR calc Af Amer: >60 mL/min (ref >60)
GFR calc non Af Amer: >60 mL/min (ref >60)
Glucose, Bld: 114 mg/dL — ABNORMAL HIGH (ref 70–99)
Potassium: 4.6 mmol/L (ref 3.5–5.1)
Sodium: 136 mmol/L (ref 135–145)
Total Bilirubin: 16.1 mg/dL — ABNORMAL HIGH (ref 0.3–1.2)
Total Protein: 5.6 g/dL — ABNORMAL LOW (ref 6.5–8.1)

## 2020-01-27 LAB — TYPE AND SCREEN
ABO/RH(D): O POS
Antibody Screen: NEGATIVE
Unit division: 0
Unit division: 0
Unit division: 0

## 2020-01-27 LAB — BPAM RBC
Blood Product Expiration Date: 202109262359
Blood Product Expiration Date: 202109262359
Blood Product Expiration Date: 202110012359
ISSUE DATE / TIME: 202109012227
ISSUE DATE / TIME: 202109030840
ISSUE DATE / TIME: 202109031253
Unit Type and Rh: 5100
Unit Type and Rh: 5100
Unit Type and Rh: 5100

## 2020-01-27 LAB — HEMOGLOBIN AND HEMATOCRIT, BLOOD
HCT: 21.9 % — ABNORMAL LOW (ref 39.0–52.0)
HCT: 24 % — ABNORMAL LOW (ref 39.0–52.0)
HCT: 23.8 % — ABNORMAL LOW (ref 39.0–52.0)
Hemoglobin: 7.1 g/dL — ABNORMAL LOW (ref 13.0–17.0)
Hemoglobin: 8 g/dL — ABNORMAL LOW (ref 13.0–17.0)
Hemoglobin: 8.3 g/dL — ABNORMAL LOW (ref 13.0–17.0)

## 2020-01-27 LAB — CBC
HCT: 21.1 % — ABNORMAL LOW (ref 39.0–52.0)
Hemoglobin: 6.9 g/dL — ABNORMAL LOW (ref 13.0–17.0)
MCH: 33.7 pg (ref 26.0–34.0)
MCHC: 32.7 g/dL (ref 30.0–36.0)
MCV: 102.9 fL — ABNORMAL HIGH (ref 80.0–100.0)
Platelets: 97 10*3/uL — ABNORMAL LOW (ref 150–400)
RBC: 2.05 MIL/uL — ABNORMAL LOW (ref 4.22–5.81)
RDW: 23.8 % — ABNORMAL HIGH (ref 11.5–15.5)
WBC: 11 10*3/uL — ABNORMAL HIGH (ref 4.0–10.5)
nRBC: 0.5 % — ABNORMAL HIGH (ref 0.0–0.2)

## 2020-01-27 LAB — PREPARE RBC (CROSSMATCH)

## 2020-01-27 LAB — PROCALCITONIN: Procalcitonin: 1.65 ng/mL

## 2020-01-27 LAB — LACTIC ACID, PLASMA: Lactic Acid, Venous: 1.2 mmol/L (ref 0.5–1.9)

## 2020-01-27 LAB — CK: Total CK: 1507 U/L — ABNORMAL HIGH (ref 49–397)

## 2020-01-27 MED ORDER — SODIUM CHLORIDE 0.9 % IV SOLN
INTRAVENOUS | Status: DC | PRN
  Administered 2020-01-27 – 2020-02-10 (×5): 250 mL via INTRAVENOUS

## 2020-01-27 MED ORDER — CALCIUM GLUCONATE-NACL 2-0.675 GM/100ML-% IV SOLN
2.0000 g | Freq: Once | INTRAVENOUS | Status: AC
  Administered 2020-01-27: 2000 mg via INTRAVENOUS
  Filled 2020-01-27: qty 100

## 2020-01-27 MED ORDER — SODIUM CHLORIDE 0.9% IV SOLUTION
Freq: Once | INTRAVENOUS | Status: DC
  Filled 2020-01-27: qty 250

## 2020-01-27 MED ORDER — LORAZEPAM 2 MG/ML IJ SOLN
INTRAMUSCULAR | Status: AC
  Filled 2020-01-27: qty 1

## 2020-01-27 MED ORDER — LORAZEPAM 2 MG/ML IJ SOLN
1.0000 mg | INTRAMUSCULAR | Status: DC | PRN
  Administered 2020-01-27 (×2): 3 mg via INTRAVENOUS
  Administered 2020-01-27 (×2): 4 mg via INTRAVENOUS
  Administered 2020-01-28: 3 mg via INTRAVENOUS
  Filled 2020-01-27 (×5): qty 2

## 2020-01-27 MED ORDER — LORAZEPAM 2 MG/ML IJ SOLN
1.0000 mg | Freq: Four times a day (QID) | INTRAMUSCULAR | Status: DC | PRN
  Administered 2020-01-27: 1 mg via INTRAVENOUS

## 2020-01-27 MED ORDER — LORAZEPAM 1 MG PO TABS: 1.0000 mg | ORAL_TABLET | ORAL | Status: DC | PRN

## 2020-01-27 MED ORDER — QUETIAPINE FUMARATE 25 MG PO TABS
25.0000 mg | ORAL_TABLET | Freq: Every day | ORAL | Status: DC
  Administered 2020-01-27: 25 mg via ORAL
  Filled 2020-01-27: qty 1

## 2020-01-27 NOTE — ED Notes (Signed)
Pt urinated but dropped urinal. Pt blankets removed and provided clean blanket. Moved up in bed.

## 2020-01-27 NOTE — ED Notes (Signed)
Attempted to call receiving floor. Gave name and number to charge RN Judeth Cornfield. Awaiting return call.

## 2020-01-27 NOTE — Progress Notes (Signed)
PROGRESS NOTE    Patient: [name]                            PCP: Remi Haggard, FNP                    DOB: [birth year]            DOA: 01/23/2020 IRC:[id number]             DOS: 01/27/2020, 10:39 AM   LOS: 4 days   Date of Service: The patient was seen and examined on 01/27/2020  Subjective:   The patient was seen and examined this morning, much more awake alert, following, complaining of generalized aches and pain, hip pain, Bruises hematoma has been the same  Still feeling lethargic, weak, tachypneic with respiratory rate of 35, tachycardic heart rate of 106 this morning temp 98, blood pressure much improved 145/70 satting 99% on5 L of oxygen via nasal cannula   Brief Narrative:   [name]  is a 49 y.o. Caucasian male with a known history of polysubstance abuse including alcohol abuse who presented to the emergency room with acute onset of increased generalized weakness and a mechanical fall.  He believes that he lost balance and fell with subsequent right hip pain he usually drinks 5-6 beer at night.    ED: Heart rate was 106 and blood pressure 109/43 with pulse oximetry of 92 and later 96% on room air and otherwise normal vital signs.   Labs revealed hypokalemia of 3.3, anion gap of 15 albumin of 3 total 36.4, AST of 293 and ALT 83 with alk phos of 53 and total bili of 14.2. Ammonia level was 42.   CBC showed no leukocytosis of 11.6 and significant anemia with hemoglobin 6.7 hematocrit 19.1 compared to 10.4 and 29.5 on 01/20/2020 with thrombocytopenia of 123.  INR is 1.5 and PT 17.3.   COVID-19 PCR came back negative.  Alcohol level was 296.Marland Kitchen   Stool Hemoccult was weakly positive.   Right upper quadrant ultrasound revealed hepatic steatosis with findings suggestive of portal hypertension. Right hip CT showed marked left lower soft tissue swelling suspicious for overlying contusive change and/or hematoma and questionable lucency along the posterolateral aspect of  the right femoral head neck junction that could reflect a nondisplaced fracture or some projectional periarticular spurring. There is a suspicious lucency along the junction of the left inferior pubic ramus and pubic body best seen on the lateral radiograph.  The patient was given IV Protonix bolus and infusion, IV octreotide bolus and infusion, 1 g of IV Rocephin and IV Mucomyst   Assessment & Plan:   Principal Problem:   Sepsis ([place]) Active Problems:   Acute blood loss anemia   GIB (gastrointestinal bleeding)   Transaminitis   Hyponatremia   Hypokalemia   Falls   Hip pain   Rhabdomyolysis   Hypovolemia   Hypotension   Acute symptomatic blood loss anemia secondary to GI bleeding. -H&H stable dropping, per consultants, trying to keep hemoglobin greater than 7, this morning 6.9 again -01/27/2020 another 2 units of PRBC transfusion will be pursued  -Thus far he has received total of 4 units of PRBC blood transfusion since admission, 9 /2. 9/3, 9/5 -Hemoglobin 6.4 >>  5.9  >>> 7.8 >> 6.9 today  -No active signs of bleeding likely due to extensive hematoma throughout.. More notable hip area  -Discussed with GI and hematology -  the benefit outweighs the risks with blood transfusion Therefore more pursue with blood transfusion this morning    -Hemoccult positive, no active signs of bleeding Hemoglobin on admission 6.7, 6.6,>>> 2 U PRBC >> 6.4 >>> 5.9   >>>7.8 ...6.9  today -We will follow posttransfusion H&H. -We will continue him on IV Protonix drip and octreotide drip. -GI consulted -withholding EGD due to hypotension, severe anemia, hypocalcemia, fever -GI following --- appreciate and close follow-up and recommendations  SIRS/sepsis fever of unknown origin -meets the criteria for T-max 102.9, tachypneic 27 hypotensive 90/57 (But this is complicated by severe anemia, hypovolemia, rhabdomyolysis -due to fall, alcohol intoxication) -This morning still tachypneic, tachycardic, but  hypotensive with a blood pressure 183/83  -On 321 T-max of 102.9, WBC of 9.9, lactic acid 1.3, 1.5, procalcitonin 0.13, -Blood cultures >>>  -Patient has been on Rocephin, will be switched to cefepime   Rhabdomyelitis -Monitoring total CK level, 2165 >>> 1507 today -Continue IV fluids >>> switch to lactic ringer 150 ml/ hour   Alcoholic hepatitis with early hepatic cell failure and current alcohol intoxication. -Initiating alcohol detox protocol CIWA -Monitoring LFTs >>> improving AST ALT alk phos still elevated T bili -Monitoring for severe withdrawal, if needed will utilize Librium versus Precedex -Patient discriminant liver function is> 32, 39 concerning steroid initiation due to alcohol hepatitis but due to fever without source of infection at this time will withhold steroids as this risks outweighs the benefit.  Hypokalemia/hypocalcemia hypomagnesemia -Once again repleted calcium gluconate 2 g 9/ 4, 9 /5 -Monitoring and repleting accordingly    Hyponatremia. -Likely due to beer azotemia, monitoring closely -Stable  Fall with subsequent multiple bruises / contusions /hematoma/ruled out the possibility of  Rt femoral head neck fracture, inferior pubic ramus fracture  -Orthopedic consulted, following closely  -Progressively worsening right hip tenderness edema with ecchymosis  -hip CT scan >> revealing extensive hematoma negative for any acute fractures -Notified Dr. Joice Lofts about the patient..  Dyslipidemia. -Statin therapy will be held off given acute hepatitis.    Essential hypertension. -We will continue Toprol-XL.   DVT prophylaxis.-SCDs. -Medical prophylaxis currently contraindicated due to his associated alcohol-related chronic liver disease and subsequent coagulopathy... Also extensive hematoma CODE STATUS: Full code Family communication -no family member present at bedside-discussed with the patient  Status is: Inpatient  Remains inpatient appropriate  because:Hemodynamically unstable, Persistent severe electrolyte disturbances, Ongoing diagnostic testing needed not appropriate for outpatient work up, Unsafe d/c plan, IV treatments appropriate due to intensity of illness or inability to take PO and Inpatient level of care appropriate due to severity of illness   Dispo: The patient is from: Home  Anticipated d/c is to: Home  Anticipated d/c date is: 2 days  Patient currently is not medically stable to d/c.      Consultants:GI/ Ortho   Procedures:   No admission procedures for hospital encounter.   Antimicrobials:  Anti-infectives (From admission, onward)   Start     Dose/Rate Route Frequency Ordered Stop   01/24/20 1600  cefTRIAXone (ROCEPHIN) 2 g in sodium chloride 0.9 % 100 mL IVPB        2 g 200 mL/hr over 30 Minutes Intravenous Every 24 hours 01/24/20 1520     01/23/20 2045  cefTRIAXone (ROCEPHIN) 1 g in sodium chloride 0.9 % 100 mL IVPB        1 g 200 mL/hr over 30 Minutes Intravenous  Once 01/23/20 2042 01/23/20 2216       Medication:   sodium chloride  Intravenous Once   sodium chloride   Intravenous Once   sodium chloride   Intravenous Once   sodium chloride   Intravenous Once   folic acid  1 mg Oral Daily   furosemide  20 mg Intravenous Once   metoprolol succinate  25 mg Oral Daily   multivitamin with minerals  1 tablet Oral Daily   thiamine  100 mg Oral Daily   Or   thiamine  100 mg Intravenous Daily    acetaminophen **OR** acetaminophen, morphine injection, ondansetron **OR** ondansetron (ZOFRAN) IV, traZODone   Objective:   Vitals:   01/27/20 0902 01/27/20 0910 01/27/20 0953 01/27/20 1000  BP: 139/67 139/67 (!) 150/68 (!) 145/70  Pulse: 99 99 (!) 103 (!) 106  Resp: (!) 34 (!) 34 (!) 25 (!) 35  Temp: 98 F (36.7 C) 98 F (36.7 C) 98 F (36.7 C)   TempSrc: Oral  Oral   SpO2: 100% 100% 100% 99%  Weight:      Height:        Intake/Output  Summary (Last 24 hours) at 01/27/2020 1039 Last data filed at 01/27/2020 0857 Gross per 24 hour  Intake --  Output 950 ml  Net -950 ml   Filed Weights   01/24/20 1058  Weight: 108.9 kg     Examination:       Physical Exam:   General:   More awake this a.m., cooperative  HEENT:  Normocephalic, PERRL, otherwise with in Normal limits   Neuro:  CNII-XII intact. , normal motor and sensation, reflexes intact   Lungs:   Clear to auscultation BL, Respirations unlabored, no wheezes / crackles  Cardio:    S1/S2, RRR, No murmure, No Rubs or Gallops   Abdomen:   Soft, non-tender, bowel sounds active all four quadrants,  no guarding or peritoneal signs.  Muscular skeletal:  Limited exam - in bed, able to move all 4 extremities, Normal strength,  2+ pulses,  symmetric, No pitting edema  Skin:  Dry, warm to touch, negative for any Rashes, extensive superficial wounds with extensive ecchymosis, hematoma right hip, extensive hematoma left neck/facial area  Wounds: Please see nursing documentation  Improving jaundice, extensive bruises, hematoma on lower extremities, facial, large neck hematoma ... Otherwise dry, warm to touch, negative for any Rashes,  Wounds: None visible-per nursing documentation    ------------------------------------------------------------------------------------------------------------------------------------------    LABs:  CBC Latest Ref Rng & Units 01/27/2020 01/26/2020 01/26/2020  WBC 4.0 - 10.5 K/uL 11.0(H) - -  Hemoglobin 13.0 - 17.0 g/dL 6.9(L) 7.4(L) 7.2(L)  Hematocrit 39 - 52 % 21.1(L) 21.6(L) 20.5(L)  Platelets 150 - 400 K/uL 97(L) - -   CMP Latest Ref Rng & Units 01/27/2020 01/26/2020 01/25/2020  Glucose 70 - 99 mg/dL 114(H) 120(H) 106(H)  BUN 6 - 20 mg/dL 37(H) 31(H) 32(H)  Creatinine 0.61 - 1.24 mg/dL 0.71 1.08 1.16  Sodium 135 - 145 mmol/L 136 134(L) 134(L)  Potassium 3.5 - 5.1 mmol/L 4.6 4.1 4.2  Chloride 98 - 111 mmol/L 102 103 102  CO2 22 - 32 mmol/L $RemoveB'26 25  24  'qpbbEqnv$ Calcium 8.9 - 10.3 mg/dL 6.9(L) 6.7(L) 6.8(L)  Total Protein 6.5 - 8.1 g/dL 5.6(L) 5.6(L) 5.6(L)  Total Bilirubin 0.3 - 1.2 mg/dL 16.1(H) 16.1(H) 16.5(H)  Alkaline Phos 38 - 126 U/L 38 37(L) 39  AST 15 - 41 U/L 164(H) 190(H) 194(H)  ALT 0 - 44 U/L 55(H) 58(H) 61(H)       Micro Results Recent Results (from the past 240 hour(s))  SARS Coronavirus 2 by RT PCR (hospital order, performed in [facility] hospital lab) Nasopharyngeal Nasopharyngeal Swab     Status: None   Collection Time: 01/23/20  7:35 PM   Specimen: Nasopharyngeal Swab  Result Value Ref Range Status   SARS Coronavirus 2 NEGATIVE NEGATIVE Final    Comment: (NOTE) SARS-CoV-2 target nucleic acids are NOT DETECTED.  The SARS-CoV-2 RNA is generally detectable in upper and lower respiratory specimens during the acute phase of infection. The lowest concentration of SARS-CoV-2 viral copies this assay can detect is 250 copies / mL. A negative result does not preclude SARS-CoV-2 infection and should not be used as the sole basis for treatment or other patient management decisions.  A negative result may occur with improper specimen collection / handling, submission of specimen other than nasopharyngeal swab, presence of viral mutation(s) within the areas targeted by this assay, and inadequate number of viral copies (<250 copies / mL). A negative result must be combined with clinical observations, patient history, and epidemiological information.  Fact Sheet for Patients:   StrictlyIdeas.no  Fact Sheet for Healthcare Providers: BankingDealers.co.za  This test is not yet approved or  cleared by the [place] FDA and has been authorized for detection and/or diagnosis of SARS-CoV-2 by FDA under an Emergency Use Authorization (EUA).  This EUA will remain in effect (meaning this test can be used) for the duration of the COVID-19 declaration under Section 564(b)(1) of the  Act, 21 U.S.C. section 360bbb-3(b)(1), unless the authorization is terminated or revoked sooner.  Performed at [facility], [address] [place], [place] [address]   CULTURE, BLOOD (ROUTINE X 2) w Reflex to ID Panel     Status: None (Preliminary result)   Collection Time: 01/24/20  4:15 PM   Specimen: BLOOD  Result Value Ref Range Status   Specimen Description BLOOD RIGHT ANTECUBITAL  Final   Special Requests   Final    BOTTLES DRAWN AEROBIC AND ANAEROBIC Blood Culture adequate volume   Culture   Final    NO GROWTH 3 DAYS Performed at [facility], [address], [place], [place] [address]    Report Status PENDING  Incomplete  CULTURE, BLOOD (ROUTINE X 2) w Reflex to ID Panel     Status: None (Preliminary result)   Collection Time: 01/24/20  4:15 PM   Specimen: BLOOD  Result Value Ref Range Status   Specimen Description BLOOD LEFT ANTECUBITAL  Final   Special Requests   Final    BOTTLES DRAWN AEROBIC AND ANAEROBIC Blood Culture adequate volume   Culture   Final    NO GROWTH 3 DAYS Performed at [facility], [address] [place], [place] [address]    Report Status PENDING  Incomplete    Radiology Reports CT PELVIS WO CONTRAST  Result Date: 01/24/2020 CLINICAL DATA:  Pain, question fracture EXAM: CT PELVIS WITHOUT CONTRAST TECHNIQUE: Multidetector CT imaging of the pelvis was performed following the standard protocol without intravenous contrast. COMPARISON:  None. FINDINGS: Urinary Tract: The visualized distal ureters and bladder appear unremarkable. Bowel: No bowel wall thickening, distention or surrounding inflammation identified within the pelvis. Vascular/Lymphatic: No enlarged pelvic lymph nodes identified. No significant vascular findings. Reproductive: The prostate is unremarkable. Other: Bilateral subcutaneous edema seen around the hips. Musculoskeletal: No fracture or dislocation. There is moderate bilateral hip osteoarthritis with  superior joint space loss and marginal osteophyte formation. There is a large heterogeneous hyperdense areas seen throughout the right gluteal musculature with expansion most  notable within the gluteus medius muscle belly, likely intramuscular hematoma. There is also a small subcutaneous hyperdense collections seen overlying the left gluteal musculature, likely superficial hematomas. IMPRESSION: No acute osseous abnormality. Large intramuscular hematoma within the right gluteal musculature. Overlying subcutaneous edema and skin thickening. superficial subcutaneous small hematoma seen overlying the left gluteal musculature. Electronically Signed   By: Prudencio Pair M.D.   On: 01/24/2020 02:45   DG Hip Unilat W or Wo Pelvis 2-3 Views Right  Result Date: 01/23/2020 CLINICAL DATA:  Right hip pain post fall EXAM: DG HIP (WITH OR WITHOUT PELVIS) 2-3V RIGHT COMPARISON:  None. FINDINGS: There is marked lateral soft tissue swelling of the right hip. Suspicious for overlying contusive change and/or hematoma. There is a questionable lucency along the posterolateral aspect of the right femoral head neck junction which could reflect a nondisplaced fracture or some projectional periarticular spurring. Additional suspicious lucency is seen along the junction of the left inferior pubic ramus and pubic body best seen on the lateral hip radiograph. Could correlate for point tenderness in this location. There is a background of diffuse degenerative change in the lower spine, hips and pelvis. Enthesopathic changes noted as well at the iliac crest, ischial tuberosities and lesser trochanters bilaterally. IMPRESSION: 1. Marked lateral soft tissue swelling of the right hip. Suspicious for overlying contusive change and/or hematoma. 2. Questionable lucency along the posterolateral aspect of the right femoral head neck junction which could reflect a nondisplaced fracture or some projectional periarticular spurring. 3. Additional  suspicious lucency along the junction of the left inferior pubic ramus and pubic body best seen on the lateral radiograph. 4. Given these latter findings, further evaluation with a bony pelvis CT could be obtained for more definitive assessment. Electronically Signed   By: Lovena Le M.D.   On: 01/23/2020 21:55   US Abdomen Limited RUQ  Result Date: 01/23/2020 CLINICAL DATA:  Elevated liver enzymes EXAM: ULTRASOUND ABDOMEN LIMITED RIGHT UPPER QUADRANT COMPARISON:  None. FINDINGS: Gallbladder: No gallstones or wall thickening visualized. No sonographic Murphy sign noted by sonographer. Common bile duct: Diameter: 5.9 mm Liver: Increased echogenicity seen throughout the liver parenchyma. No focal hepatic lesion. No biliary ductal dilatation. The portal vein is patent, with reversal of flow however and recanalized umbilical vein. Other: None. IMPRESSION: Hepatic steatosis Reversal of flow in the portal vein with findings suggestive of portal hypertension Electronically Signed   By: Prudencio Pair M.D.   On: 01/23/2020 21:21    Total of 79 minutes of critical time was spent in evaluating reviewing labs discussing plan of care with nursing staff, consultants--- drawing including treatment for today. Stabilizing the patient with blood transfusion IV fluids.Marland Kitchen   SIGNED: Deatra James, MD, FACP, FHM. [facility],  Pager (please use [email or web address] to page/text)  If 7PM-7AM, please contact night-coverage [email or web address] Ota [facility] 01/27/2020, 10:39 AM

## 2020-01-27 NOTE — ED Notes (Signed)
Logan, MRI tech, came by room to screen pt before getting him over to MRI. Will call Logan once blood transfusion is finished to obtain MRI.

## 2020-01-27 NOTE — Progress Notes (Addendum)
GI Inpatient Follow-up Note  Subjective:  Patient seen in follow-up for Dr. Servando Snare over the weekend for alcoholic hepatitis and anemia 2/2 hematomas. No acute events overnight. He did have fever of 101.1 early this morning. Hemoglobin 6.9 on morning labs and he is getting 1 unit pRBCs today. Per nursing, two small volume BMs yesterday with no frank blood or tarry stools. He denies abdominal pain, nausea, or vomiting. He does still endorse right hip pain and is receiving pain medication for this. No new complaints.   Scheduled Inpatient Medications:   sodium chloride   Intravenous Once   sodium chloride   Intravenous Once   sodium chloride   Intravenous Once   sodium chloride   Intravenous Once   folic acid  1 mg Oral Daily   furosemide  20 mg Intravenous Once   metoprolol succinate  25 mg Oral Daily   multivitamin with minerals  1 tablet Oral Daily   thiamine  100 mg Oral Daily   Or   thiamine  100 mg Intravenous Daily    Continuous Inpatient Infusions:    calcium gluconate     cefTRIAXone (ROCEPHIN)  IV Stopped (01/26/20 1714)   lactated ringers 150 mL/hr at 01/27/20 0606   octreotide  (SANDOSTATIN)    IV infusion Stopped (01/26/20 0541)    PRN Inpatient Medications:  acetaminophen **OR** acetaminophen, morphine injection, ondansetron **OR** ondansetron (ZOFRAN) IV, traZODone  Review of Systems: Constitutional: Weight is stable.  Eyes: No changes in vision. ENT: No oral lesions, sore throat.  GI: see HPI.  Heme/Lymph: No easy bruising.  CV: No chest pain.  GU: No hematuria.  Integumentary: No rashes.  Neuro: No headaches.  Psych: No depression/anxiety.  Endocrine: No heat/cold intolerance.  Allergic/Immunologic: No urticaria.  Resp: No cough, SOB.  Musculoskeletal: No joint swelling.    Physical Examination: BP (!) 150/68    Pulse (!) 103    Temp 98 F (36.7 C) (Oral)    Resp (!) 25    Ht 5\' 7"  (1.702 m)    Wt 108.9 kg    SpO2 100%    BMI 37.59 kg/m   Gen: NAD, alert and oriented x 4 HEENT: PEERLA, EOMI, +sclera icteric Neck: supple, no JVD or thyromegaly Chest: CTA bilaterally, no wheezes, crackles, or other adventitious sounds CV: RRR, no m/g/c/r Abd: soft, NT, ND, +BS in all four quadrants; no HSM, guarding, ridigity, or rebound tenderness Ext: no edema, well perfused with 2+ pulses, Skin: Multiple areas of large bruising on his lower extremities. Lymph: no LAD  Data: Lab Results  Component Value Date   WBC 11.0 (H) 01/27/2020   HGB 6.9 (L) 01/27/2020   HCT 21.1 (L) 01/27/2020   MCV 102.9 (H) 01/27/2020   PLT 97 (L) 01/27/2020   Recent Labs  Lab 01/26/20 1526 01/26/20 2305 01/27/20 0352  HGB 7.2* 7.4* 6.9*   Lab Results  Component Value Date   NA 136 01/27/2020   K 4.6 01/27/2020   CL 102 01/27/2020   CO2 26 01/27/2020   BUN 37 (H) 01/27/2020   CREATININE 0.71 01/27/2020   Lab Results  Component Value Date   ALT 55 (H) 01/27/2020   AST 164 (H) 01/27/2020   ALKPHOS 38 01/27/2020   BILITOT 16.1 (H) 01/27/2020   Recent Labs  Lab 01/24/20 0403 01/24/20 0403 01/25/20 0759  APTT 36  --   --   INR 1.6*   < > 1.7*   < > = values in this interval not  displayed.   Assessment/Plan:  49 y/o Caucasian male with a PMH of polysubstance abuse including alcohol abuse who was admitted 09/01 for generalized weakness and a mechanical fall found to have severe anemia and alcoholic hepatitis   1. Anemia - likely 2/2 hematomas. NO evidence of overt gastrointestinal bleeding. He has responded appropriately to transfusions. Hgb 6.9 this AM.   2. Alcoholic hepatitis - Madd[place] DF 39. Steroids not given due to fever with no source of infection. Steroids do not prolong long-term survival.  3. SIRS/Sepsis with FUO - fever 101.1 last night  4. Rhabdomyelitis - CK improving 2165-->1507. On IVF hydration LR 150 cc/hour.  5. Metabolic derangement - hyponatremia, hypokalemia, hypocalcemia, hypomagnesemia. Monitored and  repleted by hospital team.    Recommendations:  -Continue to monitor H&H closely and transfuse as needed to ensure Hgb >7 -Continue Protonix and octreotide. Can d/c Octreotide after 72 hours.  -No overt signs of GI bleeding -No plans for endoscopy at present given severe systemic illness and no signs of active hemorrhage -Continue to monitor LFTs daily -CIWA protocol -Prognosis is guarded. Importance of complete alcohol cessation was discussed with patient.   Please call with questions or concerns.    Jacob Moores, PA-C [facility] [phone] (Cell)  George Ina, M.D. ABIM Diplomate in Gastroenterology [facility]

## 2020-01-27 NOTE — ED Notes (Signed)
Pt stating pain 9/10 R hip and requesting morphine and ativan for pain and anxiety. Anxiety level 7/10 per pt.

## 2020-01-27 NOTE — ED Notes (Signed)
Spoke to [employer]omatic Services operational officer on 2A, gave name and number. Awaiting to hear from receiving nurse.

## 2020-01-27 NOTE — ED Notes (Signed)
Informed MRI that blood transfusion is completed. Logan, MRI tech, said that he could come get pt in about 30 minutes for ordered scan.

## 2020-01-27 NOTE — ED Notes (Signed)
Logan from MRI stated that the patient would be able to get his MRI in about another 30 minutes. Still waiting on floor to accept pt.

## 2020-01-27 NOTE — ED Notes (Signed)
Phlebotomist at beside

## 2020-01-27 NOTE — ED Notes (Signed)
Pt taken to MRI  

## 2020-01-27 NOTE — ED Notes (Signed)
Attempted to call receiving floor to check on status, no answer.

## 2020-01-27 NOTE — ED Notes (Signed)
Stephanie from 2A called this RN back and stated that they are DC a pt from floor in about an hour and then the nurse will be able to take this patient. Informed Judeth Cornfield that pt MRI should be performed and done by that time. Verbalized understanding.

## 2020-01-27 NOTE — ED Notes (Signed)
Called lab to request phlebotomist to draw type and screen

## 2020-01-27 NOTE — Progress Notes (Signed)
I was reconsulted on this patien given the patient's continued right gluteal/hip pain as well as persistent need for blood transfusions.  Dr. Joice Lofts had evaluated the patient and written the initial consult note on 02/12/20.  See his note for further details of the history, exam, and initial plan of care.  Patient is more alert today compared to prior few days.  He is having more pain about the right hip/gluteal region.  He notes significant pain with even turning.  He has significant bruising about the right lower extremity.  His exam is concerning for mild-moderate tenderness about the right hip gluteal musculature.  There is mild firmness consistent with no hematoma, but no significant concern currently for a compartment syndrome.  He is grossly neurovascularly intact distally.  I recommend obtaining an MRI of the right hip for further evaluation of right gluteal hematoma as well as to further rule out possible nondisplaced fracture.  Patient may be amenable to IR guided aspiration of the hematoma if there is clinical concern condition necessitates.  We will plan to continue to follow the patient and reexamine tomorrow as well as review MRI once it is complete.

## 2020-01-27 NOTE — ED Notes (Signed)
A cup of water was given to the pt.

## 2020-01-27 NOTE — ED Notes (Signed)
Pt removed external urine catheter. Pt requested urinal at bedside. Given urinal. Canister emptied, input documented.

## 2020-01-28 ENCOUNTER — Inpatient Hospital Stay: Payer: Self-pay

## 2020-01-28 DIAGNOSIS — T796XXA Traumatic ischemia of muscle, initial encounter: Secondary | ICD-10-CM

## 2020-01-28 DIAGNOSIS — J9601 Acute respiratory failure with hypoxia: Secondary | ICD-10-CM

## 2020-01-28 DIAGNOSIS — K7201 Acute and subacute hepatic failure with coma: Secondary | ICD-10-CM

## 2020-01-28 DIAGNOSIS — E861 Hypovolemia: Secondary | ICD-10-CM

## 2020-01-28 LAB — COMPREHENSIVE METABOLIC PANEL
ALT: 58 U/L — ABNORMAL HIGH (ref 0–44)
AST: 158 U/L — ABNORMAL HIGH (ref 15–41)
Albumin: 2.6 g/dL — ABNORMAL LOW (ref 3.5–5.0)
Alkaline Phosphatase: 39 U/L (ref 38–126)
Anion gap: 10 (ref 5–15)
BUN: 31 mg/dL — ABNORMAL HIGH (ref 6–20)
CO2: 27 mmol/L (ref 22–32)
Calcium: 7.8 mg/dL — ABNORMAL LOW (ref 8.9–10.3)
Chloride: 103 mmol/L (ref 98–111)
Creatinine, Ser: 0.55 mg/dL — ABNORMAL LOW (ref 0.61–1.24)
GFR calc Af Amer: >60 mL/min (ref >60)
GFR calc non Af Amer: >60 mL/min (ref >60)
Glucose, Bld: 112 mg/dL — ABNORMAL HIGH (ref 70–99)
Potassium: 4.1 mmol/L (ref 3.5–5.1)
Sodium: 140 mmol/L (ref 135–145)
Total Bilirubin: 19.7 mg/dL (ref 0.3–1.2)
Total Protein: 5.9 g/dL — ABNORMAL LOW (ref 6.5–8.1)

## 2020-01-28 LAB — MRSA PCR SCREENING: MRSA by PCR: NEGATIVE

## 2020-01-28 LAB — BLOOD GAS, ARTERIAL
Acid-Base Excess: 3.4 mmol/L — ABNORMAL HIGH (ref 0.0–2.0)
Bicarbonate: 30.4 mmol/L — ABNORMAL HIGH (ref 20.0–28.0)
FIO2: 0.5
MECHVT: 500 mL
O2 Saturation: 94.5 %
PEEP: 5 cmH2O
Patient temperature: 37
RATE: 16 resp/min
pCO2 arterial: 59 mmHg — ABNORMAL HIGH (ref 32.0–48.0)
pH, Arterial: 7.32 — ABNORMAL LOW (ref 7.350–7.450)
pO2, Arterial: 79 mmHg — ABNORMAL LOW (ref 83.0–108.0)

## 2020-01-28 LAB — PROTIME-INR
INR: 1.6 — ABNORMAL HIGH (ref 0.8–1.2)
Prothrombin Time: 18.7 seconds — ABNORMAL HIGH (ref 11.4–15.2)

## 2020-01-28 LAB — GLUCOSE, CAPILLARY
Glucose-Capillary: 109 mg/dL — ABNORMAL HIGH (ref 70–99)
Glucose-Capillary: 111 mg/dL — ABNORMAL HIGH (ref 70–99)
Glucose-Capillary: 111 mg/dL — ABNORMAL HIGH (ref 70–99)
Glucose-Capillary: 98 mg/dL (ref 70–99)

## 2020-01-28 LAB — TRIGLYCERIDES: Triglycerides: 143 mg/dL (ref <150)

## 2020-01-28 LAB — HEMOGLOBIN AND HEMATOCRIT, BLOOD
HCT: 21.9 % — ABNORMAL LOW (ref 39.0–52.0)
HCT: 23 % — ABNORMAL LOW (ref 39.0–52.0)
HCT: 25 % — ABNORMAL LOW (ref 39.0–52.0)
HCT: 19.6 % — ABNORMAL LOW (ref 39.0–52.0)
Hemoglobin: 7.5 g/dL — ABNORMAL LOW (ref 13.0–17.0)
Hemoglobin: 7.5 g/dL — ABNORMAL LOW (ref 13.0–17.0)
Hemoglobin: 8.4 g/dL — ABNORMAL LOW (ref 13.0–17.0)
Hemoglobin: 6.6 g/dL — ABNORMAL LOW (ref 13.0–17.0)

## 2020-01-28 LAB — CK: Total CK: 932 U/L — ABNORMAL HIGH (ref 49–397)

## 2020-01-28 LAB — AMMONIA: Ammonia: 43 umol/L — ABNORMAL HIGH (ref 9–35)

## 2020-01-28 LAB — PREPARE RBC (CROSSMATCH)

## 2020-01-28 MED ORDER — ORAL CARE MOUTH RINSE
15.0000 mL | OROMUCOSAL | Status: DC
  Administered 2020-01-28 – 2020-02-03 (×55): 15 mL via OROMUCOSAL

## 2020-01-28 MED ORDER — CHLORHEXIDINE GLUCONATE CLOTH 2 % EX PADS
6.0000 | MEDICATED_PAD | Freq: Every day | CUTANEOUS | Status: DC
  Administered 2020-01-28 – 2020-02-13 (×15): 6 via TOPICAL

## 2020-01-28 MED ORDER — SODIUM CHLORIDE 0.9% FLUSH
10.0000 mL | Freq: Two times a day (BID) | INTRAVENOUS | Status: DC
  Administered 2020-01-28 – 2020-02-13 (×24): 10 mL

## 2020-01-28 MED ORDER — PROPOFOL 1000 MG/100ML IV EMUL
5.0000 ug/kg/min | INTRAVENOUS | Status: DC
  Administered 2020-01-28: 20 ug/kg/min via INTRAVENOUS
  Administered 2020-01-28 – 2020-01-29 (×5): 30 ug/kg/min via INTRAVENOUS
  Administered 2020-01-29: 40 ug/kg/min via INTRAVENOUS
  Administered 2020-01-30 (×2): 30 ug/kg/min via INTRAVENOUS
  Administered 2020-01-30: 40 ug/kg/min via INTRAVENOUS
  Administered 2020-01-30: 30 ug/kg/min via INTRAVENOUS
  Administered 2020-01-30: 40 ug/kg/min via INTRAVENOUS
  Administered 2020-01-30: 30 ug/kg/min via INTRAVENOUS
  Administered 2020-01-31 (×5): 40 ug/kg/min via INTRAVENOUS
  Filled 2020-01-28 (×18): qty 100

## 2020-01-28 MED ORDER — MIDAZOLAM HCL 2 MG/2ML IJ SOLN: 4.0000 mg | Freq: Once | INTRAMUSCULAR | Status: AC

## 2020-01-28 MED ORDER — QUETIAPINE FUMARATE 25 MG PO TABS
50.0000 mg | ORAL_TABLET | Freq: Two times a day (BID) | ORAL | Status: DC
  Administered 2020-01-28 – 2020-01-29 (×2): 50 mg via ORAL
  Filled 2020-01-28 (×2): qty 2

## 2020-01-28 MED ORDER — ETOMIDATE 2 MG/ML IV SOLN
INTRAVENOUS | Status: AC
  Filled 2020-01-28: qty 10

## 2020-01-28 MED ORDER — ETOMIDATE 2 MG/ML IV SOLN: 20.0000 mg | Freq: Once | INTRAVENOUS | Status: DC

## 2020-01-28 MED ORDER — FENTANYL CITRATE (PF) 100 MCG/2ML IJ SOLN
INTRAMUSCULAR | Status: AC
  Administered 2020-01-28: 200 ug via INTRAVENOUS
  Filled 2020-01-28: qty 4

## 2020-01-28 MED ORDER — QUETIAPINE FUMARATE ER 50 MG PO TB24: 100.0000 mg | ORAL_TABLET | Freq: Every day | ORAL | Status: DC

## 2020-01-28 MED ORDER — CHLORHEXIDINE GLUCONATE 0.12% ORAL RINSE (MEDLINE KIT)
15.0000 mL | Freq: Two times a day (BID) | OROMUCOSAL | Status: DC
  Administered 2020-01-28 – 2020-02-13 (×26): 15 mL via OROMUCOSAL

## 2020-01-28 MED ORDER — SODIUM CHLORIDE 0.9 % IV SOLN
2.0000 g | Freq: Three times a day (TID) | INTRAVENOUS | Status: AC
  Administered 2020-01-28 – 2020-02-04 (×21): 2 g via INTRAVENOUS
  Filled 2020-01-28 (×23): qty 2

## 2020-01-28 MED ORDER — MIDAZOLAM HCL 2 MG/2ML IJ SOLN
INTRAMUSCULAR | Status: AC
  Administered 2020-01-28: 4 mg via INTRAVENOUS
  Filled 2020-01-28: qty 4

## 2020-01-28 MED ORDER — SODIUM CHLORIDE 0.9% FLUSH
10.0000 mL | INTRAVENOUS | Status: DC | PRN
  Administered 2020-02-03 – 2020-02-10 (×2): 10 mL

## 2020-01-28 MED ORDER — MIDAZOLAM 50MG/50ML (1MG/ML) PREMIX INFUSION
0.5000 mg/h | INTRAVENOUS | Status: DC
  Administered 2020-01-28: 1 mg/h via INTRAVENOUS
  Administered 2020-01-29: 4 mg/h via INTRAVENOUS
  Administered 2020-01-29: 2 mg/h via INTRAVENOUS
  Filled 2020-01-28 (×4): qty 50

## 2020-01-28 MED ORDER — VECURONIUM BROMIDE 10 MG IV SOLR: 20.0000 mg | Freq: Once | INTRAVENOUS | Status: AC

## 2020-01-28 MED ORDER — FENTANYL CITRATE (PF) 100 MCG/2ML IJ SOLN: 200.0000 ug | Freq: Once | INTRAMUSCULAR | Status: AC

## 2020-01-28 MED ORDER — SODIUM CHLORIDE 0.9% IV SOLUTION: Freq: Once | INTRAVENOUS | Status: AC

## 2020-01-28 MED ORDER — VECURONIUM BROMIDE 10 MG IV SOLR
INTRAVENOUS | Status: AC
  Administered 2020-01-28: 20 mg via INTRAVENOUS
  Filled 2020-01-28: qty 20

## 2020-01-28 MED ORDER — DEXMEDETOMIDINE HCL IN NACL 400 MCG/100ML IV SOLN: 0.4000 ug/kg/h | INTRAVENOUS | Status: DC

## 2020-01-28 MED ORDER — FENTANYL 2500MCG IN NS 250ML (10MCG/ML) PREMIX INFUSION
0.0000 ug/h | INTRAVENOUS | Status: DC
  Administered 2020-01-28: 400 ug/h via INTRAVENOUS
  Administered 2020-01-28: 100 ug/h via INTRAVENOUS
  Administered 2020-01-29 (×2): 200 ug/h via INTRAVENOUS
  Administered 2020-01-30 (×2): 250 ug/h via INTRAVENOUS
  Administered 2020-01-30: 300 ug/h via INTRAVENOUS
  Administered 2020-01-31: 400 ug/h via INTRAVENOUS
  Administered 2020-01-31: 350 ug/h via INTRAVENOUS
  Administered 2020-01-31: 200 ug/h via INTRAVENOUS
  Administered 2020-02-01 (×2): 300 ug/h via INTRAVENOUS
  Administered 2020-02-01 – 2020-02-02 (×3): 400 ug/h via INTRAVENOUS
  Filled 2020-01-28 (×16): qty 250

## 2020-01-28 NOTE — Progress Notes (Signed)
Okay to place PICC per medical necessity

## 2020-01-28 NOTE — Progress Notes (Signed)
Patient has very large hematoma in R gluteal musculature. He is having significant pain that correlates with location of hematoma.  I recommend IR guided drainage of hematoma when possible. Patient is at high risk for for formal surgical complication at this time.

## 2020-01-28 NOTE — Progress Notes (Signed)
Pharmacy Antibiotic Note  [name] is a 49 y.o. male admitted on 01/23/2020 with symptomatic blood loss anemia and alcohol intoxication. Patient has PMH of alcoholic hepatitis, HLD, and HTN.  overnight patient was tachypneic, tachycardic, and has elevated blood pressure. Tmax 102.9. Patient is going through DTs.  Pharmacy has been consulted for Cefepime dosing for Sepsis.  Plan: Start Cefepime 2g IV every 8 hours   Height: 5\' 7"  (170.2 cm) Weight: 108.9 kg (240 lb) IBW/kg (Calculated) : 66.1  Temp (24hrs), Avg:98.8 F (37.1 C), Min:98.1 F (36.7 C), Max:99.8 F (37.7 C)  Recent Labs  Lab 01/24/20 0403 01/24/20 0403 01/24/20 1012 01/24/20 1012 01/25/20 0326 01/25/20 0445 01/25/20 0742 01/25/20 0750 01/25/20 2205 01/26/20 0448 01/27/20 0352 01/27/20 1046 01/28/20 0445  WBC 9.8  --  10.5  --   --  9.7  --   --   --  10.8* 11.0*  --   --   CREATININE 1.00   < > 0.91   < >  --   --   --  1.08 1.16 1.08 0.71  --  0.55*  LATICACIDVEN  --   --   --   --  1.3  --  1.5  --   --   --   --  1.2  --    < > = values in this interval not displayed.    Estimated Creatinine Clearance: 131.4 mL/min (A) (by C-G formula based on SCr of 0.55 mg/dL (L)).    No Known Allergies  Antimicrobials this admission: 9/2 ceftriaxone  >> 9/6 9/6 Cefepime >>   Microbiology results: 9/2 BCx: NG x 4 days  9/6 MRSA PCR: pending  Thank you for allowing pharmacy to be a part of this patient's care.  11/6, PharmD, BCPS Clinical Pharmacist 01/28/2020 3:33 PM

## 2020-01-28 NOTE — Progress Notes (Signed)
PROGRESS NOTE    Patient: [name]                            PCP: Remi Haggard, FNP                    DOB: [birth year]            DOA: 01/23/2020 EEF:[id number]             DOS: 01/28/2020, 11:35 AM   LOS: 5 days   Date of Service: The patient was seen and examined on 01/28/2020  Subjective:   The patient was seen and examined, much worse mental status noted, confused, agitated, opens his eyes to command. Overnight he has been tachypneic, tachycardic, blood pressure as high as 184/72.. Status post another 2 units of PRBC transfusion yesterday, tolerated well, Hemoglobin proved from 6.9-7.5 this a.m. Worsening T bili--- jaundice  MRI of the hip was obtained by orthopedic, extensive hematoma right hip noted..  Brief Narrative:   [name]  is a 49 y.o. Caucasian male with a known history of polysubstance abuse including alcohol abuse who presented to the emergency room with acute onset of increased generalized weakness and a mechanical fall.  He believes that he lost balance and fell with subsequent right hip pain he usually drinks 5-6 beer at night.    ED: Heart rate was 106 and blood pressure 109/43 with pulse oximetry of 92 and later 96% on room air and otherwise normal vital signs.   Labs revealed hypokalemia of 3.3, anion gap of 15 albumin of 3 total 36.4, AST of 293 and ALT 83 with alk phos of 53 and total bili of 14.2. Ammonia level was 42.   CBC showed no leukocytosis of 11.6 and significant anemia with hemoglobin 6.7 hematocrit 19.1 compared to 10.4 and 29.5 on 01/20/2020 with thrombocytopenia of 123.  INR is 1.5 and PT 17.3.   COVID-19 PCR came back negative.  Alcohol level was 296.Marland Kitchen   Stool Hemoccult was weakly positive.   Right upper quadrant ultrasound revealed hepatic steatosis with findings suggestive of portal hypertension. Right hip CT showed marked left lower soft tissue swelling suspicious for overlying contusive change and/or hematoma and  questionable lucency along the posterolateral aspect of the right femoral head neck junction that could reflect a nondisplaced fracture or some projectional periarticular spurring. There is a suspicious lucency along the junction of the left inferior pubic ramus and pubic body best seen on the lateral radiograph.  The patient was given IV Protonix bolus and infusion, IV octreotide bolus and infusion, 1 g of IV Rocephin and IV Mucomyst   Assessment & Plan:   Principal Problem:   Sepsis ([place]) Active Problems:   Acute blood loss anemia   GIB (gastrointestinal bleeding)   Transaminitis   Hyponatremia   Hypokalemia   Falls   Hip pain   Rhabdomyolysis   Hypovolemia   Hypotension  Altered mental status (toxic metabolic encephalopathy -hepatic encephalopathy) -As patient is going through DTs, worsening mental status noted today -Hemodynamically stable -Patient was on Precedex in ED, patient was tapered off was transferred to the floor on the CIWA protocol -Aggressive, agitated, confused  Patient will be transferred to ICU, PCCM consulted, anticipating restarting Precedex, if progressed to hemodynamically stable he may need to be intubated   Acute symptomatic blood loss anemia secondary to GI bleeding. -H&H stable dropping, per consultants, trying to keep hemoglobin  greater than 7, this morning 6.9 again -01/27/2020 another 2 units of PRBC transfusion will be pursued  -Thus far he has received total of 4 units of PRBC blood transfusion since admission, 9 /2. 9/3, 9/5 -Hemoglobin 6.4 >>  5.9  >>> 7.8 >> 6.9 >>7.5 today  -No active signs of bleeding likely due to extensive hematoma throughout.. More notable hip area  -Discussed with GI and hematology -the benefit outweighs the risks with blood transfusion Therefore more pursue with blood transfusion this morning    -Hemoccult positive, no active signs of bleeding Hemoglobin on admission 6.7, 6.6,>>> 2 U PRBC >> 6.4 >>> 5.9   >>>7.8  ...6.9 >>7.5  today -We will follow posttransfusion H&H. -We will continue him on IV Protonix drip and octreotide drip. -GI consulted -withholding EGD due to hypotension, severe anemia, hypocalcemia, fever -GI following --- appreciate and close follow-up and recommendations  SIRS/sepsis fever of unknown origin -meets the criteria for T-max 102.9, tachypneic 27 hypotensive 90/57 (But this is complicated by severe anemia, hypovolemia, rhabdomyolysis -due to fall, alcohol intoxication) -This morning still tachypneic, tachycardic, but hypotensive with a blood pressure 183/83  -On 321 T-max of 102.9, WBC of 9.9, lactic acid 1.3, 1.5, procalcitonin 0.13, -Blood cultures >>> NTD -Patient has been on Rocephin, will be switched to cefepime   Rhabdomyelitis -Monitoring total CK level, 2165 >>> 1507 >>932  today -Continue IV fluids >>> switch to lactic ringer 150 ml/ hour   Alcoholic hepatitis with early hepatic cell failure and current alcohol intoxication /jaundice -Initiating alcohol detox protocol CIWA -Remain encephalopathic, was on Precedex in ED, currently on CIWA, Will transfer to ICU, restarting Precedex -Monitoring LFTs >>> improving AST ALT alk phos still elevated T bili  -Patient discriminant liver function is> 32, 39 concerning steroid initiation due to alcohol hepatitis but due to fever without source of infection at this time will withhold steroids as this risks outweighs the benefit.   Hypokalemia/hypocalcemia hypomagnesemia -Once again repleted calcium gluconate 2 g 9/ 4, 9 /5 -Monitoring and repleting accordingly    Hyponatremia. -Likely due to beer azotemia, monitoring closely -Stable  Large right hip hematoma Fall with subsequent multiple bruises / contusions /hematoma/ruled out the possibility of  Rt femoral head neck fracture, inferior pubic ramus fracture  -MRI of the hip 01/27/2020 reviewed extensive hematoma of the right hip extending to thigh area -Orthopedic  team Dr. Posey Pronto following recommended IR to aspirate hematoma -we believe patient is not stable this time for hematoma extraction  -Progressively worsening worsening right hip tenderness edema with ecchymosis -hip CT scan >> revealing extensive hematoma negative for any acute fractures -Notified Dr. Roland Rack about the patient..  Dyslipidemia. -Statin therapy will be held off given acute hepatitis.    Essential hypertension. -We will continue Toprol-XL.   DVT prophylaxis.-SCDs. -Medical prophylaxis currently contraindicated due to his associated alcohol-related chronic liver disease and subsequent coagulopathy... Also extensive hematoma CODE STATUS: Full code Family communication -no family member present at bedside-discussed with the patient  Status is: Inpatient  Remains inpatient appropriate because:Hemodynamically unstable, Persistent severe electrolyte disturbances, Ongoing diagnostic testing needed not appropriate for outpatient work up, Unsafe d/c plan, IV treatments appropriate due to intensity of illness or inability to take PO and Inpatient level of care appropriate due to severity of illness   Dispo: The patient is from: Home  Anticipated d/c is to: Home  Anticipated d/c date is: >3 days  Patient currently is not medically stable to d/c.... Progressively getting worse, will be transferred to  ICU IV fluids, IV Precedex, likely intervention once more stable by orthopedic team and GI      Consultants: GI -Dr. Dustin Folks.  Dr. Posey Pronto   Procedures:   No admission procedures for hospital encounter.   Antimicrobials:  Anti-infectives (From admission, onward)   Start     Dose/Rate Route Frequency Ordered Stop   01/24/20 1600  cefTRIAXone (ROCEPHIN) 2 g in sodium chloride 0.9 % 100 mL IVPB        2 g 200 mL/hr over 30 Minutes Intravenous Every 24 hours 01/24/20 1520     01/23/20 2045  cefTRIAXone (ROCEPHIN) 1 g in sodium  chloride 0.9 % 100 mL IVPB        1 g 200 mL/hr over 30 Minutes Intravenous  Once 01/23/20 2042 01/23/20 2216       Medication:  . sodium chloride   Intravenous Once  . sodium chloride   Intravenous Once  . sodium chloride   Intravenous Once  . sodium chloride   Intravenous Once  . folic acid  1 mg Oral Daily  . furosemide  20 mg Intravenous Once  . metoprolol succinate  25 mg Oral Daily  . multivitamin with minerals  1 tablet Oral Daily  . QUEtiapine  25 mg Oral QHS  . thiamine  100 mg Oral Daily   Or  . thiamine  100 mg Intravenous Daily    sodium chloride, acetaminophen **OR** acetaminophen, LORazepam, LORazepam **OR** LORazepam, morphine injection, ondansetron **OR** ondansetron (ZOFRAN) IV, traZODone   Objective:   Vitals:   01/27/20 2046 01/27/20 2316 01/28/20 0544 01/28/20 0752  BP: 110/87 (!) 156/52 125/68 132/71  Pulse: (!) 106 (!) 110 (!) 104 98  Resp:  (!) _0 Temp: 99.2 F (37.3 C) 99.8 F (37.7 C) 99.3 F (37.4 C) 98.3 F (36.8 C)  TempSrc: Oral Oral Oral Oral  SpO2: 92% 99% 95% 96%  Weight:      Height:        Intake/Output Summary (Last 24 hours) at 01/28/2020 1135 Last data filed at 01/28/2020 0820 Gross per 24 hour  Intake 480 ml  Output 400 ml  Net 80 ml   Filed Weights   01/24/20 1058  Weight: 108.9 kg     Examination:       Physical Exam:   General:   Altered mental status--- opens his eyes to command, confused  HEENT:  Normocephalic, PERRL, otherwise with in Normal limits   Neuro:  CNII-XII intact. , normal motor and sensation, reflexes intact   Lungs:   Clear to auscultation BL, Respirations unlabored, no wheezes / crackles  Cardio:    S1/S2, RRR, No murmure, No Rubs or Gallops   Abdomen:   Soft, non-tender, bowel sounds active all four quadrants,  no guarding or peritoneal signs.  Muscular skeletal:   Right hip pain, extensive diffuse hematoma right hip also on her neck, lower extremities Limited exam - in bed, able to  move all 4 extremities, Normal strength,  2+ pulses,  symmetric, No pitting edema  Skin:  Dry, warm to touch, negative for any Rashes, No open wounds  Wounds: Please see nursing documentation  +++jaundice, extensive bruises, hematoma on lower extremities, facial, large neck hematoma ... Otherwise dry, warm to touch, negative for any Rashes,      ------------------------------------------------------------------------------------------------------------------------------------------    LABs:  CBC Latest Ref Rng & Units 01/28/2020 01/28/2020 01/27/2020  WBC 4.0 - 10.5 K/uL - - -  Hemoglobin 13.0 - 17.0  g/dL 7.5(L) 7.5(L) 8.3(L)  Hematocrit 39 - 52 % 23.0(L) 21.9(L) 23.8(L)  Platelets 150 - 400 K/uL - - -   CMP Latest Ref Rng & Units 01/28/2020 01/27/2020 01/26/2020  Glucose 70 - 99 mg/dL 112(H) 114(H) 120(H)  BUN 6 - 20 mg/dL 31(H) 37(H) 31(H)  Creatinine 0.61 - 1.24 mg/dL 0.55(L) 0.71 1.08  Sodium 135 - 145 mmol/L 140 136 134(L)  Potassium 3.5 - 5.1 mmol/L 4.1 4.6 4.1  Chloride 98 - 111 mmol/L 103 102 103  CO2 22 - 32 mmol/L _0 Calcium 8.9 - 10.3 mg/dL 7.8(L) 6.9(L) 6.7(L)  Total Protein 6.5 - 8.1 g/dL 5.9(L) 5.6(L) 5.6(L)  Total Bilirubin 0.3 - 1.2 mg/dL 19.7(HH) 16.1(H) 16.1(H)  Alkaline Phos 38 - 126 U/L 39 38 37(L)  AST 15 - 41 U/L 158(H) 164(H) 190(H)  ALT 0 - 44 U/L 58(H) 55(H) 58(H)       Micro Results Recent Results (from the past 240 hour(s))  SARS Coronavirus 2 by RT PCR (hospital order, performed in [facility] hospital lab) Nasopharyngeal Nasopharyngeal Swab     Status: None   Collection Time: 01/23/20  7:35 PM   Specimen: Nasopharyngeal Swab  Result Value Ref Range Status   SARS Coronavirus 2 NEGATIVE NEGATIVE Final    Comment: (NOTE) SARS-CoV-2 target nucleic acids are NOT DETECTED.  The SARS-CoV-2 RNA is generally detectable in upper and lower respiratory specimens during the acute phase of infection. The lowest concentration of SARS-CoV-2 viral copies this  assay can detect is 250 copies / mL. A negative result does not preclude SARS-CoV-2 infection and should not be used as the sole basis for treatment or other patient management decisions.  A negative result may occur with improper specimen collection / handling, submission of specimen other than nasopharyngeal swab, presence of viral mutation(s) within the areas targeted by this assay, and inadequate number of viral copies (<250 copies / mL). A negative result must be combined with clinical observations, patient history, and epidemiological information.  Fact Sheet for Patients:   StrictlyIdeas.no  Fact Sheet for Healthcare Providers: BankingDealers.co.za  This test is not yet approved or  cleared by the [place] FDA and has been authorized for detection and/or diagnosis of SARS-CoV-2 by FDA under an Emergency Use Authorization (EUA).  This EUA will remain in effect (meaning this test can be used) for the duration of the COVID-19 declaration under Section 564(b)(1) of the Act, 21 U.S.C. section 360bbb-3(b)(1), unless the authorization is terminated or revoked sooner.  Performed at [facility], [address] [place], [place] [address]   CULTURE, BLOOD (ROUTINE X 2) w Reflex to ID Panel     Status: None (Preliminary result)   Collection Time: 01/24/20  4:15 PM   Specimen: BLOOD  Result Value Ref Range Status   Specimen Description BLOOD RIGHT ANTECUBITAL  Final   Special Requests   Final    BOTTLES DRAWN AEROBIC AND ANAEROBIC Blood Culture adequate volume   Culture   Final    NO GROWTH 4 DAYS Performed at [facility], [address], [place], [place] [address]    Report Status PENDING  Incomplete  CULTURE, BLOOD (ROUTINE X 2) w Reflex to ID Panel     Status: None (Preliminary result)   Collection Time: 01/24/20  4:15 PM   Specimen: BLOOD  Result Value Ref Range Status   Specimen Description BLOOD LEFT  ANTECUBITAL  Final   Special Requests   Final  BOTTLES DRAWN AEROBIC AND ANAEROBIC Blood Culture adequate volume   Culture   Final    NO GROWTH 4 DAYS Performed at [facility], [address] [place], [place] [address]    Report Status PENDING  Incomplete    Radiology Reports CT PELVIS WO CONTRAST  Result Date: 01/24/2020 CLINICAL DATA:  Pain, question fracture EXAM: CT PELVIS WITHOUT CONTRAST TECHNIQUE: Multidetector CT imaging of the pelvis was performed following the standard protocol without intravenous contrast. COMPARISON:  None. FINDINGS: Urinary Tract: The visualized distal ureters and bladder appear unremarkable. Bowel: No bowel wall thickening, distention or surrounding inflammation identified within the pelvis. Vascular/Lymphatic: No enlarged pelvic lymph nodes identified. No significant vascular findings. Reproductive: The prostate is unremarkable. Other: Bilateral subcutaneous edema seen around the hips. Musculoskeletal: No fracture or dislocation. 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 There is a large heterogeneous hyperdense areas seen throughout the right gluteal musculature with expansion most notable within the gluteus medius muscle belly, likely intramuscular hematoma. There is also a small subcutaneous hyperdense collections seen overlying the left gluteal musculature, likely superficial hematomas. IMPRESSION: No acute osseous abnormality. Large intramuscular hematoma within the right gluteal musculature. Overlying subcutaneous edema and skin thickening. superficial subcutaneous small hematoma seen overlying the left gluteal musculature. Electronically Signed   By: Prudencio Pair M.D.   On: 01/24/2020 02:45   MR HIP RIGHT WO CONTRAST  Result Date: 01/27/2020 CLINICAL DATA:  Right gluteal hematoma, for further characterization. EXAM: MR OF THE RIGHT HIP WITHOUT CONTRAST TECHNIQUE: Multiplanar,  multisequence MR imaging was performed. No intravenous contrast was administered. COMPARISON:  CT pelvis 01/24/2020 FINDINGS: The patient terminated the exam following the completion of coronal T1 and T2 weighted images. These images are adversely affected by motion artifact. The patient did not allow completion of the exam, and due to these factors, diagnostic sensitivity and specificity of the examination is reduced. Bones: Accentuated T2 signal and reduced T1 signal in the pubic bodies bilaterally suspicious for osteitis pubis. No surrounding fluid collection to suggest infection. SI joints grossly unremarkable. No observed fracture. Articular cartilage and labrum Articular cartilage: Difficult to assess given the lack of small field-of-view images and the motion artifact. Labrum: Today's exam is not diagnostic for assessment of the labrum. Joint or bursal effusion Joint effusion:  Absent Bursae: No bursitis. Muscles and tendons Muscles and tendons: Heterogeneous hematoma primarily in the right gluteus medius muscle, but also tracking up through the sciatic notch along the right piriformis muscle, measuring approximately 22.1 by 13.5 by 8.5 cm (volume = 1300 cm^3). By my estimates this is similar in volume to the prior CT from 01/24/2020, indicating no significant change over the 12 hours between the 2 studies. The hematoma appears to moderately impinge on the right sciatic nerve at and distal to the sciatic notch. Low-level asymmetric edema in the right gluteus maximus muscle. Trace edema along the right hip adductor musculature. Other findings Miscellaneous: We partially include apparent hematoma in the subcutaneous tissues lateral to the left gluteus musculature, better shown on the CT examination. There is subcutaneous edema along both buttock and flank regions. Mild edema in the retroperitoneum, presacral region, and along the right pelvic sidewall. IMPRESSION: 1. Heterogeneous hematoma primarily in the  right gluteus medius muscle, but also tracking up through the sciatic notch along the right piriformis muscle. Similar volume to prior CT about 1300 cubic cm. The hematoma appears to moderately impinge on the right sciatic nerve at and  distal to the sciatic notch. 2. Low-level edema in the right gluteus maximus and right hip adductor musculature. 3. Subcutaneous edema along both buttock and flank regions. 4. 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 5. Subcutaneous hematoma lateral to the left gluteal musculature, better shown on the prior CT. 6. Mild edema in the retroperitoneum, presacral region, and along the right pelvic sidewall. 7. The patient terminated the exam following the completion of the exam, and due to motion artifact. The patient did not allow completion of the exam, and due to these factors, diagnostic sensitivity and specificity of the examination is reduced. Electronically Signed   By: Van Clines M.D.   On: 01/27/2020 15:44   DG Hip Unilat W or Wo Pelvis 2-3 Views Right  Result Date: 01/23/2020 CLINICAL DATA:  Right hip pain post fall EXAM: DG HIP (WITH OR WITHOUT PELVIS) 2-3V RIGHT COMPARISON:  None. FINDINGS: There is marked lateral soft tissue swelling of the right hip. Suspicious for overlying contusive change and/or hematoma. There is a questionable lucency along the posterolateral aspect of the right femoral head neck junction which could reflect a nondisplaced fracture or some projectional periarticular spurring. Additional suspicious lucency is seen along the junction of the left inferior pubic ramus and pubic body best seen on the lateral hip radiograph. Could correlate for point tenderness in this location. There is a background of diffuse degenerative change in the lower spine, hips and pelvis. Enthesopathic changes noted as well at the iliac crest, ischial tuberosities and lesser trochanters bilaterally. IMPRESSION: 1. Marked lateral soft tissue swelling of the right hip. Suspicious for  overlying contusive change and/or hematoma. 2. Questionable lucency along the posterolateral aspect of the right femoral head neck junction which could reflect a nondisplaced fracture or some projectional periarticular spurring. 3. Additional suspicious lucency along the junction of the left inferior pubic ramus and pubic body best seen on the lateral radiograph. 4. Given these latter findings, further evaluation with a bony pelvis CT could be obtained for more definitive assessment. Electronically Signed   By: Lovena Le M.D.   On: 01/23/2020 21:55   US Abdomen Limited RUQ  Result Date: 01/23/2020 CLINICAL DATA:  Elevated liver enzymes EXAM: ULTRASOUND ABDOMEN LIMITED RIGHT UPPER QUADRANT COMPARISON:  None. FINDINGS: Gallbladder: No gallstones or wall thickening visualized. No sonographic Murphy sign noted by sonographer. Common bile duct: Diameter: 5.9 mm Liver: Increased echogenicity seen throughout the liver parenchyma. No focal hepatic lesion. No biliary ductal dilatation. The portal vein is patent, with reversal of flow however and recanalized umbilical vein. Other: None. IMPRESSION: Hepatic steatosis Reversal of flow in the portal vein with findings suggestive of portal hypertension Electronically Signed   By: Prudencio Pair M.D.   On: 01/23/2020 21:21    Total of 79 minutes of critical time was spent in evaluating reviewing labs discussing plan of care with nursing staff, consultants--- drawing including treatment for today. Stabilizing the patient with blood transfusion IV fluids.Marland Kitchen   SIGNED: Deatra James, MD, FACP, FHM. [facility],  Pager (please use [email or web address] to page/text)  If 7PM-7AM, please contact night-coverage [email or web address] Ota [facility] 01/28/2020, 11:35 AM

## 2020-01-28 NOTE — Progress Notes (Signed)
Sonny Dandy RN aware PICC is ready for use.  Also notified to change all IV tubings to connect to PICC and d/c all PIV to reduce risk of CLABSI.

## 2020-01-28 NOTE — Procedures (Signed)
Endotracheal Intubation: Patient required placement of an artificial airway secondary to Respiratory Failure  Consent: Emergent.   Hand washing performed prior to starting the procedure.   Medications administered for sedation prior to procedure:  Midazolam 4 mg IV,  Vecuronium 10 mg IV, Fentanyl 100 mcg IV.    A time out procedure was called and correct patient, name, & ID confirmed. Needed supplies and equipment were assembled and checked to include ETT, 10 ml syringe, Glidescope, Mac and Miller blades, suction, oxygen and bag mask valve, end tidal CO2 monitor.   Patient was positioned to align the mouth and pharynx to facilitate visualization of the glottis.   Heart rate, SpO2 and blood pressure was continuously monitored during the procedure. Pre-oxygenation was conducted prior to intubation and endotracheal tube was placed through the vocal cords into the trachea.     The artificial airway was placed under direct visualization via glidescope route using a 8.0 ETT on the first attempt.  ETT was secured at 23 cm mark.  Placement was confirmed by auscuitation of lungs with good breath sounds bilaterally and no stomach sounds.  Condensation was noted on endotracheal tube.   Pulse ox 98%.  CO2 detector in place with appropriate color change.   Complications: None .   Operator: Bensen Chadderdon.   Chest radiograph ordered and pending.   Comments: OGT placed via glidescope.  Kyria Bumgardner David Penny Arrambide, M.D.  [facility]  Medical Director ICU-[facility]  Medical Director [facility] Cardio-Pulmonary Department       

## 2020-01-28 NOTE — Progress Notes (Signed)
Peripherally Inserted Central Catheter Placement  The IV Nurse has discussed with the patient and/or persons authorized to consent for the patient, the purpose of this procedure and the potential benefits and risks involved with this procedure.  The benefits include less needle sticks, lab draws from the catheter, and the patient may be discharged home with the catheter. Risks include, but not limited to, infection, bleeding, blood clot (thrombus formation), and puncture of an artery; nerve damage and irregular heartbeat and possibility to perform a PICC exchange if needed/ordered by physician.  Alternatives to this procedure were also discussed.  Bard Power PICC patient education guide, fact sheet on infection prevention and patient information card has been provided to patient /or left at bedside.    PICC Placement Documentation  PICC Triple Lumen 01/28/20 PICC Right Cephalic 39 cm 0 cm (Active)  Indication for Insertion or Continuance of Line Vasoactive infusions 01/28/20 1817  Exposed Catheter (cm) 0 cm 01/28/20 1817  Site Assessment Clean;Dry;Intact 01/28/20 1817  Lumen #1 Status Flushed;Saline locked;Blood return noted 01/28/20 1817  Lumen #2 Status Flushed;Saline locked;Blood return noted 01/28/20 1817  Lumen #3 Status Flushed;Saline locked;Blood return noted 01/28/20 1817  Dressing Type Transparent 01/28/20 1817  Dressing Status Clean;Dry;Intact;Antimicrobial disc in place 01/28/20 1817  Safety Lock Not Applicable 01/28/20 1817  Line Care Connections checked and tightened 01/28/20 1817  Line Adjustment (NICU/IV Team Only) No 01/28/20 1817  Dressing Intervention New dressing 01/28/20 1817  Dressing Change Due 02/04/20 01/28/20 1817   NO family available for PICC consent. Pt sedated. Medical necessity obtained from Dr Army Chaco.    [name] 01/28/2020, 6:17 PM

## 2020-01-28 NOTE — Progress Notes (Signed)
Patient had a restless night, pulled out 3 IV lines, couldn't keep external caths on, atleast 5 whole bed linen change done. Keeps wanting to get up, but very weak, mittens on. On CIWA, will continue to monitor.

## 2020-01-28 NOTE — Consult Note (Addendum)
Name: [name] MRN: [id number] DOB: 12-08-1970     CONSULTATION DATE: 01/23/2020  REFERRING MD :  Burman Freestone  CHIEF COMPLAINT: agitation   HISTORY OF PRESENT ILLNESS:   74 malewith a known history of polysubstance abuse including alcohol abuse who presented to the emergency room with acute onset of increased generalized weakness and amechanicalfall.  +ETOH ABUSE lost balance and fell with subsequent right hip pain he usually drinks 5-6 beer at night.   ED COURSE: Heart rate was 106 and blood pressure 109/43 with pulse oximetry of 92 and later 96% on room air  Labs revealed hypokalemia of 3.3, anion gap of 15 albumin of 3 total 36.4, AST of 293 and ALT 83 with alk phos of 53 and total bili of 14.2. Ammonia level was 42.  CBC showed no leukocytosis of 11.6 and significant anemia with hemoglobin 6.7 hematocrit 19.1 compared to 10.4 and 29.5 on 01/20/2020 with thrombocytopenia of 123. INR is 1.5 and PT 17.3.  COVID-19 PCR negative.  Alcohol level was 296.Marland Kitchen  Stool Hemoccult was weakly positive.  Right upper quadrant ultrasound-steatosis with findings suggestive of portal hypertension.  RT HIP CT showed marked left lower soft tissue swelling suspicious for overlying contusive change and/or hematoma and questionable lucency along the posterolateral aspect of the right femoral head neck junction that could reflect a nondisplaced fracture or some projectional periarticular spurring. There is a suspicious lucency along the junction of the left inferior pubic ramus and pubic body best seen on the lateral radiograph. ORTHO WAS CONSULTED   DX of LIVER FAILURE GI CONSULTED  9/6 transferred to ICU for increased WOB, using accessory muscles to breathe with liver failure   PAST MEDICAL HISTORY :   has a past medical history of Polysubstance abuse ([place]).  has no past surgical history on file. Prior to Admission medications   Medication Sig Start Date End Date Taking?  Authorizing Provider  atorvastatin (LIPITOR) 20 MG tablet Take 20 mg by mouth at bedtime. 11/08/19  Yes [provider]  doxycycline (VIBRAMYCIN) 100 MG capsule Take 100 mg by mouth 2 (two) times daily. 01/10/20  Yes [provider]  metoprolol succinate (TOPROL-XL) 25 MG 24 hr tablet Take 25 mg by mouth daily. 01/11/20  Yes [provider]   No Known Allergies  FAMILY HISTORY:  family history is not on file. SOCIAL HISTORY:  reports that he has never smoked. He has never used smokeless tobacco. He reports current alcohol use. He reports previous drug use.  REVIEW OF SYSTEMS:   Unable to obtain due to critical illness      Estimated body mass index is 37.59 kg/m as calculated from the following:   Height as of this encounter: $RemoveBeforeD'5\' 7"'[id number]$  (1.702 m).   Weight as of this encounter: 108.9 kg.    VITAL SIGNS: Temp:  [98.1 F (36.7 C)-99.8 F (37.7 C)] 98.1 F (36.7 C) (09/06 1229) Pulse Rate:  [96-110] 97 (09/06 1331) Resp:  [18-28] 18 (09/06 1229) BP: (110-186)/(52-105) 160/82 (09/06 1331) SpO2:  [90 %-99 %] 96 % (09/06 1229)   I/O last 3 completed shifts: In: 600 [P.O.:120; Blood:380; IV Piggyback:100] Out: 1450 [Urine:1450] Total I/O In: -  Out: 200 [Urine:200]   SpO2: 96 % O2 Flow Rate (L/min): 3 L/min FiO2 (%): 21 %   Physical Examination:  GENERAL:critically ill appearing, +resp distress icteric sclera obese HEAD: Normocephalic, atraumatic.  EYES: Pupils equal, round, reactive to light.  No scleral icterus.  MOUTH: Moist mucosal membrane. NECK:  Supple. No JVD. Left Neck Hematoma PULMONARY: +rhonchi, +wheezing CARDIOVASCULAR: S1 and S2. Regular rate and rhythm. No murmurs, rubs, or gallops.  GASTROINTESTINAL: Soft, nontender, -distended.  Positive bowel sounds.  MUSCULOSKELETAL: No swelling, clubbing, or edema.  Thigh hemtomas NEUROLOGIC: obtunded SKIN:intact,warm,dry +jaundice   MEDICATIONS: I have reviewed all medications and  confirmed regimen as documented   CULTURE RESULTS   Recent Results (from the past 240 hour(s))  SARS Coronavirus 2 by RT PCR (hospital order, performed in [facility] hospital lab) Nasopharyngeal Nasopharyngeal Swab     Status: None   Collection Time: 01/23/20  7:35 PM   Specimen: Nasopharyngeal Swab  Result Value Ref Range Status   SARS Coronavirus 2 NEGATIVE NEGATIVE Final    Comment: (NOTE) SARS-CoV-2 target nucleic acids are NOT DETECTED.  The SARS-CoV-2 RNA is generally detectable in upper and lower respiratory specimens during the acute phase of infection. The lowest concentration of SARS-CoV-2 viral copies this assay can detect is 250 copies / mL. A negative result does not preclude SARS-CoV-2 infection and should not be used as the sole basis for treatment or other patient management decisions.  A negative result may occur with improper specimen collection / handling, submission of specimen other than nasopharyngeal swab, presence of viral mutation(s) within the areas targeted by this assay, and inadequate number of viral copies (<250 copies / mL). A negative result must be combined with clinical observations, patient history, and epidemiological information.  Fact Sheet for Patients:   StrictlyIdeas.no  Fact Sheet for Healthcare Providers: BankingDealers.co.za  This test is not yet approved or  cleared by the [place] FDA and has been authorized for detection and/or diagnosis of SARS-CoV-2 by FDA under an Emergency Use Authorization (EUA).  This EUA will remain in effect (meaning this test can be used) for the duration of the COVID-19 declaration under Section 564(b)(1) of the Act, 21 U.S.C. section 360bbb-3(b)(1), unless the authorization is terminated or revoked sooner.  Performed at [facility], [address] [place], [place] [address]   CULTURE, BLOOD (ROUTINE X 2) w Reflex to ID Panel     Status:  None (Preliminary result)   Collection Time: 01/24/20  4:15 PM   Specimen: BLOOD  Result Value Ref Range Status   Specimen Description BLOOD RIGHT ANTECUBITAL  Final   Special Requests   Final    BOTTLES DRAWN AEROBIC AND ANAEROBIC Blood Culture adequate volume   Culture   Final    NO GROWTH 4 DAYS Performed at [facility], [address] [place], [place] [address]    Report Status PENDING  Incomplete  CULTURE, BLOOD (ROUTINE X 2) w Reflex to ID Panel     Status: None (Preliminary result)   Collection Time: 01/24/20  4:15 PM   Specimen: BLOOD  Result Value Ref Range Status   Specimen Description BLOOD LEFT ANTECUBITAL  Final   Special Requests   Final    BOTTLES DRAWN AEROBIC AND ANAEROBIC Blood Culture adequate volume   Culture   Final    NO GROWTH 4 DAYS Performed at [facility], [address] [place], [place] [address]    Report Status PENDING  Incomplete          IMAGING    MR HIP RIGHT WO CONTRAST  Result Date: 01/27/2020 CLINICAL DATA:  Right gluteal hematoma, for further characterization. EXAM: MR OF THE RIGHT HIP WITHOUT CONTRAST TECHNIQUE: Multiplanar, multisequence MR imaging was performed. No intravenous contrast was administered. COMPARISON:  CT pelvis  01/24/2020 FINDINGS: The patient terminated the exam following the completion of coronal T1 and T2 weighted images. These images are adversely affected by motion artifact. The patient did not allow completion of the exam, and due to these factors, diagnostic sensitivity and specificity of the examination is reduced. Bones: Accentuated T2 signal and reduced T1 signal in the pubic bodies bilaterally suspicious for osteitis pubis. No surrounding fluid collection to suggest infection. SI joints grossly unremarkable. No observed fracture. Articular cartilage and labrum Articular cartilage: Difficult to assess given the lack of small field-of-view images and the motion artifact. Labrum: Today's exam is  not diagnostic for assessment of the labrum. Joint or bursal effusion Joint effusion:  Absent Bursae: No bursitis. Muscles and tendons Muscles and tendons: Heterogeneous hematoma primarily in the right gluteus medius muscle, but also tracking up through the sciatic notch along the right piriformis muscle, measuring approximately 22.1 by 13.5 by 8.5 cm (volume = 1300 cm^3). By my estimates this is similar in volume to the prior CT from 01/24/2020, indicating no significant change over the 12 hours between the 2 studies. The hematoma appears to moderately impinge on the right sciatic nerve at and distal to the sciatic notch. Low-level asymmetric edema in the right gluteus maximus muscle. Trace edema along the right hip adductor musculature. Other findings Miscellaneous: We partially include apparent hematoma in the subcutaneous tissues lateral to the left gluteus musculature, better shown on the CT examination. There is subcutaneous edema along both buttock and flank regions. Mild edema in the retroperitoneum, presacral region, and along the right pelvic sidewall. IMPRESSION: 1. Heterogeneous hematoma primarily in the right gluteus medius muscle, but also tracking up through the sciatic notch along the right piriformis muscle. Similar volume to prior CT about 1300 cubic cm. The hematoma appears to moderately impinge on the right sciatic nerve at and distal to the sciatic notch. 2. Low-level edema in the right gluteus maximus and right hip adductor musculature. 3. Subcutaneous edema along both buttock and flank regions. 4. 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 5. Subcutaneous hematoma lateral to the left gluteal musculature, better shown on the prior CT. 6. Mild edema in the retroperitoneum, presacral region, and along the right pelvic sidewall. 7. The patient terminated the exam following the completion of the exam, and due to motion artifact. The patient did not allow completion of the exam, and due to these factors, diagnostic  sensitivity and specificity of the examination is reduced. Electronically Signed   By: Van Clines M.D.   On: 01/27/2020 15:44     Nutrition Status:        ASSESSMENT AND PLAN SYNOPSIS  Severe acute toxic metabolic encephalopathy with acute ETOH intoxication with acute liver failure complicated by rhabdomyolysis with  neck and thigh hematoma leading to intubation  Severe ACUTE Hypoxic and Hypercapnic Respiratory Failure With inability to protect airway with high risk for aspiration Plan for urgent intubation  ACUTE LIVER  INJURY/LIVER Failure Follow up Gi recs    NEUROLOGY Acute toxic metabolic encephalopathy  CARDIAC ICU monitoring  ID -continue IV abx as prescibed -follow up cultures  GI GI PROPHYLAXIS as indicated  NUTRITIONAL STATUS DIET-->TF's as tolerated Constipation protocol as indicated   ENDO - will use ICU hypoglycemic\Hyperglycemia protocol if needed    ELECTROLYTES -follow labs as needed -replace as needed -pharmacy consultation and following    DVT/GI PRX ordered and assessed TRANSFUSIONS AS NEEDED MONITOR FSBS I Assessed the need for Labs I Assessed the need for Foley I Assessed the need for  Central Venous Line Family Discussion when available I Assessed the need for Mobilization I made an Assessment of medications to be adjusted accordingly Safety Risk assessment Completed  CASE DISCUSSED IN MULTIDISCIPLINARY ROUNDS WITH ICU TEAM   Critical Care Time devoted to patient care services described in this note is 78 minutes.   Overall, patient is critically ill, prognosis is guarded.  Patient with Multiorgan failure and at high risk for cardiac arrest and death.   Prognosis poor, anticipate prolonged ICU LOS I attempted to call Step father multiple times  Corrin Parker, M.D.  [facility]  Medical Director [place] Director [facility] Cardio-Pulmonary Department

## 2020-01-28 NOTE — Progress Notes (Signed)
Pt transfer to ICU 9. Report given to Gaffer.

## 2020-01-28 NOTE — Progress Notes (Signed)
GI Inpatient Follow-up Note  Subjective:  Patient seen in follow-up for Dr. Servando Snare over the weekend for alcoholic hepatitis and anemia 2/2 hematomas. Patient had a very restless night with at least 5 whole bed linen changes and pulling out 3 IV lines. He had MRI of right hip yesterday showing worsening hematoma down right lower extremity. Dr. Allena Katz in orthopedics is advising IR guided drainage of hematoma. Hemoglobin 7.5 this morning. No hematochezia, melena, or hematemesis. Tmax 99.8 last night 11 PM. Total bilirubin has increased to 19.7 this morning. Patient is much more confused today and not able to contribute any historical information.  Scheduled Inpatient Medications:  . sodium chloride   Intravenous Once  . sodium chloride   Intravenous Once  . sodium chloride   Intravenous Once  . sodium chloride   Intravenous Once  . folic acid  1 mg Oral Daily  . furosemide  20 mg Intravenous Once  . metoprolol succinate  25 mg Oral Daily  . multivitamin with minerals  1 tablet Oral Daily  . QUEtiapine  25 mg Oral QHS  . thiamine  100 mg Oral Daily   Or  . thiamine  100 mg Intravenous Daily    Continuous Inpatient Infusions:   . sodium chloride 250 mL (01/27/20 1743)  . cefTRIAXone (ROCEPHIN)  IV 2 g (01/27/20 1744)  . dexmedetomidine (PRECEDEX) IV infusion    . lactated ringers 150 mL/hr at 01/28/20 1013  . octreotide  (SANDOSTATIN)    IV infusion Stopped (01/26/20 0541)    PRN Inpatient Medications:  sodium chloride, acetaminophen **OR** acetaminophen, LORazepam, LORazepam **OR** LORazepam, morphine injection, ondansetron **OR** ondansetron (ZOFRAN) IV, traZODone  Review of Systems:  Unable to obtain due to patient's status.    Physical Examination: BP 132/71 (BP Location: Right Arm)   Pulse 98   Temp 98.3 F (36.8 C) (Oral)   Resp 18   Ht 5\' 7"  (1.702 m)   Wt 108.9 kg   SpO2 96%   BMI 37.59 kg/m    Gen: moderate distress, appears uncomfortable. Confused, agitated. Speech  is garbled.  HEENT: PEERLA, EOMI,+sclera icteric Neck: supple, no JVD or thyromegaly Chest: CTA bilaterally, no wheezes, crackles, or other adventitious sounds CV: RRR, no m/g/c/r Abd: soft, NT, ND, +BS in all four quadrants; no HSM, guarding, ridigity, or rebound tenderness Ext: no edema, well perfused with 2+ pulses, Skin:Multiple areas of large bruising on his lower extremities. Lymph: no LAD   Data: Lab Results  Component Value Date   WBC 11.0 (H) 01/27/2020   HGB 7.5 (L) 01/28/2020   HCT 23.0 (L) 01/28/2020   MCV 102.9 (H) 01/27/2020   PLT 97 (L) 01/27/2020   Recent Labs  Lab 01/27/20 2142 01/28/20 0445 01/28/20 1120  HGB 8.3* 7.5* 7.5*   Lab Results  Component Value Date   NA 140 01/28/2020   K 4.1 01/28/2020   CL 103 01/28/2020   CO2 27 01/28/2020   BUN 31 (H) 01/28/2020   CREATININE 0.55 (L) 01/28/2020   Lab Results  Component Value Date   ALT 58 (H) 01/28/2020   AST 158 (H) 01/28/2020   ALKPHOS 39 01/28/2020   BILITOT 19.7 (HH) 01/28/2020   Recent Labs  Lab 01/24/20 0403 01/25/20 0759 01/28/20 1120  APTT 36  --   --   INR 1.6*   < > 1.6*   < > = values in this interval not displayed.   Assessment/Plan:  49 y/o Caucasian male with a PMH of polysubstance abuse  including alcohol abuse who was admitted 09/01 for generalized weakness and a mechanical fall found to have severe anemia and alcoholic hepatitis   1. Anemia - likely 2/2 hematomas. NO evidence of overt gastrointestinal bleeding. He has responded appropriately to transfusions. Hgb 7.5 this AM.   2. Alcoholic hepatitis - [place] DF 39. Steroids not given due to fever with no source of infection.Steroids do not prolong long-term survival. Total bilirubin increase to 19.7 this morning.    3. SIRS/Sepsis with FUO- Tmax 99.8 last night  4. Encephalopathy - likely toxic metabolic encephalopathy and potentially hepatic encephalopathy. AMS likely. Patient's is going through DTs.  4.  Rhabdomyelitis- CK improving 2165-->1507-->932. On IVF hydration LR 150 cc/hour.  5. Metabolic derangement - hyponatremia, hypokalemia, hypocalcemia, hypomagnesemia. Monitored and repleted by hospital team.   Recommendations:  -Agree with transfer to ICU and anticipate restarting Precedex  -Currently hemodynamically stable with no overt gastrointestinal blood loss -Continue to monitor H&H closely and transfuse as needed to ensure Hgb >7 -Continue Protonix and octreotide.  -No plans for endoscopy at present given severe systemic illness and no signs of active hemorrhage -Anemia 2/2 extensive hematoma. Dr. Allena Katz in ortho advised IR drainage -Continue to monitor LFTs daily -CIWA protocol -Prognosis is guarded.     Please call with questions or concerns.    Jacob Moores, PA-C [facility] [phone] (Cell)

## 2020-01-29 ENCOUNTER — Inpatient Hospital Stay: Payer: Self-pay

## 2020-01-29 DIAGNOSIS — R4182 Altered mental status, unspecified: Secondary | ICD-10-CM

## 2020-01-29 LAB — GLUCOSE, CAPILLARY
Glucose-Capillary: 92 mg/dL (ref 70–99)
Glucose-Capillary: 94 mg/dL (ref 70–99)
Glucose-Capillary: 89 mg/dL (ref 70–99)

## 2020-01-29 LAB — HEMOGLOBIN AND HEMATOCRIT, BLOOD
HCT: 26 % — ABNORMAL LOW (ref 39.0–52.0)
HCT: 24.6 % — ABNORMAL LOW (ref 39.0–52.0)
HCT: 26 % — ABNORMAL LOW (ref 39.0–52.0)
Hemoglobin: 8.4 g/dL — ABNORMAL LOW (ref 13.0–17.0)
Hemoglobin: 8.1 g/dL — ABNORMAL LOW (ref 13.0–17.0)
Hemoglobin: 8.5 g/dL — ABNORMAL LOW (ref 13.0–17.0)

## 2020-01-29 LAB — BLOOD GAS, ARTERIAL
Acid-Base Excess: 3.1 mmol/L — ABNORMAL HIGH (ref 0.0–2.0)
Bicarbonate: 26.9 mmol/L (ref 20.0–28.0)
FIO2: 50
MECHVT: 500 mL
Mechanical Rate: 20
O2 Saturation: 99.5 %
PEEP: 5 cmH2O
Patient temperature: 37
pCO2 arterial: 37 mmHg (ref 32.0–48.0)
pH, Arterial: 7.47 — ABNORMAL HIGH (ref 7.350–7.450)
pO2, Arterial: 157 mmHg — ABNORMAL HIGH (ref 83.0–108.0)

## 2020-01-29 LAB — CBC
HCT: 22.7 % — ABNORMAL LOW (ref 39.0–52.0)
Hemoglobin: 7.6 g/dL — ABNORMAL LOW (ref 13.0–17.0)
MCH: 34.9 pg — ABNORMAL HIGH (ref 26.0–34.0)
MCHC: 33.5 g/dL (ref 30.0–36.0)
MCV: 104.1 fL — ABNORMAL HIGH (ref 80.0–100.0)
Platelets: 92 10*3/uL — ABNORMAL LOW (ref 150–400)
RBC: 2.18 MIL/uL — ABNORMAL LOW (ref 4.22–5.81)
RDW: 23.3 % — ABNORMAL HIGH (ref 11.5–15.5)
WBC: 6.6 10*3/uL (ref 4.0–10.5)
nRBC: 3 % — ABNORMAL HIGH (ref 0.0–0.2)

## 2020-01-29 LAB — BPAM RBC
Blood Product Expiration Date: 202110032359
Blood Product Expiration Date: 202110052359
ISSUE DATE / TIME: 202109050923
ISSUE DATE / TIME: 202109062257
Unit Type and Rh: 5100
Unit Type and Rh: 5100

## 2020-01-29 LAB — COMPREHENSIVE METABOLIC PANEL
ALT: 43 U/L (ref 0–44)
AST: 117 U/L — ABNORMAL HIGH (ref 15–41)
Albumin: 2.3 g/dL — ABNORMAL LOW (ref 3.5–5.0)
Alkaline Phosphatase: 36 U/L — ABNORMAL LOW (ref 38–126)
Anion gap: 9 (ref 5–15)
BUN: 33 mg/dL — ABNORMAL HIGH (ref 6–20)
CO2: 26 mmol/L (ref 22–32)
Calcium: 7.2 mg/dL — ABNORMAL LOW (ref 8.9–10.3)
Chloride: 102 mmol/L (ref 98–111)
Creatinine, Ser: 0.46 mg/dL — ABNORMAL LOW (ref 0.61–1.24)
GFR calc Af Amer: >60 mL/min (ref >60)
GFR calc non Af Amer: >60 mL/min (ref >60)
Glucose, Bld: 93 mg/dL (ref 70–99)
Potassium: 4.1 mmol/L (ref 3.5–5.1)
Sodium: 137 mmol/L (ref 135–145)
Total Bilirubin: 15.3 mg/dL — ABNORMAL HIGH (ref 0.3–1.2)
Total Protein: 5.2 g/dL — ABNORMAL LOW (ref 6.5–8.1)

## 2020-01-29 LAB — TYPE AND SCREEN
ABO/RH(D): O POS
Antibody Screen: NEGATIVE
Unit division: 0
Unit division: 0

## 2020-01-29 LAB — CULTURE, BLOOD (ROUTINE X 2)
Culture: NO GROWTH
Culture: NO GROWTH
Special Requests: ADEQUATE
Special Requests: ADEQUATE

## 2020-01-29 LAB — MAGNESIUM: Magnesium: 2 mg/dL (ref 1.7–2.4)

## 2020-01-29 LAB — CALCIUM, IONIZED: Calcium, Ionized, Serum: 3.9 mg/dL — ABNORMAL LOW (ref 4.5–5.6)

## 2020-01-29 LAB — CK: Total CK: 424 U/L — ABNORMAL HIGH (ref 49–397)

## 2020-01-29 MED ORDER — PANTOPRAZOLE SODIUM 40 MG IV SOLR
40.0000 mg | Freq: Two times a day (BID) | INTRAVENOUS | Status: DC
  Administered 2020-01-29 – 2020-02-13 (×31): 40 mg via INTRAVENOUS
  Filled 2020-01-29 (×32): qty 40

## 2020-01-29 MED ORDER — VITAL HIGH PROTEIN PO LIQD
1000.0000 mL | ORAL | Status: DC
  Administered 2020-01-29 – 2020-01-31 (×3): 1000 mL

## 2020-01-29 MED ORDER — PROSOURCE TF PO LIQD
90.0000 mL | Freq: Three times a day (TID) | ORAL | Status: DC
  Administered 2020-01-29 – 2020-02-01 (×9): 90 mL
  Filled 2020-01-29: qty 90

## 2020-01-29 MED ORDER — FOLIC ACID 1 MG PO TABS
1.0000 mg | ORAL_TABLET | Freq: Every day | ORAL | Status: DC
  Administered 2020-01-30 – 2020-02-05 (×5): 1 mg
  Filled 2020-01-29 (×5): qty 1

## 2020-01-29 MED ORDER — THIAMINE HCL 100 MG/ML IJ SOLN
100.0000 mg | Freq: Every day | INTRAMUSCULAR | Status: DC
  Administered 2020-01-31 – 2020-02-01 (×2): 100 mg via INTRAVENOUS
  Filled 2020-01-29 (×3): qty 2

## 2020-01-29 MED ORDER — ADULT MULTIVITAMIN W/MINERALS CH
1.0000 | ORAL_TABLET | Freq: Every day | ORAL | Status: DC
  Administered 2020-01-30 – 2020-02-05 (×5): 1
  Filled 2020-01-29 (×5): qty 1

## 2020-01-29 MED ORDER — LACTULOSE 10 GM/15ML PO SOLN
30.0000 g | Freq: Two times a day (BID) | ORAL | Status: DC
  Administered 2020-01-29 – 2020-01-30 (×3): 30 g via ORAL
  Filled 2020-01-29 (×3): qty 60

## 2020-01-29 MED ORDER — RIFAXIMIN 550 MG PO TABS
550.0000 mg | ORAL_TABLET | Freq: Two times a day (BID) | ORAL | Status: DC
  Administered 2020-01-29 – 2020-02-02 (×9): 550 mg via ORAL
  Filled 2020-01-29 (×9): qty 1

## 2020-01-29 MED ORDER — THIAMINE HCL 100 MG PO TABS
100.0000 mg | ORAL_TABLET | Freq: Every day | ORAL | Status: DC
  Administered 2020-01-30: 100 mg
  Filled 2020-01-29: qty 1

## 2020-01-29 MED ORDER — QUETIAPINE FUMARATE 25 MG PO TABS
50.0000 mg | ORAL_TABLET | Freq: Two times a day (BID) | ORAL | Status: DC
  Administered 2020-01-29 – 2020-02-02 (×8): 50 mg
  Filled 2020-01-29 (×8): qty 2

## 2020-01-29 MED ORDER — METOPROLOL TARTRATE 25 MG PO TABS
12.5000 mg | ORAL_TABLET | Freq: Two times a day (BID) | ORAL | Status: DC
  Administered 2020-01-29 – 2020-02-05 (×8): 12.5 mg
  Filled 2020-01-29 (×11): qty 1

## 2020-01-29 NOTE — Progress Notes (Signed)
CRITICAL CARE NOTE 56 malewith a known history of polysubstance abuse including alcohol abuse who presented to the emergency room with acute onset of increased generalized weakness and amechanicalfall. +ETOH ABUSE lost balance and fell with subsequent right hip pain he usually drinks 5-6 beer at night.   ED COURSE: COVID-19 PCR negative.  Alcohol level was 296.Marland Kitchen  Stool Hemoccult was weakly positive.  Right upper quadrant ultrasound-steatosis with findings suggestive of portal hypertension.  RT HIP CT showed marked left lower soft tissue swelling suspicious for overlying contusive change and/or hematoma and questionable lucency along the posterolateral aspect of the right femoral head neck junction that could reflect a nondisplaced fracture or some projectional periarticular spurring. There is a suspicious lucency along the junction of the left inferior pubic ramus and pubic body best seen on the lateral radiograph. ORTHO WAS CONSULTED  DX of LIVER FAILURE GI CONSULTED  9/6 transferred to ICU for increased WOB, using accessory muscles to breathe with liver failure 9/7 remains on vent, liver failure, PICC line placed     CC  follow up respiratory failure  SUBJECTIVE Patient remains critically ill Prognosis is guarded Liver failure, resp failure    BP 118/62   Pulse 67   Temp 99 F (37.2 C) (Oral)   Resp 20   Ht 5' 7" (1.702 m)   Wt 108.9 kg   SpO2 100%   BMI 37.59 kg/m    I/O last 3 completed shifts: In: 945.7 [I.V.:383.7; Blood:462; IV Piggyback:100] Out: 1093 [Urine:1425] No intake/output data recorded.  SpO2: 100 % O2 Flow Rate (L/min): 3 L/min FiO2 (%): 50 %  Estimated body mass index is 37.59 kg/m as calculated from the following:   Height as of this encounter: 5' 7" (1.702 m).   Weight as of this encounter: 108.9 kg.  SIGNIFICANT EVENTS   REVIEW OF SYSTEMS  PATIENT IS UNABLE TO PROVIDE COMPLETE REVIEW OF SYSTEMS DUE TO SEVERE  CRITICAL ILLNESS        PHYSICAL EXAMINATION:  GENERAL:critically ill appearing, +resp distress +icteric sclera HEAD: Normocephalic, atraumatic.  EYES: Pupils equal, round, reactive to light.  No scleral icterus.  MOUTH: Moist mucosal membrane. NECK: Supple.  PULMONARY: +rhonchi, +wheezing CARDIOVASCULAR: S1 and S2. Regular rate and rhythm. No murmurs, rubs, or gallops.  GASTROINTESTINAL: Soft, nontender, -distended.  Positive bowel sounds.   MUSCULOSKELETAL: No swelling, clubbing, or edema. +jaundice NEUROLOGIC: obtunded, GCS<8 SKIN:intact,warm,dry  MEDICATIONS: I have reviewed all medications and confirmed regimen as documented   CULTURE RESULTS   Recent Results (from the past 240 hour(s))  SARS Coronavirus 2 by RT PCR (hospital order, performed in [facility] hospital lab) Nasopharyngeal Nasopharyngeal Swab     Status: None   Collection Time: 01/23/20  7:35 PM   Specimen: Nasopharyngeal Swab  Result Value Ref Range Status   SARS Coronavirus 2 NEGATIVE NEGATIVE Final    Comment: (NOTE) SARS-CoV-2 target nucleic acids are NOT DETECTED.  The SARS-CoV-2 RNA is generally detectable in upper and lower respiratory specimens during the acute phase of infection. The lowest concentration of SARS-CoV-2 viral copies this assay can detect is 250 copies / mL. A negative result does not preclude SARS-CoV-2 infection and should not be used as the sole basis for treatment or other patient management decisions.  A negative result may occur with improper specimen collection / handling, submission of specimen other than nasopharyngeal swab, presence of viral mutation(s) within the areas targeted by this assay, and inadequate number of viral copies (<250 copies / mL). A  negative result must be combined with clinical observations, patient history, and epidemiological information.  Fact Sheet for Patients:   StrictlyIdeas.no  Fact Sheet for Healthcare  Providers: BankingDealers.co.za  This test is not yet approved or  cleared by the [place] FDA and has been authorized for detection and/or diagnosis of SARS-CoV-2 by FDA under an Emergency Use Authorization (EUA).  This EUA will remain in effect (meaning this test can be used) for the duration of the COVID-19 declaration under Section 564(b)(1) of the Act, 21 U.S.C. section 360bbb-3(b)(1), unless the authorization is terminated or revoked sooner.  Performed at [facility], [address] [place], [place] [address]   CULTURE, BLOOD (ROUTINE X 2) w Reflex to ID Panel     Status: None (Preliminary result)   Collection Time: 01/24/20  4:15 PM   Specimen: BLOOD  Result Value Ref Range Status   Specimen Description BLOOD RIGHT ANTECUBITAL  Final   Special Requests   Final    BOTTLES DRAWN AEROBIC AND ANAEROBIC Blood Culture adequate volume   Culture   Final    NO GROWTH 4 DAYS Performed at [facility], [address] [place], [place] [address]    Report Status PENDING  Incomplete  CULTURE, BLOOD (ROUTINE X 2) w Reflex to ID Panel     Status: None (Preliminary result)   Collection Time: 01/24/20  4:15 PM   Specimen: BLOOD  Result Value Ref Range Status   Specimen Description BLOOD LEFT ANTECUBITAL  Final   Special Requests   Final    BOTTLES DRAWN AEROBIC AND ANAEROBIC Blood Culture adequate volume   Culture   Final    NO GROWTH 4 DAYS Performed at [facility], [address] [place], [place] [address]    Report Status PENDING  Incomplete  MRSA PCR Screening     Status: None   Collection Time: 01/28/20  2:19 PM   Specimen: Nasal Mucosa; Nasopharyngeal  Result Value Ref Range Status   MRSA by PCR NEGATIVE NEGATIVE Final    Comment:        The GeneXpert MRSA Assay (FDA approved for NASAL specimens only), is one component of a comprehensive MRSA colonization surveillance program. It is not intended to diagnose  MRSA infection nor to guide or monitor treatment for MRSA infections. Performed at [facility], [place], [place] [address]           IMAGING    DG Abd 1 View  Result Date: 01/28/2020 CLINICAL DATA:  Orogastric tube placement EXAM: ABDOMEN - 1 VIEW COMPARISON:  None. FINDINGS: A nasogastric tube is present with side port in the proximal stomach and distal port in the stomach body. Lower thoracic and upper lumbar spondylosis. Mild vascular accentuation in the lung bases. IMPRESSION: 1. Nasogastric tube is present with side port in the proximal stomach body. 2. Mild vascular accentuation in the lung bases. Electronically Signed   By: Van Clines M.D.   On: 01/28/2020 15:04   DG Chest Port 1 View  Result Date: 01/28/2020 CLINICAL DATA:  Endotracheal tube placement for respiratory failure. Acute toxic metabolic encephalopathy. Acute liver failure complicated by rhabdomyolysis. EXAM: PORTABLE CHEST 1 VIEW COMPARISON:  None. FINDINGS: Endotracheal tube tip is 2.7 cm above the carina, satisfactorily position. Nasogastric tube side port is in the vicinity of the gastric cardia with distal port in the stomach body. Mild enlargement of the cardiopericardial silhouette noted with indistinct pulmonary vasculature and cephalization of blood flow suspicious  for pulmonary venous hypertension. Borderline appearance for interstitial edema. IMPRESSION: 1. Endotracheal tube tip is 2.7 cm above the carina, satisfactorily positioned. 2. Mild enlargement of the cardiopericardial silhouette with suspected pulmonary venous hypertension and borderline appearance for interstitial edema. Electronically Signed   By: Walter  Liebkemann M.D.   On: 01/28/2020 15:08   US EKG SITE RITE  Result Date: 01/28/2020 If Site Rite image not attached, placement could not be confirmed due to current cardiac rhythm.    Nutrition Status:           Indwelling Urinary Catheter continued,  requirement due to   Reason to continue Indwelling Urinary Catheter strict Intake/Output monitoring for hemodynamic instability   Central Line/ continued, requirement due to  Reason to continue Centra Line Monitoring of central venous pressure or other hemodynamic parameters and poor IV access   Ventilator continued, requirement due to severe respiratory failure   Ventilator Sedation RASS 0 to -2      ASSESSMENT AND PLAN SYNOPSIS   Severe ACUTE Hypoxic and Hypercapnic Respiratory Failure severe liver failure with hepatic encephalopathy with acute aspiration pneumonia  -continue Full MV support -continue Bronchodilator Therapy -Wean Fio2 and PEEP as tolerated -will perform SAT/SBT when respiratory parameters are met -VAP/VENT bundle implementation   Morbid obesity, possible OSA.   Will certainly impact respiratory mechanics, ventilator weaning Suspect will need to consider additional PEEP, possible extubation to BiPAP when appropriate to consider   ACUTE LIVER Failure Follow up GI RECS    NEUROLOGY Acute toxic metabolic encephalopathy, need for sedation Goal RASS -2 to -3   CARDIAC ICU monitoring  ID Dx os aspiration pneumonia -continue IV abx as prescibed -follow up cultures  GI GI PROPHYLAXIS as indicated   DIET-->TF's as tolerated Constipation protocol as indicated  ENDO - will use ICU hypoglycemic\Hyperglycemia protocol if indicated     ELECTROLYTES -follow labs as needed -replace as needed -pharmacy consultation and following   DVT/GI PRX ordered and assessed TRANSFUSIONS AS NEEDED MONITOR FSBS I Assessed the need for Labs I Assessed the need for Foley I Assessed the need for Central Venous Line Family Discussion when available I Assessed the need for Mobilization I made an Assessment of medications to be adjusted accordingly Safety Risk assessment completed   CASE DISCUSSED IN MULTIDISCIPLINARY ROUNDS WITH ICU TEAM  Critical Care Time  devoted to patient care services described in this note is 34 minutes.   Overall, patient is critically ill, prognosis is guarded.  Patient with Multiorgan failure and at high risk for cardiac arrest and death.   Prognosis is poor, recommend DNR status Palliative care team consulted    David , M.D.  [facility]  Medical Director ICU-[facility] [place] Medical Director [facility] Cardio-Pulmonary Department      

## 2020-01-29 NOTE — Progress Notes (Signed)
Cody Darby, MD [address]  [place]  [address], [place] [address]  Main: [phone]  Fax: [phone] Pager: [phone]   Subjective: Patient was agitated and hypoxic, transferred to ICU yesterday due to increased work of breathing.  He was intubated and currently on vent.  OG tube has been placed and started on tube feeds today.  Per patient's nurse, he did not have any recent episodes of melena, coffee-ground emesis or hematemesis or rectal bleeding within last 24 to 48 hours.  His hemoglobin has been stable.  He is currently on cefepime.   Objective: Vital signs in last 24 hours: Vitals:   01/29/20 0800 01/29/20 1200 01/29/20 1300 01/29/20 1400  BP: 127/60 (!) 118/56 131/73 121/66  Pulse: 68 71 77 77  Resp: 20 20 (!) 21 20  Temp: 99.1 F (37.3 C) 100.1 F (37.8 C)    TempSrc: Oral Oral    SpO2: 100% 97% 99% 97%  Weight:      Height:       Weight change:   Intake/Output Summary (Last 24 hours) at 01/29/2020 1459 Last data filed at 01/29/2020 1416 Gross per 24 hour  Intake 2609.11 ml  Output 1115 ml  Net 1494.11 ml     Exam: Heart:: Regular rate and rhythm, S1S2 present or without murmur or extra heart sounds Lungs: normal and clear to auscultation Abdomen: soft, nontender, normal bowel sounds   Lab Results: CBC Latest Ref Rng & Units 01/29/2020 01/29/2020 01/28/2020  WBC 4.0 - 10.5 K/uL - 6.6 -  Hemoglobin 13.0 - 17.0 g/dL 8.4(L) 7.6(L) 6.6(L)  Hematocrit 39 - 52 % 26.0(L) 22.7(L) 19.6(L)  Platelets 150 - 400 K/uL - 92(L) -   CMP Latest Ref Rng & Units 01/29/2020 01/28/2020 01/27/2020  Glucose 70 - 99 mg/dL 93 112(H) 114(H)  BUN 6 - 20 mg/dL 33(H) 31(H) 37(H)  Creatinine 0.61 - 1.24 mg/dL 0.46(L) 0.55(L) 0.71  Sodium 135 - 145 mmol/L 137 140 136  Potassium 3.5 - 5.1 mmol/L 4.1 4.1 4.6  Chloride 98 - 111 mmol/L 102 103 102  CO2 22 - 32 mmol/L $RemoveB'26 27 26  'vutoXcAQ$ Calcium 8.9 - 10.3 mg/dL 7.2(L) 7.8(L) 6.9(L)  Total Protein 6.5 - 8.1 g/dL 5.2(L) 5.9(L) 5.6(L)   Total Bilirubin 0.3 - 1.2 mg/dL 15.3(H) 19.7(HH) 16.1(H)  Alkaline Phos 38 - 126 U/L 36(L) 39 38  AST 15 - 41 U/L 117(H) 158(H) 164(H)  ALT 0 - 44 U/L 43 58(H) 55(H)    Micro Results: Recent Results (from the past 240 hour(s))  SARS Coronavirus 2 by RT PCR (hospital order, performed in [facility] hospital lab) Nasopharyngeal Nasopharyngeal Swab     Status: None   Collection Time: 01/23/20  7:35 PM   Specimen: Nasopharyngeal Swab  Result Value Ref Range Status   SARS Coronavirus 2 NEGATIVE NEGATIVE Final    Comment: (NOTE) SARS-CoV-2 target nucleic acids are NOT DETECTED.  The SARS-CoV-2 RNA is generally detectable in upper and lower respiratory specimens during the acute phase of infection. The lowest concentration of SARS-CoV-2 viral copies this assay can detect is 250 copies / mL. A negative result does not preclude SARS-CoV-2 infection and should not be used as the sole basis for treatment or other patient management decisions.  A negative result may occur with improper specimen collection / handling, submission of specimen other than nasopharyngeal swab, presence of viral mutation(s) within the areas targeted by this assay, and inadequate number of viral copies (<250 copies / mL). A  negative result must be combined with clinical observations, patient history, and epidemiological information.  Fact Sheet for Patients:   StrictlyIdeas.no  Fact Sheet for Healthcare Providers: BankingDealers.co.za  This test is not yet approved or  cleared by the [place] FDA and has been authorized for detection and/or diagnosis of SARS-CoV-2 by FDA under an Emergency Use Authorization (EUA).  This EUA will remain in effect (meaning this test can be used) for the duration of the COVID-19 declaration under Section 564(b)(1) of the Act, 21 U.S.C. section 360bbb-3(b)(1), unless the authorization is terminated or revoked sooner.  Performed  at [facility], [address] [place], [place] [address]   CULTURE, BLOOD (ROUTINE X 2) w Reflex to ID Panel     Status: None   Collection Time: 01/24/20  4:15 PM   Specimen: BLOOD  Result Value Ref Range Status   Specimen Description BLOOD RIGHT ANTECUBITAL  Final   Special Requests   Final    BOTTLES DRAWN AEROBIC AND ANAEROBIC Blood Culture adequate volume   Culture   Final    NO GROWTH 5 DAYS Performed at [facility], [address] [place], [place] [address]    Report Status 01/29/2020 FINAL  Final  CULTURE, BLOOD (ROUTINE X 2) w Reflex to ID Panel     Status: None   Collection Time: 01/24/20  4:15 PM   Specimen: BLOOD  Result Value Ref Range Status   Specimen Description BLOOD LEFT ANTECUBITAL  Final   Special Requests   Final    BOTTLES DRAWN AEROBIC AND ANAEROBIC Blood Culture adequate volume   Culture   Final    NO GROWTH 5 DAYS Performed at [facility], [address] [place], [place] [address]    Report Status 01/29/2020 FINAL  Final  MRSA PCR Screening     Status: None   Collection Time: 01/28/20  2:19 PM   Specimen: Nasal Mucosa; Nasopharyngeal  Result Value Ref Range Status   MRSA by PCR NEGATIVE NEGATIVE Final    Comment:        The GeneXpert MRSA Assay (FDA approved for NASAL specimens only), is one component of a comprehensive MRSA colonization surveillance program. It is not intended to diagnose MRSA infection nor to guide or monitor treatment for MRSA infections. Performed at [facility] [address] [place], [place] [address]    Studies/Results: [place] Abd 1 View  Result Date: 01/28/2020 CLINICAL DATA:  Orogastric tube placement EXAM: ABDOMEN - 1 VIEW COMPARISON:  None. FINDINGS: A nasogastric tube is present with side port in the proximal stomach and distal port in the stomach body. Lower thoracic and upper lumbar spondylosis. Mild vascular accentuation in the lung bases. IMPRESSION: 1. Nasogastric  tube is present with side port in the proximal stomach body. 2. Mild vascular accentuation in the lung bases. Electronically Signed   By: Van Clines M.D.   On: 01/28/2020 15:04   MR HIP RIGHT WO CONTRAST  Result Date: 01/27/2020 CLINICAL DATA:  Right gluteal hematoma, for further characterization. EXAM: MR OF THE RIGHT HIP WITHOUT CONTRAST TECHNIQUE: Multiplanar, multisequence MR imaging was performed. No intravenous contrast was administered. COMPARISON:  CT pelvis 01/24/2020 FINDINGS: The patient terminated the exam following the completion of coronal T1 and T2 weighted images. These images are adversely affected by motion artifact. The patient did not allow completion of the exam, and due to these factors, diagnostic sensitivity and specificity of the examination is reduced. Bones: Accentuated T2 signal and reduced  T1 signal in the pubic bodies bilaterally suspicious for osteitis pubis. No surrounding fluid collection to suggest infection. SI joints grossly unremarkable. No observed fracture. Articular cartilage and labrum Articular cartilage: Difficult to assess given the lack of small field-of-view images and the motion artifact. Labrum: Today's exam is not diagnostic for assessment of the labrum. Joint or bursal effusion Joint effusion:  Absent Bursae: No bursitis. Muscles and tendons Muscles and tendons: Heterogeneous hematoma primarily in the right gluteus medius muscle, but also tracking up through the sciatic notch along the right piriformis muscle, measuring approximately 22.1 by 13.5 by 8.5 cm (volume = 1300 cm^3). By my estimates this is similar in volume to the prior CT from 01/24/2020, indicating no significant change over the 12 hours between the 2 studies. The hematoma appears to moderately impinge on the right sciatic nerve at and distal to the sciatic notch. Low-level asymmetric edema in the right gluteus maximus muscle. Trace edema along the right hip adductor musculature. Other  findings Miscellaneous: We partially include apparent hematoma in the subcutaneous tissues lateral to the left gluteus musculature, better shown on the CT examination. There is subcutaneous edema along both buttock and flank regions. Mild edema in the retroperitoneum, presacral region, and along the right pelvic sidewall. IMPRESSION: 1. Heterogeneous hematoma primarily in the right gluteus medius muscle, but also tracking up through the sciatic notch along the right piriformis muscle. Similar volume to prior CT about 1300 cubic cm. The hematoma appears to moderately impinge on the right sciatic nerve at and distal to the sciatic notch. 2. Low-level edema in the right gluteus maximus and right hip adductor musculature. 3. Subcutaneous edema along both buttock and flank regions. 4. 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 5. Subcutaneous hematoma lateral to the left gluteal musculature, better shown on the prior CT. 6. Mild edema in the retroperitoneum, presacral region, and along the right pelvic sidewall. 7. The patient terminated the exam following the completion of the exam, and due to motion artifact. The patient did not allow completion of the exam, and due to these factors, diagnostic sensitivity and specificity of the examination is reduced. Electronically Signed   By: Van Clines M.D.   On: 01/27/2020 15:44   DG Chest Port 1 View  Result Date: 01/28/2020 CLINICAL DATA:  Endotracheal tube placement for respiratory failure. Acute toxic metabolic encephalopathy. Acute liver failure complicated by rhabdomyolysis. EXAM: PORTABLE CHEST 1 VIEW COMPARISON:  None. FINDINGS: Endotracheal tube tip is 2.7 cm above the carina, satisfactorily position. Nasogastric tube side port is in the vicinity of the gastric cardia with distal port in the stomach body. Mild enlargement of the cardiopericardial silhouette noted with indistinct pulmonary vasculature and cephalization of blood flow suspicious for pulmonary venous hypertension.  Borderline appearance for interstitial edema. IMPRESSION: 1. Endotracheal tube tip is 2.7 cm above the carina, satisfactorily positioned. 2. Mild enlargement of the cardiopericardial silhouette with suspected pulmonary venous hypertension and borderline appearance for interstitial edema. Electronically Signed   By: Van Clines M.D.   On: 01/28/2020 15:08   [place] EKG SITE RITE  Result Date: 01/28/2020 If Site Rite image not attached, placement could not be confirmed due to current cardiac rhythm.  Medications:  I have reviewed the patient's current medications. Prior to Admission:  Medications Prior to Admission  Medication Sig Dispense Refill Last Dose  . atorvastatin (LIPITOR) 20 MG tablet Take 20 mg by mouth at bedtime.   Past Week at Unknown time  . doxycycline (VIBRAMYCIN) 100 MG capsule Take 100 mg by  mouth 2 (two) times daily.   Past Week at Unknown time  . metoprolol succinate (TOPROL-XL) 25 MG 24 hr tablet Take 25 mg by mouth daily.   Past Week at Unknown time   Scheduled: . chlorhexidine gluconate (MEDLINE KIT)  15 mL Mouth Rinse BID  . Chlorhexidine Gluconate Cloth  6 each Topical Daily  . feeding supplement (PROSource TF)  90 mL Per Tube TID  . feeding supplement (VITAL HIGH PROTEIN)  1,000 mL Per Tube Q24H  . [START ON 11/24/4191] folic acid  1 mg Per Tube Daily  . mouth rinse  15 mL Mouth Rinse 10 times per day  . metoprolol tartrate  12.5 mg Per Tube BID  . [START ON 01/30/2020] multivitamin with minerals  1 tablet Per Tube Daily  . pantoprazole (PROTONIX) IV  40 mg Intravenous Q12H  . QUEtiapine  50 mg Per Tube BID  . sodium chloride flush  10-40 mL Intracatheter Q12H  . [START ON 01/30/2020] thiamine  100 mg Per Tube Daily   Or  . [START ON 01/30/2020] thiamine  100 mg Intravenous Daily   Continuous: . sodium chloride Stopped (01/27/20 1744)  . ceFEPime (MAXIPIME) IV 2 g (01/29/20 1416)  . dexmedetomidine (PRECEDEX) IV infusion    . fentaNYL infusion INTRAVENOUS 200  mcg/hr (01/29/20 1400)  . midazolam 2 mg/hr (01/29/20 1400)  . propofol (DIPRIVAN) infusion 30 mcg/kg/min (01/29/20 1400)   XTK:WIOXBD chloride, acetaminophen **OR** acetaminophen, ondansetron **OR** ondansetron (ZOFRAN) IV, sodium chloride flush, traZODone Anti-infectives (From admission, onward)   Start     Dose/Rate Route Frequency Ordered Stop   01/28/20 1515  ceFEPIme (MAXIPIME) 2 g in sodium chloride 0.9 % 100 mL IVPB        2 g 200 mL/hr over 30 Minutes Intravenous Every 8 hours 01/28/20 1504     01/24/20 1600  cefTRIAXone (ROCEPHIN) 2 g in sodium chloride 0.9 % 100 mL IVPB  Status:  Discontinued        2 g 200 mL/hr over 30 Minutes Intravenous Every 24 hours 01/24/20 1520 01/28/20 1443   01/23/20 2045  cefTRIAXone (ROCEPHIN) 1 g in sodium chloride 0.9 % 100 mL IVPB        1 g 200 mL/hr over 30 Minutes Intravenous  Once 01/23/20 2042 01/23/20 2216     Scheduled Meds: . chlorhexidine gluconate (MEDLINE KIT)  15 mL Mouth Rinse BID  . Chlorhexidine Gluconate Cloth  6 each Topical Daily  . feeding supplement (PROSource TF)  90 mL Per Tube TID  . feeding supplement (VITAL HIGH PROTEIN)  1,000 mL Per Tube Q24H  . [START ON 09/24/2990] folic acid  1 mg Per Tube Daily  . mouth rinse  15 mL Mouth Rinse 10 times per day  . metoprolol tartrate  12.5 mg Per Tube BID  . [START ON 01/30/2020] multivitamin with minerals  1 tablet Per Tube Daily  . pantoprazole (PROTONIX) IV  40 mg Intravenous Q12H  . QUEtiapine  50 mg Per Tube BID  . sodium chloride flush  10-40 mL Intracatheter Q12H  . [START ON 01/30/2020] thiamine  100 mg Per Tube Daily   Or  . [START ON 01/30/2020] thiamine  100 mg Intravenous Daily   Continuous Infusions: . sodium chloride Stopped (01/27/20 1744)  . ceFEPime (MAXIPIME) IV 2 g (01/29/20 1416)  . dexmedetomidine (PRECEDEX) IV infusion    . fentaNYL infusion INTRAVENOUS 200 mcg/hr (01/29/20 1400)  . midazolam 2 mg/hr (01/29/20 1400)  . propofol (DIPRIVAN) infusion 30  mcg/kg/min (01/29/20 1400)   PRN Meds:.sodium chloride, acetaminophen **OR** acetaminophen, ondansetron **OR** ondansetron (ZOFRAN) IV, sodium chloride flush, traZODone   Assessment: Principal Problem:   Sepsis ([place]) Active Problems:   Acute blood loss anemia   GIB (gastrointestinal bleeding)   Transaminitis   Hyponatremia   Hypokalemia   Falls   Hip pain   Rhabdomyolysis   Hypovolemia   Hypotension   Plan: Acute alcoholic hepatitis:  Moderate discriminant function is 39, steroids have not been recommended due to possible infection, with one episode of fever, currently being treated for aspiration pneumonia Bilirubin is slowly trending down No evidence of GI bleed Recommend to discontinue octreotide as it has been 7 days Continue pantoprazole 40 mg IV twice daily Agree with tube feeds and titrate to goal Nutrition is key to recover from alcoholic hepatitis Complete abstinence from alcohol use  Altered mental status s/p intubation Probable underlying hepatic encephalopathy Recommend rifaximin 550 mg via OG tube twice daily Recommend lactulose 30 g 2-3 times daily and titrate to have loose/soft bowel movements not more than 2 per day    LOS: 6 days   Ezzard Ditmer 01/29/2020, 2:59 PM

## 2020-01-29 NOTE — Progress Notes (Signed)
Initial Nutrition Assessment  DOCUMENTATION CODES:   Obesity unspecified  INTERVENTION:  Initiate Vital High Protein at 35 mL/hr (840 mL goal daily volume) + PROSource 90 mL TID per tube. Provides 1080 kcal, 140 grams of protein, 706 mL H2O daily. With current propofol rate provides 1597 kcal daily.  Continue MVI daily per tube.  NUTRITION DIAGNOSIS:   Inadequate oral intake related to inability to eat as evidenced by NPO status.  GOAL:   Patient will meet greater than or equal to 90% of their needs  MONITOR:   Vent status, Labs, Weight trends, TF tolerance, I & O's  REASON FOR ASSESSMENT:   Ventilator, Consult Enteral/tube feeding initiation and management  ASSESSMENT:   49 year old male with PMHx of polysubstance abuse, EtOH abuse admitted with acute onset of generalized weakness and mechanical fall with subsequent right hip pain and large right hip hematoma, also with liver failure, hepatic encephalopathy, acute aspiration PNA, possible OSA.   9/6 transferred to ICU and intubated  No meal documentation available in chart from prior to intubation so unsure how patient was eating.  Patient is currently intubated on ventilator support MV: 10 L/min Temp (24hrs), Avg:98.4 F (36.9 C), Min:98 F (36.7 C), Max:99.1 F (37.3 C)  Propofol: 19.6 ml/hr (517 kcal daily)  Medications reviewed and include: folic acid 1 mg daily per tube, MVI daily per tube, Protonix, thiamine 100 mg daily per tube, cefepime, fentanyl gtt, Versed gtt, octreotide, propofol gtt.  Labs reviewed: CBG 92-98, BUN 33, Creatinine 0.46.  I/O: 1425 mL UOP yesterday (0.5 mL/kg/hr)  Enteral Access: OGT; terminates in stomach per abdominal x-ray 9/6  Discussed with RN and on rounds. Plan is to start tube feeds today.  NUTRITION - FOCUSED PHYSICAL EXAM:    Most Recent Value  Orbital Region No depletion  Upper Arm Region No depletion  Thoracic and Lumbar Region No depletion  Buccal Region Unable to  assess  Temple Region No depletion  Clavicle Bone Region No depletion  Clavicle and Acromion Bone Region No depletion  Scapular Bone Region Unable to assess  Dorsal Hand No depletion  Patellar Region No depletion  Anterior Thigh Region No depletion  Posterior Calf Region No depletion  Edema (RD Assessment) None  Hair Reviewed  Eyes Unable to assess  Mouth Unable to assess  Skin Reviewed  [jaundice,  ecchymosis]  Nails Reviewed     Diet Order:   Diet Order    None     EDUCATION NEEDS:   No education needs have been identified at this time  Skin:  Skin Assessment: Reviewed RN Assessment  Last BM:  01/27/2020 - smear  Height:   Ht Readings from Last 1 Encounters:  01/24/20 5\' 7"  (1.702 m)   Weight:   Wt Readings from Last 1 Encounters:  01/24/20 108.9 kg   Ideal Body Weight:  67.3 kg  BMI:  Body mass index is 37.59 kg/m.  Estimated Nutritional Needs:   Kcal:  03/25/20 (11-14 kcal/kg)  Protein:  135 grams (2 grams/kg IBW)  Fluid:  1.8 L/day  [phone], MS, RD, LDN Pager number available on Amion

## 2020-01-30 DIAGNOSIS — Z515 Encounter for palliative care: Secondary | ICD-10-CM

## 2020-01-30 DIAGNOSIS — M6282 Rhabdomyolysis: Secondary | ICD-10-CM

## 2020-01-30 DIAGNOSIS — D62 Acute posthemorrhagic anemia: Secondary | ICD-10-CM

## 2020-01-30 DIAGNOSIS — F1010 Alcohol abuse, uncomplicated: Secondary | ICD-10-CM

## 2020-01-30 DIAGNOSIS — Z7189 Other specified counseling: Secondary | ICD-10-CM

## 2020-01-30 LAB — HEMOGLOBIN AND HEMATOCRIT, BLOOD
HCT: 27.1 % — ABNORMAL LOW (ref 39.0–52.0)
HCT: 29 % — ABNORMAL LOW (ref 39.0–52.0)
HCT: 28.6 % — ABNORMAL LOW (ref 39.0–52.0)
Hemoglobin: 8.6 g/dL — ABNORMAL LOW (ref 13.0–17.0)
Hemoglobin: 9.1 g/dL — ABNORMAL LOW (ref 13.0–17.0)
Hemoglobin: 9.2 g/dL — ABNORMAL LOW (ref 13.0–17.0)

## 2020-01-30 LAB — COMPREHENSIVE METABOLIC PANEL
ALT: 38 U/L (ref 0–44)
AST: 92 U/L — ABNORMAL HIGH (ref 15–41)
Albumin: 2.5 g/dL — ABNORMAL LOW (ref 3.5–5.0)
Alkaline Phosphatase: 40 U/L (ref 38–126)
Anion gap: 8 (ref 5–15)
BUN: 33 mg/dL — ABNORMAL HIGH (ref 6–20)
CO2: 27 mmol/L (ref 22–32)
Calcium: 7.6 mg/dL — ABNORMAL LOW (ref 8.9–10.3)
Chloride: 106 mmol/L (ref 98–111)
Creatinine, Ser: 0.86 mg/dL (ref 0.61–1.24)
GFR calc Af Amer: >60 mL/min (ref >60)
GFR calc non Af Amer: >60 mL/min (ref >60)
Glucose, Bld: 120 mg/dL — ABNORMAL HIGH (ref 70–99)
Potassium: 4.1 mmol/L (ref 3.5–5.1)
Sodium: 141 mmol/L (ref 135–145)
Total Bilirubin: 12.6 mg/dL — ABNORMAL HIGH (ref 0.3–1.2)
Total Protein: 5.9 g/dL — ABNORMAL LOW (ref 6.5–8.1)

## 2020-01-30 LAB — CBC WITH DIFFERENTIAL/PLATELET
Abs Immature Granulocytes: 0.22 10*3/uL — ABNORMAL HIGH (ref 0.00–0.07)
Basophils Absolute: 0.1 10*3/uL (ref 0.0–0.1)
Basophils Relative: 1 %
Eosinophils Absolute: 0.5 10*3/uL (ref 0.0–0.5)
Eosinophils Relative: 6 %
HCT: 26.6 % — ABNORMAL LOW (ref 39.0–52.0)
Hemoglobin: 8.8 g/dL — ABNORMAL LOW (ref 13.0–17.0)
Immature Granulocytes: 3 %
Lymphocytes Relative: 15 %
Lymphs Abs: 1.2 10*3/uL (ref 0.7–4.0)
MCH: 34.1 pg — ABNORMAL HIGH (ref 26.0–34.0)
MCHC: 33.1 g/dL (ref 30.0–36.0)
MCV: 103.1 fL — ABNORMAL HIGH (ref 80.0–100.0)
Monocytes Absolute: 1 10*3/uL (ref 0.1–1.0)
Monocytes Relative: 12 %
Neutro Abs: 5.2 10*3/uL (ref 1.7–7.7)
Neutrophils Relative %: 63 %
Platelets: 117 10*3/uL — ABNORMAL LOW (ref 150–400)
RBC: 2.58 MIL/uL — ABNORMAL LOW (ref 4.22–5.81)
RDW: 24.1 % — ABNORMAL HIGH (ref 11.5–15.5)
Smear Review: NORMAL
WBC: 8.2 10*3/uL (ref 4.0–10.5)
nRBC: 2.3 % — ABNORMAL HIGH (ref 0.0–0.2)

## 2020-01-30 LAB — GLUCOSE, CAPILLARY
Glucose-Capillary: 111 mg/dL — ABNORMAL HIGH (ref 70–99)
Glucose-Capillary: 114 mg/dL — ABNORMAL HIGH (ref 70–99)
Glucose-Capillary: 91 mg/dL (ref 70–99)
Glucose-Capillary: 93 mg/dL (ref 70–99)
Glucose-Capillary: 97 mg/dL (ref 70–99)
Glucose-Capillary: 111 mg/dL — ABNORMAL HIGH (ref 70–99)
Glucose-Capillary: 90 mg/dL (ref 70–99)

## 2020-01-30 LAB — MAGNESIUM: Magnesium: 2 mg/dL (ref 1.7–2.4)

## 2020-01-30 LAB — PHOSPHORUS: Phosphorus: UNDETERMINED mg/dL (ref 2.5–4.6)

## 2020-01-30 LAB — AMMONIA: Ammonia: 48 umol/L — ABNORMAL HIGH (ref 9–35)

## 2020-01-30 MED ORDER — LACTULOSE 10 GM/15ML PO SOLN: 30.0000 g | Freq: Two times a day (BID) | ORAL | Status: DC | PRN

## 2020-01-30 NOTE — Progress Notes (Signed)
CRITICAL CARE NOTE 21 malewith a known history of polysubstance abuse including alcohol abuse who presented to the emergency room with acute onset of increased generalized weakness and amechanicalfall. +ETOH ABUSE lost balance and fell with subsequent right hip pain he usually drinks 5-6 beer at night.   EDCOURSE: COVID-19 PCR negative.  Alcohol level was 296.Marland Kitchen  Stool Hemoccult was weakly positive.  Right upper quadrant ultrasound-steatosis with findings suggestive of portal hypertension.  RT HIPCT showed marked left lower soft tissue swelling suspicious for overlying contusive change and/or hematoma and questionable lucency along the posterolateral aspect of the right femoral head neck junction that could reflect a nondisplaced fracture or some projectional periarticular spurring. There is a suspicious lucency along the junction of the left inferior pubic ramus and pubic body best seen on the lateral radiograph. ORTHO WAS CONSULTED  DX of LIVER FAILURE GI CONSULTED  9/6 transferred to ICU for increased WOB, using accessory muscles to breathe with liver failure 9/7 remains on vent, liver failure, PICC line placed, brother updated 9/8 remains encephalopathic, liver failure, resp failure  CC  follow up respiratory failure  SUBJECTIVE Patient remains critically ill Prognosis is guarded multiorgan failure Severe DT's    BP 108/67   Pulse 75   Temp (!) 100.9 F (38.3 C) (Axillary)   Resp 20   Ht 5\' 7"  (1.702 m)   Wt 113.2 kg   SpO2 99%   BMI 39.09 kg/m    I/O last 3 completed shifts: In: 3133.6 [I.V.:2130.2; Blood:462; NG/GT:220; IV Piggyback:321.4] Out: 2090 [Urine:1890; Stool:200] No intake/output data recorded.  SpO2: 99 % O2 Flow Rate (L/min): 3 L/min FiO2 (%): 35 %  Estimated body mass index is 39.09 kg/m as calculated from the following:   Height as of this encounter: 5\' 7"  (1.702 m).   Weight as of this encounter: 113.2 kg.  SIGNIFICANT  EVENTS   REVIEW OF SYSTEMS  PATIENT IS UNABLE TO PROVIDE COMPLETE REVIEW OF SYSTEMS DUE TO SEVERE CRITICAL ILLNESS        PHYSICAL EXAMINATION:  GENERAL:critically ill appearing, +resp distress +icteric sclera HEAD: Normocephalic, atraumatic.  EYES: Pupils equal, round, reactive to light.  No scleral icterus.  MOUTH: Moist mucosal membrane. NECK: Supple.  PULMONARY: +rhonchi, +wheezing CARDIOVASCULAR: S1 and S2. Regular rate and rhythm. No murmurs, rubs, or gallops.  GASTROINTESTINAL: Soft, nontender, -distended.  Positive bowel sounds.   MUSCULOSKELETAL: No swelling, clubbing, or edema. +jaundice ecchymosis everywhere NEUROLOGIC: obtunded, GCS<8 SKIN:intact,warm,dry  MEDICATIONS: I have reviewed all medications and confirmed regimen as documented   CULTURE RESULTS   Recent Results (from the past 240 hour(s))  SARS Coronavirus 2 by RT PCR (hospital order, performed in [facility] hospital lab) Nasopharyngeal Nasopharyngeal Swab     Status: None   Collection Time: 01/23/20  7:35 PM   Specimen: Nasopharyngeal Swab  Result Value Ref Range Status   SARS Coronavirus 2 NEGATIVE NEGATIVE Final    Comment: (NOTE) SARS-CoV-2 target nucleic acids are NOT DETECTED.  The SARS-CoV-2 RNA is generally detectable in upper and lower respiratory specimens during the acute phase of infection. The lowest concentration of SARS-CoV-2 viral copies this assay can detect is 250 copies / mL. A negative result does not preclude SARS-CoV-2 infection and should not be used as the sole basis for treatment or other patient management decisions.  A negative result may occur with improper specimen collection / handling, submission of specimen other than nasopharyngeal swab, presence of viral mutation(s) within the areas targeted by this assay, and inadequate  number of viral copies (<250 copies / mL). A negative result must be combined with clinical observations, patient history, and epidemiological  information.  Fact Sheet for Patients:   BoilerBrush.com.cy  Fact Sheet for Healthcare Providers: https://pope.com/  This test is not yet approved or  cleared by the [place] FDA and has been authorized for detection and/or diagnosis of SARS-CoV-2 by FDA under an Emergency Use Authorization (EUA).  This EUA will remain in effect (meaning this test can be used) for the duration of the COVID-19 declaration under Section 564(b)(1) of the Act, 21 U.S.C. section 360bbb-3(b)(1), unless the authorization is terminated or revoked sooner.  Performed at [facility], [address] [place], [place] [address]   CULTURE, BLOOD (ROUTINE X 2) w Reflex to ID Panel     Status: None   Collection Time: 01/24/20  4:15 PM   Specimen: BLOOD  Result Value Ref Range Status   Specimen Description BLOOD RIGHT ANTECUBITAL  Final   Special Requests   Final    BOTTLES DRAWN AEROBIC AND ANAEROBIC Blood Culture adequate volume   Culture   Final    NO GROWTH 5 DAYS Performed at [facility], [address] [place], [place] [address]    Report Status 01/29/2020 FINAL  Final  CULTURE, BLOOD (ROUTINE X 2) w Reflex to ID Panel     Status: None   Collection Time: 01/24/20  4:15 PM   Specimen: BLOOD  Result Value Ref Range Status   Specimen Description BLOOD LEFT ANTECUBITAL  Final   Special Requests   Final    BOTTLES DRAWN AEROBIC AND ANAEROBIC Blood Culture adequate volume   Culture   Final    NO GROWTH 5 DAYS Performed at [facility], [address] [place], [place] [address]    Report Status 01/29/2020 FINAL  Final  MRSA PCR Screening     Status: None   Collection Time: 01/28/20  2:19 PM   Specimen: Nasal Mucosa; Nasopharyngeal  Result Value Ref Range Status   MRSA by PCR NEGATIVE NEGATIVE Final    Comment:        The GeneXpert MRSA Assay (FDA approved for NASAL specimens only), is one component of  a comprehensive MRSA colonization surveillance program. It is not intended to diagnose MRSA infection nor to guide or monitor treatment for MRSA infections. Performed at [facility], [address] [place], [place] [address]    BMP Latest Ref Rng & Units 01/30/2020 01/29/2020 01/28/2020  Glucose 70 - 99 mg/dL 497(W) 93 263(Z)  BUN 6 - 20 mg/dL 85(Y) 85(O) 27(X)  Creatinine 0.61 - 1.24 mg/dL 4.12 8.78(M) 7.67(M)  Sodium 135 - 145 mmol/L 141 137 140  Potassium 3.5 - 5.1 mmol/L 4.1 4.1 4.1  Chloride 98 - 111 mmol/L 106 102 103  CO2 22 - 32 mmol/L 27 26 27   Calcium 8.9 - 10.3 mg/dL 7.6(L) 7.2(L) 7.8(L)     IMAGING    No results found.   Nutrition Status: Nutrition Problem: Inadequate oral intake Etiology: inability to eat Signs/Symptoms: NPO status Interventions: Tube feeding, Prostat, MVI     Indwelling Urinary Catheter continued, requirement due to   Reason to continue Indwelling Urinary Catheter strict Intake/Output monitoring for hemodynamic instability   Central Line/ continued, requirement due to  Reason to continue Monitoring of central venous pressure or other hemodynamic parameters and poor IV access   Ventilator continued, requirement due to severe respiratory failure   Ventilator Sedation RASS 0  to -2      ASSESSMENT AND PLAN SYNOPSIS   Severe ACUTE Hypoxic and Hypercapnic Respiratory Failure due to multiorgan failure with liver failure, resp failure and aspiration pneumonia with severe DT's  -continue Full MV support -continue Bronchodilator Therapy -Wean Fio2 and PEEP as tolerated -VAP/VENT bundle implementation  ACUTE DIASTOLIC CARDIAC FAILURE-  -oxygen as needed -Lasix as tolerated  Morbid obesity, possible OSA.   Will certainly impact respiratory mechanics, ventilator weaning Suspect will need to consider additional PEEP    NEUROLOGY Acute toxic metabolic encephalopathy, need for sedation Goal RASS -2 to  -3  CARDIAC ICU monitoring  ID -continue IV abx as prescibed -follow up cultures  GI GI PROPHYLAXIS as indicated   DIET-->TF's as tolerated Constipation protocol as indicated  ENDO - will use ICU hypoglycemic\Hyperglycemia protocol if indicated     ELECTROLYTES -follow labs as needed -replace as needed -pharmacy consultation and following   DVT/GI PRX ordered and assessed TRANSFUSIONS AS NEEDED MONITOR FSBS I Assessed the need for Labs I Assessed the need for Foley I Assessed the need for Central Venous Line Family Discussion when available I Assessed the need for Mobilization I made an Assessment of medications to be adjusted accordingly Safety Risk assessment completed   CASE DISCUSSED IN MULTIDISCIPLINARY ROUNDS WITH ICU TEAM  Critical Care Time devoted to patient care services described in this note is 34 minutes.   Overall, patient is critically ill, prognosis is guarded.  Patient with Multiorgan failure and at high risk for cardiac arrest and death.    Lucie Leather, M.D.  [facility]  Medical Director [facility] Medical Director [facility] Cardio-Pulmonary Department

## 2020-01-30 NOTE — Consult Note (Signed)
Consultation Note Date: 01/30/2020   Patient Name: [name]  DOB: 1971/05/16  MRN: [id number]  Age / Sex: 49 y.o., male  PCP: Remi Haggard, FNP Referring Physician: Flora Lipps, MD  Reason for Consultation: Establishing goals of care  HPI/Patient Profile: 49 y.o. male  with past medical history of ETOH abuse admitted on 01/23/2020 with generalized weakness and a fall. Diagnosed with acute alcoholic hepatitis. Is also being treated for aspiration pna. Probable hepatic encephalopathy. Also with acute diastolic heart failure. PMT consulted to discuss [place].   Clinical Assessment and Goals of Care: I have reviewed medical records including EPIC notes, labs and imaging, received report from RN, assessed the patient and then met with patient's brother, [name],  to discuss diagnosis prognosis, [place], EOL wishes, disposition and options.  I introduced Palliative Medicine as specialized medical care for people living with serious illness. It focuses on providing relief from the symptoms and stress of a serious illness. The goal is to improve quality of life for both the patient and the family.  We discussed a brief life review of the patient.  Patient's father passed away almost 20 years ago. His mother passed away 03-Oct-2019. Patient is not married and does not have children. He does have a fiance, Theodis Shove. 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 He has had a life long struggle with substance abuse.   His brother tells me that [name] has been feeling unwell for the past couple of months - less active, weak. He tells me [name] talked to him about giving away his belongings - essentially he seemed to be preparing for his own death.    We discussed patient's current illness and what it means in the larger context of patient's on-going co-morbidities.  Natural disease trajectory and expectations at  EOL were discussed. We discussed multi-organ failure and dependence on mechanical ventilation. We discussed concern about poor prognosis.   I attempted to elicit values and goals of care important to the patient. The difference between aggressive medical intervention and comfort care was considered in light of the patient's goals of care.   Brother tells me about his personal experience - dependence on life support at a young age and ultimately survived to live a meaningful life. Because of this experience he is hopeful for the same results for [name]. We discussed this scenario is difference d/t [name]'s multiorgan failure.   Brother's main goal is to be able to have some verbal interaction with [name] - he expresses desire for sedation to be lessened - we discussed this is something we will evaluate day to day. Discussed concern for DTs.  Discussed with brother the importance of continued conversation with family and the medical providers regarding overall plan of care and treatment options, ensuring decisions are within the context of the patient's values and GOCs.    Questions and concerns were addressed. The family was encouraged to call with questions or concerns.   Primary Decision Maker NEXT OF KIN - brother [name]  SUMMARY OF RECOMMENDATIONS   - full code full scope - brother educated on severe illness - PMT will stay involved, plan for regular contact with brother and ongoing [place] conversations  Code Status/Advance Care Planning:  Full code  Prognosis:   Unable to determine  Discharge Planning: To Be Determined      Primary Diagnoses: Present on Admission: . Acute blood loss anemia . GIB (gastrointestinal bleeding) .  Transaminitis . Hyponatremia . Hypokalemia . Falls . Hip pain . Rhabdomyolysis . Sepsis ([place]) . Hypovolemia . Hypotension   I have reviewed the medical record, interviewed the patient and family, and examined the patient. The following aspects are  pertinent.  Past Medical History:  Diagnosis Date  . Polysubstance abuse ([place])    Social History   Socioeconomic History  . Marital status: Single    Spouse name: Not on file  . Number of children: Not on file  . Years of education: Not on file  . Highest education level: Not on file  Occupational History  . Not on file  Tobacco Use  . Smoking status: Never Smoker  . Smokeless tobacco: Never Used  Vaping Use  . Vaping Use: Never used  Substance and Sexual Activity  . Alcohol use: Yes    Comment: 5-6 every other day  . Drug use: Not Currently  . Sexual activity: Not on file  Other Topics Concern  . Not on file  Social History Narrative  . Not on file   Social Determinants of Health   Financial Resource Strain:   . Difficulty of Paying Living Expenses: Not on file  Food Insecurity:   . Worried About Charity fundraiser in the Last Year: Not on file  . Ran Out of Food in the Last Year: Not on file  Transportation Needs:   . Lack of Transportation (Medical): Not on file  . Lack of Transportation (Non-Medical): Not on file  Physical Activity:   . Days of Exercise per Week: Not on file  . Minutes of Exercise per Session: Not on file  Stress:   . Feeling of Stress : Not on file  Social Connections:   . Frequency of Communication with Friends and Family: Not on file  . Frequency of Social Gatherings with Friends and Family: Not on file  . Attends Religious Services: Not on file  . Active Member of Clubs or Organizations: Not on file  . Attends Archivist Meetings: Not on file  . Marital Status: Not on file   History reviewed. No pertinent family history. Scheduled Meds: . chlorhexidine gluconate (MEDLINE KIT)  15 mL Mouth Rinse BID  . Chlorhexidine Gluconate Cloth  6 each Topical Daily  . feeding supplement (PROSource TF)  90 mL Per Tube TID  . feeding supplement (VITAL HIGH PROTEIN)  1,000 mL Per Tube Q24H  . folic acid  1 mg Per Tube Daily  . lactulose   30 g Oral BID  . mouth rinse  15 mL Mouth Rinse 10 times per day  . metoprolol tartrate  12.5 mg Per Tube BID  . multivitamin with minerals  1 tablet Per Tube Daily  . pantoprazole (PROTONIX) IV  40 mg Intravenous Q12H  . QUEtiapine  50 mg Per Tube BID  . rifaximin  550 mg Oral BID  . sodium chloride flush  10-40 mL Intracatheter Q12H  . thiamine  100 mg Per Tube Daily   Or  . thiamine  100 mg Intravenous Daily   Continuous Infusions: . sodium chloride Stopped (01/27/20 1744)  . ceFEPime (MAXIPIME) IV Stopped (01/30/20 0540)  . dexmedetomidine (PRECEDEX) IV infusion    . fentaNYL infusion INTRAVENOUS 250 mcg/hr (01/30/20 0800)  . propofol (DIPRIVAN) infusion 30 mcg/kg/min (01/30/20 0955)   PRN Meds:.sodium chloride, acetaminophen **OR** acetaminophen, ondansetron **OR** ondansetron (ZOFRAN) IV, sodium chloride flush, traZODone No Known Allergies Review of Systems  Unable to perform ROS: Intubated  Physical Exam Constitutional:      Comments: Sedated, intubated   Musculoskeletal:     Right lower leg: Edema present.     Left lower leg: Edema present.  Skin:    General: Skin is warm and dry.     Vital Signs: BP (!) 102/58   Pulse 70   Temp (!) 100.6 F (38.1 C) (Axillary)   Resp 20   Ht $R'5\' 7"'Xk$  (1.702 m)   Wt 113.2 kg   SpO2 98%   BMI 39.09 kg/m  Pain Scale: CPOT   Pain Score: Asleep   SpO2: SpO2: 98 % O2 Device:SpO2: 98 % O2 Flow Rate: .O2 Flow Rate (L/min): 3 L/min  IO: Intake/output summary:   Intake/Output Summary (Last 24 hours) at 01/30/2020 1152 Last data filed at 01/30/2020 0800 Gross per 24 hour  Intake 1503.9 ml  Output 1670 ml  Net -166.1 ml    LBM: Last BM Date: 01/30/20 Baseline Weight: Weight: 108.9 kg Most recent weight: Weight: 113.2 kg     Palliative Assessment/Data: PPS 30%    Time Total: 90 minutes Greater than 50%  of this time was spent counseling and coordinating care related to the above assessment and plan.  Juel Burrow, DNP, AGNP-C Palliative Medicine Team [phone] Pager: [phone]

## 2020-01-30 NOTE — Progress Notes (Signed)
Cody Darby, MD [address]  [place]  [place], [place] [address]  Main: [phone]  Fax: [phone] Pager: [phone]   Subjective: Patient remains intubated and deeply sedated secondary to severe alcohol withdrawal.  No acute events overnight.  He is tolerating tube feeds well.  He is commenced on lactulose as well as rifaximin, having liquid brown bowel movements.  Good urine output.  Objective: Vital signs in last 24 hours: Vitals:   01/30/20 1000 01/30/20 1100 01/30/20 1200 01/30/20 1300  BP: 108/61 (!) 102/58 (!) 105/59 115/62  Pulse: 71 70 67 71  Resp: _0 Temp:   99.6 F (37.6 C)   TempSrc:   Axillary   SpO2: 99% 98% 99% 99%  Weight:      Height:       Weight change:   Intake/Output Summary (Last 24 hours) at 01/30/2020 1436 Last data filed at 01/30/2020 0800 Gross per 24 hour  Intake 1303.93 ml  Output 1350 ml  Net -46.07 ml     Exam: Heart:: Regular rate and rhythm, S1S2 present or without murmur or extra heart sounds Lungs: normal and clear to auscultation Abdomen: soft, nontender, normal bowel sounds   Lab Results: CBC Latest Ref Rng & Units 01/30/2020 01/30/2020 01/29/2020  WBC 4.0 - 10.5 K/uL - 8.2 -  Hemoglobin 13.0 - 17.0 g/dL 8.6(L) 8.8(L) 8.5(L)  Hematocrit 39 - 52 % 27.1(L) 26.6(L) 26.0(L)  Platelets 150 - 400 K/uL - 117(L) -   CMP Latest Ref Rng & Units 01/30/2020 01/29/2020 01/28/2020  Glucose 70 - 99 mg/dL 120(H) 93 112(H)  BUN 6 - 20 mg/dL 33(H) 33(H) 31(H)  Creatinine 0.61 - 1.24 mg/dL 0.86 0.46(L) 0.55(L)  Sodium 135 - 145 mmol/L 141 137 140  Potassium 3.5 - 5.1 mmol/L 4.1 4.1 4.1  Chloride 98 - 111 mmol/L 106 102 103  CO2 22 - 32 mmol/L _1 Calcium 8.9 - 10.3 mg/dL 7.6(L) 7.2(L) 7.8(L)  Total Protein 6.5 - 8.1 g/dL 5.9(L) 5.2(L) 5.9(L)  Total Bilirubin 0.3 - 1.2 mg/dL 12.6(H) 15.3(H) 19.7(HH)  Alkaline Phos 38 - 126 U/L 40 36(L) 39  AST 15 - 41 U/L 92(H) 117(H) 158(H)  ALT 0 - 44 U/L 38 43 58(H)    Micro  Results: Recent Results (from the past 240 hour(s))  SARS Coronavirus 2 by RT PCR (hospital order, performed in [facility] hospital lab) Nasopharyngeal Nasopharyngeal Swab     Status: None   Collection Time: 01/23/20  7:35 PM   Specimen: Nasopharyngeal Swab  Result Value Ref Range Status   SARS Coronavirus 2 NEGATIVE NEGATIVE Final    Comment: (NOTE) SARS-CoV-2 target nucleic acids are NOT DETECTED.  The SARS-CoV-2 RNA is generally detectable in upper and lower respiratory specimens during the acute phase of infection. The lowest concentration of SARS-CoV-2 viral copies this assay can detect is 250 copies / mL. A negative result does not preclude SARS-CoV-2 infection and should not be used as the sole basis for treatment or other patient management decisions.  A negative result may occur with improper specimen collection / handling, submission of specimen other than nasopharyngeal swab, presence of viral mutation(s) within the areas targeted by this assay, and inadequate number of viral copies (<250 copies / mL). A negative result must be combined with clinical observations, patient history, and epidemiological information.  Fact Sheet for Patients:   StrictlyIdeas.no  Fact Sheet for Healthcare Providers: BankingDealers.co.za  This test is not yet approved  or  cleared by the [place] and has been authorized for detection and/or diagnosis of SARS-CoV-2 by FDA under an Emergency Use Authorization (EUA).  This EUA will remain in effect (meaning this test can be used) for the duration of the COVID-19 declaration under Section 564(b)(1) of the Act, 21 U.S.C. section 360bbb-3(b)(1), unless the authorization is terminated or revoked sooner.  Performed at [facility], [address] [place], [place] [address]   CULTURE, BLOOD (ROUTINE X 2) w Reflex to ID Panel     Status: None   Collection Time: 01/24/20  4:15 PM    Specimen: BLOOD  Result Value Ref Range Status   Specimen Description BLOOD RIGHT ANTECUBITAL  Final   Special Requests   Final    BOTTLES DRAWN AEROBIC AND ANAEROBIC Blood Culture adequate volume   Culture   Final    NO GROWTH 5 DAYS Performed at [facility], [address] [place], [place] [address]    Report Status 01/29/2020 FINAL  Final  CULTURE, BLOOD (ROUTINE X 2) w Reflex to ID Panel     Status: None   Collection Time: 01/24/20  4:15 PM   Specimen: BLOOD  Result Value Ref Range Status   Specimen Description BLOOD LEFT ANTECUBITAL  Final   Special Requests   Final    BOTTLES DRAWN AEROBIC AND ANAEROBIC Blood Culture adequate volume   Culture   Final    NO GROWTH 5 DAYS Performed at [facility], [address] [place], [place] [address]    Report Status 01/29/2020 FINAL  Final  MRSA PCR Screening     Status: None   Collection Time: 01/28/20  2:19 PM   Specimen: Nasal Mucosa; Nasopharyngeal  Result Value Ref Range Status   MRSA by PCR NEGATIVE NEGATIVE Final    Comment:        The GeneXpert MRSA Assay (FDA approved for NASAL specimens only), is one component of a comprehensive MRSA colonization surveillance program. It is not intended to diagnose MRSA infection nor to guide or monitor treatment for MRSA infections. Performed at [facility] [address] [place], [place] [address]    Studies/Results: [place] Abd 1 View  Result Date: 01/28/2020 CLINICAL DATA:  Orogastric tube placement EXAM: ABDOMEN - 1 VIEW COMPARISON:  None. FINDINGS: A nasogastric tube is present with side port in the proximal stomach and distal port in the stomach body. Lower thoracic and upper lumbar spondylosis. Mild vascular accentuation in the lung bases. IMPRESSION: 1. Nasogastric tube is present with side port in the proximal stomach body. 2. Mild vascular accentuation in the lung bases. Electronically Signed   By: Van Clines M.D.   On: 01/28/2020 15:04     DG Chest Port 1 View  Result Date: 01/28/2020 CLINICAL DATA:  Endotracheal tube placement for respiratory failure. Acute toxic metabolic encephalopathy. Acute liver failure complicated by rhabdomyolysis. EXAM: PORTABLE CHEST 1 VIEW COMPARISON:  None. FINDINGS: Endotracheal tube tip is 2.7 cm above the carina, satisfactorily position. Nasogastric tube side port is in the vicinity of the gastric cardia with distal port in the stomach body. Mild enlargement of the cardiopericardial silhouette noted with indistinct pulmonary vasculature and cephalization of blood flow suspicious for pulmonary venous hypertension. Borderline appearance for interstitial edema. IMPRESSION: 1. Endotracheal tube tip is 2.7 cm above the carina, satisfactorily positioned. 2. Mild enlargement of the cardiopericardial silhouette with suspected pulmonary venous hypertension and borderline appearance for interstitial edema. Electronically Signed   By: Thayer Jew  Janeece Fitting M.D.   On: 01/28/2020 15:08   Medications:  I have reviewed the patient's current medications. Prior to Admission:  Medications Prior to Admission  Medication Sig Dispense Refill Last Dose  . atorvastatin (LIPITOR) 20 MG tablet Take 20 mg by mouth at bedtime.   Past Week at Unknown time  . doxycycline (VIBRAMYCIN) 100 MG capsule Take 100 mg by mouth 2 (two) times daily.   Past Week at Unknown time  . metoprolol succinate (TOPROL-XL) 25 MG 24 hr tablet Take 25 mg by mouth daily.   Past Week at Unknown time   Scheduled: . chlorhexidine gluconate (MEDLINE KIT)  15 mL Mouth Rinse BID  . Chlorhexidine Gluconate Cloth  6 each Topical Daily  . feeding supplement (PROSource TF)  90 mL Per Tube TID  . feeding supplement (VITAL HIGH PROTEIN)  1,000 mL Per Tube Q24H  . folic acid  1 mg Per Tube Daily  . lactulose  30 g Oral BID  . mouth rinse  15 mL Mouth Rinse 10 times per day  . metoprolol tartrate  12.5 mg Per Tube BID  . multivitamin with minerals  1 tablet Per  Tube Daily  . pantoprazole (PROTONIX) IV  40 mg Intravenous Q12H  . QUEtiapine  50 mg Per Tube BID  . rifaximin  550 mg Oral BID  . sodium chloride flush  10-40 mL Intracatheter Q12H  . thiamine  100 mg Per Tube Daily   Or  . thiamine  100 mg Intravenous Daily   Continuous: . sodium chloride 250 mL (01/30/20 1423)  . ceFEPime (MAXIPIME) IV 2 g (01/30/20 1425)  . dexmedetomidine (PRECEDEX) IV infusion    . fentaNYL infusion INTRAVENOUS 250 mcg/hr (01/30/20 0800)  . propofol (DIPRIVAN) infusion 30 mcg/kg/min (01/30/20 0955)   CNO:BSJGGE chloride, acetaminophen **OR** acetaminophen, ondansetron **OR** ondansetron (ZOFRAN) IV, sodium chloride flush, traZODone Anti-infectives (From admission, onward)   Start     Dose/Rate Route Frequency Ordered Stop   01/29/20 1530  rifaximin (XIFAXAN) tablet 550 mg        550 mg Oral 2 times daily 01/29/20 1500     01/28/20 1515  ceFEPIme (MAXIPIME) 2 g in sodium chloride 0.9 % 100 mL IVPB        2 g 200 mL/hr over 30 Minutes Intravenous Every 8 hours 01/28/20 1504     01/24/20 1600  cefTRIAXone (ROCEPHIN) 2 g in sodium chloride 0.9 % 100 mL IVPB  Status:  Discontinued        2 g 200 mL/hr over 30 Minutes Intravenous Every 24 hours 01/24/20 1520 01/28/20 1443   01/23/20 2045  cefTRIAXone (ROCEPHIN) 1 g in sodium chloride 0.9 % 100 mL IVPB        1 g 200 mL/hr over 30 Minutes Intravenous  Once 01/23/20 2042 01/23/20 2216     Scheduled Meds: . chlorhexidine gluconate (MEDLINE KIT)  15 mL Mouth Rinse BID  . Chlorhexidine Gluconate Cloth  6 each Topical Daily  . feeding supplement (PROSource TF)  90 mL Per Tube TID  . feeding supplement (VITAL HIGH PROTEIN)  1,000 mL Per Tube Q24H  . folic acid  1 mg Per Tube Daily  . lactulose  30 g Oral BID  . mouth rinse  15 mL Mouth Rinse 10 times per day  . metoprolol tartrate  12.5 mg Per Tube BID  . multivitamin with minerals  1 tablet Per Tube Daily  . pantoprazole (PROTONIX) IV  40 mg Intravenous Q12H  .  QUEtiapine  50 mg Per Tube BID  . rifaximin  550 mg Oral BID  . sodium chloride flush  10-40 mL Intracatheter Q12H  . thiamine  100 mg Per Tube Daily   Or  . thiamine  100 mg Intravenous Daily   Continuous Infusions: . sodium chloride 250 mL (01/30/20 1423)  . ceFEPime (MAXIPIME) IV 2 g (01/30/20 1425)  . dexmedetomidine (PRECEDEX) IV infusion    . fentaNYL infusion INTRAVENOUS 250 mcg/hr (01/30/20 0800)  . propofol (DIPRIVAN) infusion 30 mcg/kg/min (01/30/20 0955)   PRN Meds:.sodium chloride, acetaminophen **OR** acetaminophen, ondansetron **OR** ondansetron (ZOFRAN) IV, sodium chloride flush, traZODone   Assessment: Principal Problem:   Sepsis ([place]) Active Problems:   Acute blood loss anemia   GIB (gastrointestinal bleeding)   Transaminitis   Hyponatremia   Hypokalemia   Falls   Hip pain   Rhabdomyolysis   Hypovolemia   Hypotension   Alcohol abuse   Goals of care, counseling/discussion   Palliative care by specialist   Plan: Acute alcoholic hepatitis: LFTs are nicely improving Moderate discriminant function is 39, steroids have not been recommended due to possible infection, with one episode of fever, currently being treated for aspiration pneumonia No evidence of GI bleed Continue pantoprazole 40 mg IV twice daily Continue tube feeds and titrate to goal Nutrition is key to recover from alcoholic hepatitis Complete abstinence from alcohol use  Altered mental status s/p intubation Likely combination of alcohol withdrawal and hepatic encephalopathy Continue rifaximin 550 mg via OG tube twice daily Continue lactulose 30 g 1-2 times daily as needed and titrate to have loose/soft bowel movements not more than 2 per day  GI will sign off at this time, please call us back with questions or concerns    LOS: 7 days   Katheleen Stella 01/30/2020, 2:36 PM

## 2020-01-31 DIAGNOSIS — F1010 Alcohol abuse, uncomplicated: Secondary | ICD-10-CM

## 2020-01-31 DIAGNOSIS — K7291 Hepatic failure, unspecified with coma: Secondary | ICD-10-CM

## 2020-01-31 LAB — COMPREHENSIVE METABOLIC PANEL
ALT: 36 U/L (ref 0–44)
AST: 91 U/L — ABNORMAL HIGH (ref 15–41)
Albumin: 2.4 g/dL — ABNORMAL LOW (ref 3.5–5.0)
Alkaline Phosphatase: 47 U/L (ref 38–126)
Anion gap: 7 (ref 5–15)
BUN: 30 mg/dL — ABNORMAL HIGH (ref 6–20)
CO2: 27 mmol/L (ref 22–32)
Calcium: 7.7 mg/dL — ABNORMAL LOW (ref 8.9–10.3)
Chloride: 109 mmol/L (ref 98–111)
Creatinine, Ser: 0.74 mg/dL (ref 0.61–1.24)
GFR calc Af Amer: >60 mL/min (ref >60)
GFR calc non Af Amer: >60 mL/min (ref >60)
Glucose, Bld: 103 mg/dL — ABNORMAL HIGH (ref 70–99)
Potassium: 3.7 mmol/L (ref 3.5–5.1)
Sodium: 143 mmol/L (ref 135–145)
Total Bilirubin: 9.5 mg/dL — ABNORMAL HIGH (ref 0.3–1.2)
Total Protein: 5.8 g/dL — ABNORMAL LOW (ref 6.5–8.1)

## 2020-01-31 LAB — CBC WITH DIFFERENTIAL/PLATELET
Abs Immature Granulocytes: 0.19 10*3/uL — ABNORMAL HIGH (ref 0.00–0.07)
Basophils Absolute: 0.1 10*3/uL (ref 0.0–0.1)
Basophils Relative: 1 %
Eosinophils Absolute: 0.4 10*3/uL (ref 0.0–0.5)
Eosinophils Relative: 5 %
HCT: 27.8 % — ABNORMAL LOW (ref 39.0–52.0)
Hemoglobin: 8.8 g/dL — ABNORMAL LOW (ref 13.0–17.0)
Immature Granulocytes: 2 %
Lymphocytes Relative: 16 %
Lymphs Abs: 1.4 10*3/uL (ref 0.7–4.0)
MCH: 34.4 pg — ABNORMAL HIGH (ref 26.0–34.0)
MCHC: 31.7 g/dL (ref 30.0–36.0)
MCV: 108.6 fL — ABNORMAL HIGH (ref 80.0–100.0)
Monocytes Absolute: 1 10*3/uL (ref 0.1–1.0)
Monocytes Relative: 11 %
Neutro Abs: 5.7 10*3/uL (ref 1.7–7.7)
Neutrophils Relative %: 65 %
Platelets: 121 10*3/uL — ABNORMAL LOW (ref 150–400)
RBC: 2.56 MIL/uL — ABNORMAL LOW (ref 4.22–5.81)
RDW: 24.5 % — ABNORMAL HIGH (ref 11.5–15.5)
Smear Review: NORMAL
WBC: 8.8 10*3/uL (ref 4.0–10.5)
nRBC: 0.6 % — ABNORMAL HIGH (ref 0.0–0.2)

## 2020-01-31 LAB — GLUCOSE, CAPILLARY
Glucose-Capillary: 108 mg/dL — ABNORMAL HIGH (ref 70–99)
Glucose-Capillary: 110 mg/dL — ABNORMAL HIGH (ref 70–99)
Glucose-Capillary: 86 mg/dL (ref 70–99)
Glucose-Capillary: 88 mg/dL (ref 70–99)
Glucose-Capillary: 91 mg/dL (ref 70–99)
Glucose-Capillary: 93 mg/dL (ref 70–99)

## 2020-01-31 LAB — HEMOGLOBIN AND HEMATOCRIT, BLOOD
HCT: 27.1 % — ABNORMAL LOW (ref 39.0–52.0)
HCT: 26.6 % — ABNORMAL LOW (ref 39.0–52.0)
HCT: 29.2 % — ABNORMAL LOW (ref 39.0–52.0)
Hemoglobin: 9.1 g/dL — ABNORMAL LOW (ref 13.0–17.0)
Hemoglobin: 8.6 g/dL — ABNORMAL LOW (ref 13.0–17.0)
Hemoglobin: 9.1 g/dL — ABNORMAL LOW (ref 13.0–17.0)

## 2020-01-31 LAB — TRIGLYCERIDES: Triglycerides: 824 mg/dL — ABNORMAL HIGH (ref <150)

## 2020-01-31 LAB — PHOSPHORUS: Phosphorus: 3.4 mg/dL (ref 2.5–4.6)

## 2020-01-31 LAB — MAGNESIUM: Magnesium: 1.9 mg/dL (ref 1.7–2.4)

## 2020-01-31 MED ORDER — MIDAZOLAM BOLUS VIA INFUSION
1.0000 mg | INTRAVENOUS | Status: DC | PRN
  Filled 2020-01-31: qty 2

## 2020-01-31 MED ORDER — ROCURONIUM BROMIDE 50 MG/5ML IV SOLN
INTRAVENOUS | Status: AC
  Filled 2020-01-31: qty 1

## 2020-01-31 MED ORDER — NOREPINEPHRINE 4 MG/250ML-% IV SOLN: 2.0000 ug/min | INTRAVENOUS | Status: DC

## 2020-01-31 MED ORDER — DEXMEDETOMIDINE HCL IN NACL 400 MCG/100ML IV SOLN
0.4000 ug/kg/h | INTRAVENOUS | Status: DC
  Administered 2020-02-01: 0.4 ug/kg/h via INTRAVENOUS
  Filled 2020-01-31: qty 100

## 2020-01-31 MED ORDER — SODIUM CHLORIDE 0.9 % IV SOLN: 250.0000 mL | INTRAVENOUS | Status: DC

## 2020-01-31 MED ORDER — FENTANYL BOLUS VIA INFUSION
50.0000 ug | INTRAVENOUS | Status: DC | PRN
  Filled 2020-01-31: qty 50

## 2020-01-31 MED ORDER — MIDAZOLAM 50MG/50ML (1MG/ML) PREMIX INFUSION
0.5000 mg/h | INTRAVENOUS | Status: DC
  Administered 2020-01-31: 0.5 mg/h via INTRAVENOUS
  Administered 2020-01-31 – 2020-02-01 (×2): 10 mg/h via INTRAVENOUS
  Filled 2020-01-31 (×3): qty 50

## 2020-01-31 NOTE — Progress Notes (Signed)
CH visited pt.'s rm. while rounding on ICU; pt. intubated w/girlfriend Lucendia Herrlich @ bedside in chair.  Lucendia Herrlich says she is doing OK and does not need any support at this time; [facility] remains available as needed.

## 2020-01-31 NOTE — Progress Notes (Signed)
Daily Progress Note   Patient Name: [name]       Date: 01/31/2020 DOB: 1970-05-30  Age: 49 y.o. MRN#: [id number] Attending Physician: Cody Lipps, MD Primary Care Physician: Cody Haggard, FNP Admit Date: 01/23/2020  Reason for Consultation/Follow-up: Establishing goals of care  Subjective: Sedated and intubated - woke up and followed commands for RN during wake up assessment but required sedation d/t agitation/tremors. Fiance [name] at bedside.   Length of Stay: 8  Current Medications: Scheduled Meds:  . chlorhexidine gluconate (MEDLINE KIT)  15 mL Mouth Rinse BID  . Chlorhexidine Gluconate Cloth  6 each Topical Daily  . feeding supplement (PROSource TF)  90 mL Per Tube TID  . feeding supplement (VITAL HIGH PROTEIN)  1,000 mL Per Tube Q24H  . folic acid  1 mg Per Tube Daily  . mouth rinse  15 mL Mouth Rinse 10 times per day  . metoprolol tartrate  12.5 mg Per Tube BID  . multivitamin with minerals  1 tablet Per Tube Daily  . pantoprazole (PROTONIX) IV  40 mg Intravenous Q12H  . QUEtiapine  50 mg Per Tube BID  . rifaximin  550 mg Oral BID  . sodium chloride flush  10-40 mL Intracatheter Q12H  . thiamine  100 mg Per Tube Daily   Or  . thiamine  100 mg Intravenous Daily    Continuous Infusions: . sodium chloride 5 mL/hr at 01/31/20 1100  . ceFEPime (MAXIPIME) IV Stopped (01/31/20 0553)  . dexmedetomidine (PRECEDEX) IV infusion    . fentaNYL infusion INTRAVENOUS 350 mcg/hr (01/31/20 1100)  . propofol (DIPRIVAN) infusion 40 mcg/kg/min (01/31/20 1100)    PRN Meds: sodium chloride, acetaminophen **OR** acetaminophen, lactulose, ondansetron **OR** ondansetron (ZOFRAN) IV, sodium chloride flush, traZODone  Physical Exam Constitutional:      Comments: Sedated, intubated    Musculoskeletal:     Right lower leg: Edema present.     Left lower leg: Edema present.  Skin:    General: Skin is warm and dry.             Vital Signs: BP 128/62   Pulse 99   Temp 99.3 F (37.4 C) (Oral)   Resp 19   Ht $R'5\' 7"'Pq$  (1.702 m)   Wt 116.3 kg   SpO2 95%   BMI 40.16 kg/m  SpO2: SpO2: 95 % O2 Device: O2 Device: Ventilator O2 Flow Rate: O2 Flow Rate (L/min): 3 L/min  Intake/output summary:   Intake/Output Summary (Last 24 hours) at 01/31/2020 1237 Last data filed at 01/31/2020 1100 Gross per 24 hour  Intake 1777.21 ml  Output 1280 ml  Net 497.21 ml   LBM: Last BM Date: 01/31/20 Baseline Weight: Weight: 108.9 kg Most recent weight: Weight: 116.3 kg       Palliative Assessment/Data: PPS 30%    Flowsheet Rows     Most Recent Value  Intake Tab  Referral Department Critical care  Unit at Time of Referral ICU  Palliative Care Primary Diagnosis Sepsis/Infectious Disease  Date Notified 01/28/20  Palliative Care Type New Palliative care  Reason for referral Clarify Goals of Care  Date of Admission 01/28/20  Date first seen by Palliative Care 01/30/20  #  of days Palliative referral response time 2 Day(s)  # of days IP prior to Palliative referral 0  Clinical Assessment  Palliative Performance Scale Score 30%  Psychosocial & Spiritual Assessment  Palliative Care Outcomes  Patient/Family meeting held? Yes  Who was at the meeting? brother  Palliative Care Outcomes Clarified goals of care, Provided psychosocial or spiritual support      Patient Active Problem List   Diagnosis Date Noted  . Alcohol abuse   . Goals of care, counseling/discussion   . Palliative care by specialist   . Rhabdomyolysis 01/25/2020  . Sepsis ([place]) 01/25/2020  . Hypovolemia 01/25/2020  . Hypotension 01/25/2020  . GIB (gastrointestinal bleeding) 01/24/2020  . Transaminitis 01/24/2020  . Hyponatremia 01/24/2020  . Hypokalemia 01/24/2020  . Falls 01/24/2020  . Hip pain 01/24/2020   . Acute blood loss anemia 01/23/2020  . Substance induced mood disorder ([place]) 05/04/2017    Palliative Care Assessment & Plan   HPI: 49 y.o. male  with past medical history of ETOH abuse admitted on 01/23/2020 with generalized weakness and a fall. Diagnosed with acute alcoholic hepatitis. Is also being treated for aspiration pna. Probable hepatic encephalopathy. Also with acute diastolic heart failure. PMT consulted to discuss [name].   Assessment: Discussed case with RN and Dr. Mortimer [name].  Spoke with patient's fiance at bedside - she too shares same as brother shared yesterday that [name] has been declining for 1-2 months - less active, weak, poor intake. She attributes this to substance abuse and stressful job.  We reviewed [name]'s illness - discussed multiorgan failure. Discussed expected that [name] will have prolonged stay in ICU/hospital. Fiance expresses understanding.   Also spoke with brother, [name], who is Scientist, research (medical). Update provided. Also discussed expected prolonged stay in hospital/ICU. [name] expresses understanding. [name] is agreeable to ongoing support from PMT.  Recommendations/Plan: - full code/full scope - family aware of severity of illness/expected prolonged hospital stay - PMT continue discussions with family for [name]  Code Status:  Full code  Prognosis:   Unable to determine  Discharge Planning:  To Be Determined  Care plan was discussed with Dr. Mortimer Fries, RN, family  Thank you for allowing the Palliative Medicine Team to assist in the care of this patient.   Total Time 40 minutes Prolonged Time Billed  no       Greater than 50%  of this time was spent counseling and coordinating care related to the above assessment and plan.  Cody Burrow, DNP, [facility] Palliative Medicine Team Team Phone # [phone]  Pager [phone]

## 2020-01-31 NOTE — Progress Notes (Signed)
CRITICAL CARE NOTE 37 malewith a known history of polysubstance abuse including alcohol abuse +ETOH ABUSE lost balance and fell with subsequent right hip pain he usually drinks 5-6 beer at night.   EDCOURSE: COVID-19 PCR negative.  Alcohol level was 296.Marland Kitchen  Stool Hemoccult was weakly positive.   DX of LIVER FAILURE GI CONSULTED  9/6 transferred to ICU for increased WOB, using accessory muscles to breathe with liver failure 9/7 remains on vent, liver failure, PICC line placed, brother updated 9/8 remains encephalopathic, liver failure, resp failure, sister updated 9/9 remains with multiorgan failure, +DT's  CC  follow up respiratory failure  HPI Patient remains critically ill Prognosis is guarded Remains on vent   CBC    Component Value Date/Time   WBC 8.8 01/31/2020 0355   RBC 2.56 (L) 01/31/2020 0355   HGB 8.8 (L) 01/31/2020 0355   HCT 27.8 (L) 01/31/2020 0355   PLT 121 (L) 01/31/2020 0355   MCV 108.6 (H) 01/31/2020 0355   MCH 34.4 (H) 01/31/2020 0355   MCHC 31.7 01/31/2020 0355   RDW 24.5 (H) 01/31/2020 0355   LYMPHSABS 1.4 01/31/2020 0355   MONOABS 1.0 01/31/2020 0355   EOSABS 0.4 01/31/2020 0355   BASOSABS 0.1 01/31/2020 0355   BMP Latest Ref Rng & Units 01/31/2020 01/30/2020 01/29/2020  Glucose 70 - 99 mg/dL 540(J) 811(B) 93  BUN 6 - 20 mg/dL 14(N) 82(N) 56(O)  Creatinine 0.61 - 1.24 mg/dL 1.30 8.65 7.84(O)  Sodium 135 - 145 mmol/L 143 141 137  Potassium 3.5 - 5.1 mmol/L 3.7 4.1 4.1  Chloride 98 - 111 mmol/L 109 106 102  CO2 22 - 32 mmol/L 27 27 26   Calcium 8.9 - 10.3 mg/dL 7.7(L) 7.6(L) 7.2(L)      BP (!) 105/51   Pulse 82   Temp 99.2 F (37.3 C) (Axillary)   Resp 20   Ht 5\' 7"  (1.702 m)   Wt 116.3 kg   SpO2 99%   BMI 40.16 kg/m    I/O last 3 completed shifts: In: 2037.2 [I.V.:1715.8; IV Piggyback:321.4] Out: 2705 [Urine:1805; Stool:900] No intake/output data recorded.  SpO2: 99 % O2 Flow Rate (L/min): 3 L/min FiO2 (%): 35  %  Estimated body mass index is 40.16 kg/m as calculated from the following:   Height as of this encounter: 5\' 7"  (1.702 m).   Weight as of this encounter: 116.3 kg.  SIGNIFICANT EVENTS   REVIEW OF SYSTEMS  PATIENT IS UNABLE TO PROVIDE COMPLETE REVIEW OF SYSTEMS DUE TO SEVERE CRITICAL ILLNESS        PHYSICAL EXAMINATION:  GENERAL:critically ill appearing, +resp distress HEAD: Normocephalic, atraumatic.  EYES: Pupils equal, round, reactive to light.  No scleral icterus.  MOUTH: Moist mucosal membrane. NECK: Supple.  PULMONARY: +rhonchi, +wheezing CARDIOVASCULAR: S1 and S2. Regular rate and rhythm. No murmurs, rubs, or gallops.  GASTROINTESTINAL: Soft, nontender, -distended.  Positive bowel sounds.   MUSCULOSKELETAL: No swelling, clubbing, or edema.  NEUROLOGIC: obtunded, GCS<8 SKIN:intact,warm,dry  MEDICATIONS: I have reviewed all medications and confirmed regimen as documented   CULTURE RESULTS   Recent Results (from the past 240 hour(s))  SARS Coronavirus 2 by RT PCR (hospital order, performed in [facility] hospital lab) Nasopharyngeal Nasopharyngeal Swab     Status: None   Collection Time: 01/23/20  7:35 PM   Specimen: Nasopharyngeal Swab  Result Value Ref Range Status   SARS Coronavirus 2 NEGATIVE NEGATIVE Final    Comment: (NOTE) SARS-CoV-2 target nucleic acids are NOT DETECTED.  The SARS-CoV-2 RNA  is generally detectable in upper and lower respiratory specimens during the acute phase of infection. The lowest concentration of SARS-CoV-2 viral copies this assay can detect is 250 copies / mL. A negative result does not preclude SARS-CoV-2 infection and should not be used as the sole basis for treatment or other patient management decisions.  A negative result may occur with improper specimen collection / handling, submission of specimen other than nasopharyngeal swab, presence of viral mutation(s) within the areas targeted by this assay, and inadequate number  of viral copies (<250 copies / mL). A negative result must be combined with clinical observations, patient history, and epidemiological information.  Fact Sheet for Patients:   BoilerBrush.com.cy  Fact Sheet for Healthcare Providers: https://pope.com/  This test is not yet approved or  cleared by the [place] FDA and has been authorized for detection and/or diagnosis of SARS-CoV-2 by FDA under an Emergency Use Authorization (EUA).  This EUA will remain in effect (meaning this test can be used) for the duration of the COVID-19 declaration under Section 564(b)(1) of the Act, 21 U.S.C. section 360bbb-3(b)(1), unless the authorization is terminated or revoked sooner.  Performed at [facility], [address] [place], [place] [address]   CULTURE, BLOOD (ROUTINE X 2) w Reflex to ID Panel     Status: None   Collection Time: 01/24/20  4:15 PM   Specimen: BLOOD  Result Value Ref Range Status   Specimen Description BLOOD RIGHT ANTECUBITAL  Final   Special Requests   Final    BOTTLES DRAWN AEROBIC AND ANAEROBIC Blood Culture adequate volume   Culture   Final    NO GROWTH 5 DAYS Performed at [facility], [address] [place], [place] [address]    Report Status 01/29/2020 FINAL  Final  CULTURE, BLOOD (ROUTINE X 2) w Reflex to ID Panel     Status: None   Collection Time: 01/24/20  4:15 PM   Specimen: BLOOD  Result Value Ref Range Status   Specimen Description BLOOD LEFT ANTECUBITAL  Final   Special Requests   Final    BOTTLES DRAWN AEROBIC AND ANAEROBIC Blood Culture adequate volume   Culture   Final    NO GROWTH 5 DAYS Performed at [facility], [address] [place], [place] [address]    Report Status 01/29/2020 FINAL  Final  MRSA PCR Screening     Status: None   Collection Time: 01/28/20  2:19 PM   Specimen: Nasal Mucosa; Nasopharyngeal  Result Value Ref Range Status   MRSA by PCR NEGATIVE  NEGATIVE Final    Comment:        The GeneXpert MRSA Assay (FDA approved for NASAL specimens only), is one component of a comprehensive MRSA colonization surveillance program. It is not intended to diagnose MRSA infection nor to guide or monitor treatment for MRSA infections. Performed at [facility], [address], [place], [place] [address]           IMAGING    No results found.   Nutrition Status: Nutrition Problem: Inadequate oral intake Etiology: inability to eat Signs/Symptoms: NPO status Interventions: Tube feeding, Prostat, MVI     Indwelling Urinary Catheter continued, requirement due to   Reason to continue Indwelling Urinary Catheter strict Intake/Output monitoring for hemodynamic instability   Central Line/ continued, requirement due to  Reason to continue [employer] Monitoring of central venous pressure or other hemodynamic parameters and poor IV access   Ventilator continued, requirement due to  severe respiratory failure   Ventilator Sedation RASS 0 to -2      ASSESSMENT AND PLAN SYNOPSIS Severe ACUTE Hypoxic and Hypercapnic Respiratory Failure due to multiorgan failure with liver failure, resp failure and aspiration pneumonia with severe DT's   Severe ACUTE Hypoxic and Hypercapnic Respiratory Failure -continue Full MV support -continue Bronchodilator Therapy -Wean Fio2 and PEEP as tolerated -VAP/VENT bundle implementation Unable to wean due to DT's and resp failure  ACUTE DIASTOLIC CARDIAC FAILURE- -oxygen as needed -Lasix as tolerated  Morbid obesity, possible OSA.   Will certainly impact respiratory mechanics, ventilator weaning Suspect will need to consider additional PEEP    NEUROLOGY Acute toxic metabolic encephalopathy, need for sedation Goal RASS -2 to -3  CARDIAC ICU monitoring  ID aspiration pneumonia -continue IV abx as prescibed -follow up cultures  GI GI PROPHYLAXIS as indicated   DIET-->TF's  as tolerated Constipation protocol as indicated  ENDO - will use ICU hypoglycemic\Hyperglycemia protocol if indicated     ELECTROLYTES -follow labs as needed -replace as needed -pharmacy consultation and following   DVT/GI PRX ordered and assessed TRANSFUSIONS AS NEEDED MONITOR FSBS I Assessed the need for Labs I Assessed the need for Foley I Assessed the need for Central Venous Line Family Discussion when available I Assessed the need for Mobilization I made an Assessment of medications to be adjusted accordingly Safety Risk assessment completed   CASE DISCUSSED IN MULTIDISCIPLINARY ROUNDS WITH ICU TEAM  Critical Care Time devoted to patient care services described in this note is 34 minutes.   Overall, patient is critically ill, prognosis is guarded.  Patient with Multiorgan failure and at high risk for cardiac arrest and death.   I anticipate prolonged ICU LOS  Lucie Leather, M.D.  [facility]  Medical Director [facility] Medical Director [facility] Cardio-Pulmonary Department

## 2020-02-01 ENCOUNTER — Inpatient Hospital Stay: Payer: Self-pay

## 2020-02-01 LAB — CBC WITH DIFFERENTIAL/PLATELET
Abs Immature Granulocytes: 0.21 10*3/uL — ABNORMAL HIGH (ref 0.00–0.07)
Basophils Absolute: 0.1 10*3/uL (ref 0.0–0.1)
Basophils Relative: 1 %
Eosinophils Absolute: 0.4 10*3/uL (ref 0.0–0.5)
Eosinophils Relative: 4 %
HCT: 27.4 % — ABNORMAL LOW (ref 39.0–52.0)
Hemoglobin: 8.9 g/dL — ABNORMAL LOW (ref 13.0–17.0)
Immature Granulocytes: 2 %
Lymphocytes Relative: 13 %
Lymphs Abs: 1.3 10*3/uL (ref 0.7–4.0)
MCH: 34.4 pg — ABNORMAL HIGH (ref 26.0–34.0)
MCHC: 32.5 g/dL (ref 30.0–36.0)
MCV: 105.8 fL — ABNORMAL HIGH (ref 80.0–100.0)
Monocytes Absolute: 1.2 10*3/uL — ABNORMAL HIGH (ref 0.1–1.0)
Monocytes Relative: 12 %
Neutro Abs: 6.9 10*3/uL (ref 1.7–7.7)
Neutrophils Relative %: 68 %
Platelets: 136 10*3/uL — ABNORMAL LOW (ref 150–400)
RBC: 2.59 MIL/uL — ABNORMAL LOW (ref 4.22–5.81)
RDW: 24.1 % — ABNORMAL HIGH (ref 11.5–15.5)
Smear Review: NORMAL
WBC: 10.1 10*3/uL (ref 4.0–10.5)
nRBC: 0.4 % — ABNORMAL HIGH (ref 0.0–0.2)

## 2020-02-01 LAB — FIBRINOGEN: Fibrinogen: 368 mg/dL (ref 210–475)

## 2020-02-01 LAB — PROTIME-INR
INR: 1.6 — ABNORMAL HIGH (ref 0.8–1.2)
Prothrombin Time: 18.5 seconds — ABNORMAL HIGH (ref 11.4–15.2)

## 2020-02-01 LAB — PHOSPHORUS: Phosphorus: 3.2 mg/dL (ref 2.5–4.6)

## 2020-02-01 LAB — COMPREHENSIVE METABOLIC PANEL
ALT: 46 U/L — ABNORMAL HIGH (ref 0–44)
AST: 118 U/L — ABNORMAL HIGH (ref 15–41)
Albumin: 2.3 g/dL — ABNORMAL LOW (ref 3.5–5.0)
Alkaline Phosphatase: 62 U/L (ref 38–126)
Anion gap: 8 (ref 5–15)
BUN: 37 mg/dL — ABNORMAL HIGH (ref 6–20)
CO2: 28 mmol/L (ref 22–32)
Calcium: 7.6 mg/dL — ABNORMAL LOW (ref 8.9–10.3)
Chloride: 109 mmol/L (ref 98–111)
Creatinine, Ser: 0.94 mg/dL (ref 0.61–1.24)
GFR calc Af Amer: >60 mL/min (ref >60)
GFR calc non Af Amer: >60 mL/min (ref >60)
Glucose, Bld: 99 mg/dL (ref 70–99)
Potassium: 4.2 mmol/L (ref 3.5–5.1)
Sodium: 145 mmol/L (ref 135–145)
Total Bilirubin: 10.1 mg/dL — ABNORMAL HIGH (ref 0.3–1.2)
Total Protein: 5.9 g/dL — ABNORMAL LOW (ref 6.5–8.1)

## 2020-02-01 LAB — GLUCOSE, CAPILLARY
Glucose-Capillary: 102 mg/dL — ABNORMAL HIGH (ref 70–99)
Glucose-Capillary: 102 mg/dL — ABNORMAL HIGH (ref 70–99)
Glucose-Capillary: 88 mg/dL (ref 70–99)
Glucose-Capillary: 92 mg/dL (ref 70–99)
Glucose-Capillary: 95 mg/dL (ref 70–99)
Glucose-Capillary: 97 mg/dL (ref 70–99)

## 2020-02-01 LAB — HEMOGLOBIN AND HEMATOCRIT, BLOOD
HCT: 28.5 % — ABNORMAL LOW (ref 39.0–52.0)
HCT: 22.8 % — ABNORMAL LOW (ref 39.0–52.0)
HCT: 28.9 % — ABNORMAL LOW (ref 39.0–52.0)
HCT: 30.2 % — ABNORMAL LOW (ref 39.0–52.0)
Hemoglobin: 8.7 g/dL — ABNORMAL LOW (ref 13.0–17.0)
Hemoglobin: 7.2 g/dL — ABNORMAL LOW (ref 13.0–17.0)
Hemoglobin: 9.3 g/dL — ABNORMAL LOW (ref 13.0–17.0)
Hemoglobin: 9.4 g/dL — ABNORMAL LOW (ref 13.0–17.0)

## 2020-02-01 LAB — APTT: aPTT: 38 seconds — ABNORMAL HIGH (ref 24–36)

## 2020-02-01 LAB — PREPARE RBC (CROSSMATCH)

## 2020-02-01 LAB — MAGNESIUM: Magnesium: 1.8 mg/dL (ref 1.7–2.4)

## 2020-02-01 MED ORDER — SODIUM CHLORIDE 0.9 % IV SOLN: 250.0000 mL | INTRAVENOUS | Status: DC

## 2020-02-01 MED ORDER — DEXMEDETOMIDINE HCL IN NACL 400 MCG/100ML IV SOLN
0.4000 ug/kg/h | INTRAVENOUS | Status: DC
  Administered 2020-02-01 (×3): 0.4 ug/kg/h via INTRAVENOUS
  Administered 2020-02-02: 0.8 ug/kg/h via INTRAVENOUS
  Administered 2020-02-02: 1.2 ug/kg/h via INTRAVENOUS
  Administered 2020-02-02: 1 ug/kg/h via INTRAVENOUS
  Administered 2020-02-02: 0.4 ug/kg/h via INTRAVENOUS
  Administered 2020-02-03 (×2): 1 ug/kg/h via INTRAVENOUS
  Administered 2020-02-03: 0.4 ug/kg/h via INTRAVENOUS
  Administered 2020-02-04: 0.7 ug/kg/h via INTRAVENOUS
  Administered 2020-02-04: 0.4 ug/kg/h via INTRAVENOUS
  Filled 2020-02-01 (×15): qty 100

## 2020-02-01 MED ORDER — HEPARIN SODIUM (PORCINE) 5000 UNIT/ML IJ SOLN
5000.0000 [IU] | Freq: Three times a day (TID) | INTRAMUSCULAR | Status: DC
  Administered 2020-02-01 – 2020-02-14 (×35): 5000 [IU] via SUBCUTANEOUS
  Filled 2020-02-01 (×38): qty 1

## 2020-02-01 MED ORDER — THIAMINE HCL 100 MG/ML IJ SOLN
500.0000 mg | Freq: Every day | INTRAVENOUS | Status: AC
  Administered 2020-02-02 – 2020-02-06 (×5): 500 mg via INTRAVENOUS
  Filled 2020-02-01 (×5): qty 5

## 2020-02-01 MED ORDER — ALBUMIN HUMAN 25 % IV SOLN
12.5000 g | Freq: Once | INTRAVENOUS | Status: AC
  Administered 2020-02-01: 12.5 g via INTRAVENOUS
  Filled 2020-02-01: qty 50

## 2020-02-01 MED ORDER — NOREPINEPHRINE 4 MG/250ML-% IV SOLN: 2.0000 ug/min | INTRAVENOUS | Status: DC

## 2020-02-01 MED ORDER — SODIUM CHLORIDE 0.9% IV SOLUTION: Freq: Once | INTRAVENOUS | Status: AC

## 2020-02-01 MED ORDER — VITAL HIGH PROTEIN PO LIQD
1000.0000 mL | ORAL | Status: DC
  Administered 2020-02-01 (×2): 1000 mL

## 2020-02-01 MED ORDER — SODIUM CHLORIDE 0.9 % IV SOLN
25.0000 ug/min | INTRAVENOUS | Status: DC
  Administered 2020-02-01: 40 ug/min via INTRAVENOUS
  Administered 2020-02-01: 25 ug/min via INTRAVENOUS
  Administered 2020-02-01: 13.333 ug/min via INTRAVENOUS
  Administered 2020-02-02: 25 ug/min via INTRAVENOUS
  Administered 2020-02-02: 15 ug/min via INTRAVENOUS
  Administered 2020-02-02: 35 ug/min via INTRAVENOUS
  Filled 2020-02-01 (×4): qty 10
  Filled 2020-02-01 (×2): qty 1
  Filled 2020-02-01: qty 10

## 2020-02-01 MED ORDER — LACTATED RINGERS IV BOLUS
1000.0000 mL | Freq: Once | INTRAVENOUS | Status: AC
  Administered 2020-02-01: 1000 mL via INTRAVENOUS

## 2020-02-01 NOTE — Progress Notes (Signed)
Follow up - Critical Care Medicine Note  Patient Details:    [name] is an 49 y.o. male with a known history of polysubstance abuse including alcohol abuse who presented to the emergency room with acute onset of increased generalized weakness and amechanicalfall. Alcohol level 296 on admission.  Patient was transferred to ICU on 6 September due to increased work of breathing and accessory muscle use.  He was withdrawing from alcohol required intubation and mechanical ventilation.  Lines, Airways, Drains: Airway 8 mm (Active)  Secured at (cm) 24 cm 02/01/20 1555  Measured From Lips 02/01/20 [place] 02/01/20 1555  Secured By [employer] 02/01/20 1555  Tube Holder Repositioned Yes 02/01/20 1413  Cuff Pressure (cm H2O) 28 cm H2O 02/01/20 1413  Site Condition Dry 02/01/20 1555     PICC Triple Lumen 01/28/20 PICC Right Cephalic 39 cm 0 cm (Active)  Indication for Insertion or Continuance of Line Prolonged intravenous therapies 02/01/20 0800  Exposed Catheter (cm) 0 cm 01/28/20 1817  Site Assessment Clean;Dry;Intact 02/01/20 0800  Lumen #1 Status Infusing 02/01/20 0800  Lumen #2 Status Infusing 01/31/20 1930  Lumen #3 Status Flushed;Saline locked 02/01/20 0800  Dressing Type Transparent;Occlusive 02/01/20 0800  Dressing Status Clean;Dry;Intact;Antimicrobial disc in place 02/01/20 0800  Safety Lock Not Applicable 01/05/47 1856  Line Care Connections checked and tightened 02/01/20 0800  Line Adjustment (NICU/IV Team Only) No 01/28/20 1817  Dressing Intervention New dressing 01/28/20 1817  Dressing Change Due 02/04/20 02/01/20 0800     NG/OG Tube Center mouth Xray Documented cm marking at nare/ corner of mouth 52 cm (Active)  Cm Marking at Nare/Corner of Mouth (if applicable) 52 cm 31/49/70 1555  Site Assessment Clean;Dry;Intact 02/01/20 1555  Ongoing Placement Verification No change in cm markings or external length of tube from initial placement  02/01/20 1555  Status Infusing tube feed 02/01/20 1555  Amount of suction 80 mmHg 01/31/20 1930  Drainage Appearance Bile;Green 01/31/20 1930  Intake (mL) 150 mL 01/29/20 1700     Rectal Tube/Pouch (Active)  Output (mL) 200 mL 01/30/20 2000     Urethral Catheter [name] Latex (Active)  Indication for Insertion or Continuance of Catheter Therapy based on hourly urine output monitoring and documentation for critical condition (NOT STRICT I&O) 02/01/20 0805  Site Assessment Clean;Intact;Dry 02/01/20 0805  Catheter Maintenance Bag below level of bladder;Catheter secured;Drainage bag/tubing not touching floor;Insertion date on drainage bag;No dependent loops;Seal intact 02/01/20 0805  Collection Container Standard drainage bag 02/01/20 0805  Securement Method Securing device (Describe) 02/01/20 0805  Urinary Catheter Interventions (if applicable) Unclamped 26/37/85 0805  Output (mL) 150 mL 02/01/20 1400    Anti-infectives:  Anti-infectives (From admission, onward)   Start     Dose/Rate Route Frequency Ordered Stop   01/29/20 1530  rifaximin (XIFAXAN) tablet 550 mg        550 mg Oral 2 times daily 01/29/20 1500     01/28/20 1515  ceFEPIme (MAXIPIME) 2 g in sodium chloride 0.9 % 100 mL IVPB        2 g 200 mL/hr over 30 Minutes Intravenous Every 8 hours 01/28/20 1504 02/04/20 1359   01/24/20 1600  cefTRIAXone (ROCEPHIN) 2 g in sodium chloride 0.9 % 100 mL IVPB  Status:  Discontinued        2 g 200 mL/hr over 30 Minutes Intravenous Every 24 hours 01/24/20 1520 01/28/20 1443   01/23/20 2045  cefTRIAXone (ROCEPHIN) 1 g in sodium chloride 0.9 % 100  mL IVPB        1 g 200 mL/hr over 30 Minutes Intravenous  Once 01/23/20 2042 01/23/20 2216     Scheduled Meds: . chlorhexidine gluconate (MEDLINE KIT)  15 mL Mouth Rinse BID  . Chlorhexidine Gluconate Cloth  6 each Topical Daily  . folic acid  1 mg Per Tube Daily  . mouth rinse  15 mL Mouth Rinse 10 times per day  . metoprolol tartrate   12.5 mg Per Tube BID  . multivitamin with minerals  1 tablet Per Tube Daily  . pantoprazole (PROTONIX) IV  40 mg Intravenous Q12H  . QUEtiapine  50 mg Per Tube BID  . rifaximin  550 mg Oral BID  . sodium chloride flush  10-40 mL Intracatheter Q12H  . thiamine  100 mg Per Tube Daily   Or  . thiamine  100 mg Intravenous Daily   Continuous Infusions: . sodium chloride 5 mL/hr at 02/01/20 0200  . sodium chloride    . sodium chloride    . sodium chloride    . sodium chloride    . ceFEPime (MAXIPIME) IV 2 g (02/01/20 1546)  . dexmedetomidine (PRECEDEX) IV infusion 0.4 mcg/kg/hr (02/01/20 1546)  . feeding supplement (VITAL HIGH PROTEIN) 1,000 mL (02/01/20 1517)  . fentaNYL infusion INTRAVENOUS 300 mcg/hr (02/01/20 1546)  . midazolam Stopped (02/01/20 1039)  . phenylephrine (NEO-SYNEPHRINE) Adult infusion 35 mcg/min (02/01/20 1546)   PRN Meds:.sodium chloride, acetaminophen **OR** acetaminophen, fentaNYL, lactulose, midazolam, ondansetron **OR** ondansetron (ZOFRAN) IV, sodium chloride flush, traZODone   Microbiology: Results for orders placed or performed during the hospital encounter of 01/23/20  SARS Coronavirus 2 by RT PCR (hospital order, performed in [facility] hospital lab) Nasopharyngeal Nasopharyngeal Swab     Status: None   Collection Time: 01/23/20  7:35 PM   Specimen: Nasopharyngeal Swab  Result Value Ref Range Status   SARS Coronavirus 2 NEGATIVE NEGATIVE Final    Comment: (NOTE) SARS-CoV-2 target nucleic acids are NOT DETECTED.  The SARS-CoV-2 RNA is generally detectable in upper and lower respiratory specimens during the acute phase of infection. The lowest concentration of SARS-CoV-2 viral copies this assay can detect is 250 copies / mL. A negative result does not preclude SARS-CoV-2 infection and should not be used as the sole basis for treatment or other patient management decisions.  A negative result may occur with improper specimen collection / handling,  submission of specimen other than nasopharyngeal swab, presence of viral mutation(s) within the areas targeted by this assay, and inadequate number of viral copies (<250 copies / mL). A negative result must be combined with clinical observations, patient history, and epidemiological information.  Fact Sheet for Patients:   StrictlyIdeas.no  Fact Sheet for Healthcare Providers: BankingDealers.co.za  This test is not yet approved or  cleared by the [place] FDA and has been authorized for detection and/or diagnosis of SARS-CoV-2 by FDA under an Emergency Use Authorization (EUA).  This EUA will remain in effect (meaning this test can be used) for the duration of the COVID-19 declaration under Section 564(b)(1) of the Act, 21 U.S.C. section 360bbb-3(b)(1), unless the authorization is terminated or revoked sooner.  Performed at [facility], [address] [place], [place] [address]   CULTURE, BLOOD (ROUTINE X 2) w Reflex to ID Panel     Status: None   Collection Time: 01/24/20  4:15 PM   Specimen: BLOOD  Result Value Ref Range Status   Specimen Description BLOOD RIGHT ANTECUBITAL  Final  Special Requests   Final    BOTTLES DRAWN AEROBIC AND ANAEROBIC Blood Culture adequate volume   Culture   Final    NO GROWTH 5 DAYS Performed at [facility], [address] [place], [place] [address]    Report Status 01/29/2020 FINAL  Final  CULTURE, BLOOD (ROUTINE X 2) w Reflex to ID Panel     Status: None   Collection Time: 01/24/20  4:15 PM   Specimen: BLOOD  Result Value Ref Range Status   Specimen Description BLOOD LEFT ANTECUBITAL  Final   Special Requests   Final    BOTTLES DRAWN AEROBIC AND ANAEROBIC Blood Culture adequate volume   Culture   Final    NO GROWTH 5 DAYS Performed at [facility], [address] [place], [place] [address]    Report Status 01/29/2020 FINAL  Final  MRSA PCR Screening      Status: None   Collection Time: 01/28/20  2:19 PM   Specimen: Nasal Mucosa; Nasopharyngeal  Result Value Ref Range Status   MRSA by PCR NEGATIVE NEGATIVE Final    Comment:        The GeneXpert MRSA Assay (FDA approved for NASAL specimens only), is one component of a comprehensive MRSA colonization surveillance program. It is not intended to diagnose MRSA infection nor to guide or monitor treatment for MRSA infections. Performed at [facility], [address] [place], [place] [address]    Results for orders placed or performed during the hospital encounter of 01/23/20 (from the past 24 hour(s))  Hemoglobin and hematocrit, blood     Status: Abnormal   Collection Time: 01/31/20  4:12 PM  Result Value Ref Range   Hemoglobin 8.6 (L) 13.0 - 17.0 g/dL   HCT 26.6 (L) 39 - 52 %  Triglycerides     Status: Abnormal   Collection Time: 01/31/20  4:12 PM  Result Value Ref Range   Triglycerides 824 (H) <150 mg/dL  Glucose, capillary     Status: None   Collection Time: 01/31/20  7:46 PM  Result Value Ref Range   Glucose-Capillary 93 70 - 99 mg/dL  Hemoglobin and hematocrit, blood     Status: Abnormal   Collection Time: 01/31/20  9:15 PM  Result Value Ref Range   Hemoglobin 9.1 (L) 13.0 - 17.0 g/dL   HCT 29.2 (L) 39 - 52 %  Glucose, capillary     Status: Abnormal   Collection Time: 01/31/20 11:40 PM  Result Value Ref Range   Glucose-Capillary 108 (H) 70 - 99 mg/dL  Hemoglobin and hematocrit, blood     Status: Abnormal   Collection Time: 02/01/20  3:00 AM  Result Value Ref Range   Hemoglobin 8.7 (L) 13.0 - 17.0 g/dL   HCT 28.5 (L) 39 - 52 %  Glucose, capillary     Status: None   Collection Time: 02/01/20  4:01 AM  Result Value Ref Range   Glucose-Capillary 92 70 - 99 mg/dL  Comprehensive metabolic panel     Status: Abnormal   Collection Time: 02/01/20  4:40 AM  Result Value Ref Range   Sodium 145 135 - 145 mmol/L   Potassium 4.2 3.5 - 5.1 mmol/L   Chloride 109 98 -  111 mmol/L   CO2 28 22 - 32 mmol/L   Glucose, Bld 99 70 - 99 mg/dL   BUN 37 (H) 6 - 20 mg/dL   Creatinine, Ser 0.94 0.61 - 1.24 mg/dL   Calcium 7.6 (L) 8.9 -  10.3 mg/dL   Total Protein 5.9 (L) 6.5 - 8.1 g/dL   Albumin 2.3 (L) 3.5 - 5.0 g/dL   AST 118 (H) 15 - 41 U/L   ALT 46 (H) 0 - 44 U/L   Alkaline Phosphatase 62 38 - 126 U/L   Total Bilirubin 10.1 (H) 0.3 - 1.2 mg/dL   GFR calc non Af Amer >60 >60 mL/min   GFR calc Af Amer >60 >60 mL/min   Anion gap 8 5 - 15  Magnesium     Status: None   Collection Time: 02/01/20  4:40 AM  Result Value Ref Range   Magnesium 1.8 1.7 - 2.4 mg/dL  Phosphorus     Status: None   Collection Time: 02/01/20  4:40 AM  Result Value Ref Range   Phosphorus 3.2 2.5 - 4.6 mg/dL  CBC with Differential/Platelet     Status: Abnormal   Collection Time: 02/01/20  4:40 AM  Result Value Ref Range   WBC 10.1 4.0 - 10.5 K/uL   RBC 2.59 (L) 4.22 - 5.81 MIL/uL   Hemoglobin 8.9 (L) 13.0 - 17.0 g/dL   HCT 27.4 (L) 39 - 52 %   MCV 105.8 (H) 80.0 - 100.0 fL   MCH 34.4 (H) 26.0 - 34.0 pg   MCHC 32.5 30.0 - 36.0 g/dL   RDW 24.1 (H) 11.5 - 15.5 %   Platelets 136 (L) 150 - 400 K/uL   nRBC 0.4 (H) 0.0 - 0.2 %   Neutrophils Relative % 68 %   Neutro Abs 6.9 1.7 - 7.7 K/uL   Lymphocytes Relative 13 %   Lymphs Abs 1.3 0.7 - 4.0 K/uL   Monocytes Relative 12 %   Monocytes Absolute 1.2 (H) 0 - 1 K/uL   Eosinophils Relative 4 %   Eosinophils Absolute 0.4 0 - 0 K/uL   Basophils Relative 1 %   Basophils Absolute 0.1 0 - 0 K/uL   WBC Morphology MORPHOLOGY UNREMARKABLE    RBC Morphology HIDE    Smear Review Normal platelet morphology    Immature Granulocytes 2 %   Abs Immature Granulocytes 0.21 (H) 0.00 - 0.07 K/uL   Polychromasia PRESENT   Glucose, capillary     Status: None   Collection Time: 02/01/20  7:37 AM  Result Value Ref Range   Glucose-Capillary 88 70 - 99 mg/dL  Hemoglobin and hematocrit, blood     Status: Abnormal   Collection Time: 02/01/20  9:45 AM   Result Value Ref Range   Hemoglobin 7.2 (L) 13.0 - 17.0 g/dL   HCT 22.8 (L) 39 - 52 %  Prepare RBC (crossmatch)     Status: None   Collection Time: 02/01/20 10:06 AM  Result Value Ref Range   Order Confirmation      ORDER PROCESSED BY BLOOD BANK Performed at [facility], [address] [place], [place] [address]   Protime-INR     Status: Abnormal   Collection Time: 02/01/20 10:28 AM  Result Value Ref Range   Prothrombin Time 18.5 (H) 11.4 - 15.2 seconds   INR 1.6 (H) 0.8 - 1.2  APTT     Status: Abnormal   Collection Time: 02/01/20 10:28 AM  Result Value Ref Range   aPTT 38 (H) 24 - 36 seconds  Fibrinogen     Status: None   Collection Time: 02/01/20 10:28 AM  Result Value Ref Range   Fibrinogen 368 210 - 475 mg/dL  Type and screen Media  Status: None (Preliminary result)   Collection Time: 02/01/20 10:28 AM  Result Value Ref Range   ABO/RH(D) O POS    Antibody Screen NEG    Sample Expiration 02/04/2020,2359    Unit Number [id number]    Blood Component Type RED CELLS,LR    Unit division 00    Status of Unit ISSUED    Transfusion Status OK TO TRANSFUSE    Crossmatch Result      Compatible Performed at [facility], [address] [place], [place] [address]   Glucose, capillary     Status: Abnormal   Collection Time: 02/01/20 10:59 AM  Result Value Ref Range   Glucose-Capillary 102 (H) 70 - 99 mg/dL  Hemoglobin and hematocrit, blood     Status: Abnormal   Collection Time: 02/01/20  2:39 PM  Result Value Ref Range   Hemoglobin 9.3 (L) 13.0 - 17.0 g/dL   HCT 28.9 (L) 39 - 52 %  Glucose, capillary     Status: Abnormal   Collection Time: 02/01/20  3:44 PM  Result Value Ref Range   Glucose-Capillary 102 (H) 70 - 99 mg/dL     Best Practice/Protocols:  VTE Prophylaxis: Heparin (SQ) GI Prophylaxis: Proton Pump Inhibitor Continous Sedation CIWA  Events: 9/6 transferred to ICU for increased WOB, using accessory  muscles to breathe with liver failure 9/7 remains on vent, liver failure, PICC line placed, brother updated 9/8 remains encephalopathic, liver failure, resp failure 9/10 remains on ventilator.  Long discussion and update with brother.  Sedatives switch to Precedex and fentanyl, continue CIWA scale  Studies: DG Abd 1 View  Result Date: 01/28/2020 CLINICAL DATA:  Orogastric tube placement EXAM: ABDOMEN - 1 VIEW COMPARISON:  None. FINDINGS: A nasogastric tube is present with side port in the proximal stomach and distal port in the stomach body. Lower thoracic and upper lumbar spondylosis. Mild vascular accentuation in the lung bases. IMPRESSION: 1. Nasogastric tube is present with side port in the proximal stomach body. 2. Mild vascular accentuation in the lung bases. Electronically Signed   By: Van Clines M.D.   On: 01/28/2020 15:04   CT PELVIS WO CONTRAST  Result Date: 01/24/2020 CLINICAL DATA:  Pain, question fracture EXAM: CT PELVIS WITHOUT CONTRAST TECHNIQUE: Multidetector CT imaging of the pelvis was performed following the standard protocol without intravenous contrast. COMPARISON:  None. FINDINGS: Urinary Tract: The visualized distal ureters and bladder appear unremarkable. Bowel: No bowel wall thickening, distention or surrounding inflammation identified within the pelvis. Vascular/Lymphatic: No enlarged pelvic lymph nodes identified. No significant vascular findings. Reproductive: The prostate is unremarkable. Other: Bilateral subcutaneous edema seen around the hips. Musculoskeletal: No fracture or dislocation. 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 There is a large heterogeneous hyperdense areas seen throughout the right gluteal musculature with expansion most notable within the gluteus medius muscle belly, likely intramuscular hematoma. There is also a small subcutaneous hyperdense collections seen overlying the left  gluteal musculature, likely superficial hematomas. IMPRESSION: No acute osseous abnormality. Large intramuscular hematoma within the right gluteal musculature. Overlying subcutaneous edema and skin thickening. superficial subcutaneous small hematoma seen overlying the left gluteal musculature. Electronically Signed   By: Prudencio Pair M.D.   On: 01/24/2020 02:45   MR HIP RIGHT WO CONTRAST  Result Date: 01/27/2020 CLINICAL DATA:  Right gluteal hematoma, for further characterization. EXAM: MR OF THE RIGHT HIP WITHOUT CONTRAST TECHNIQUE: Multiplanar, multisequence MR imaging was performed. No intravenous contrast was administered. COMPARISON:  CT pelvis 01/24/2020 FINDINGS: The patient terminated the exam following the completion of coronal T1 and T2 weighted images. These images are adversely affected by motion artifact. The patient did not allow completion of the exam, and due to these factors, diagnostic sensitivity and specificity of the examination is reduced. Bones: Accentuated T2 signal and reduced T1 signal in the pubic bodies bilaterally suspicious for osteitis pubis. No surrounding fluid collection to suggest infection. SI joints grossly unremarkable. No observed fracture. Articular cartilage and labrum Articular cartilage: Difficult to assess given the lack of small field-of-view images and the motion artifact. Labrum: Today's exam is not diagnostic for assessment of the labrum. Joint or bursal effusion Joint effusion:  Absent Bursae: No bursitis. Muscles and tendons Muscles and tendons: Heterogeneous hematoma primarily in the right gluteus medius muscle, but also tracking up through the sciatic notch along the right piriformis muscle, measuring approximately 22.1 by 13.5 by 8.5 cm (volume = 1300 cm^3). By my estimates this is similar in volume to the prior CT from 01/24/2020, indicating no significant change over the 12 hours between the 2 studies. The hematoma appears to moderately impinge on the right  sciatic nerve at and distal to the sciatic notch. Low-level asymmetric edema in the right gluteus maximus muscle. Trace edema along the right hip adductor musculature. Other findings Miscellaneous: We partially include apparent hematoma in the subcutaneous tissues lateral to the left gluteus musculature, better shown on the CT examination. There is subcutaneous edema along both buttock and flank regions. Mild edema in the retroperitoneum, presacral region, and along the right pelvic sidewall. IMPRESSION: 1. Heterogeneous hematoma primarily in the right gluteus medius muscle, but also tracking up through the sciatic notch along the right piriformis muscle. Similar volume to prior CT about 1300 cubic cm. The hematoma appears to moderately impinge on the right sciatic nerve at and distal to the sciatic notch. 2. Low-level edema in the right gluteus maximus and right hip adductor musculature. 3. Subcutaneous edema along both buttock and flank regions. 4. 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 5. Subcutaneous hematoma lateral to the left gluteal musculature, better shown on the prior CT. 6. Mild edema in the retroperitoneum, presacral region, and along the right pelvic sidewall. 7. The patient terminated the exam following the completion of the exam, and due to motion artifact. The patient did not allow completion of the exam, and due to these factors, diagnostic sensitivity and specificity of the examination is reduced. Electronically Signed   By: Van Clines M.D.   On: 01/27/2020 15:44   DG Chest Port 1 View  Result Date: 02/01/2020 CLINICAL DATA:  Acute respiratory failure EXAM: PORTABLE CHEST 1 VIEW COMPARISON:  01/28/2020 FINDINGS: Endotracheal and enteric tubes are unchanged in position. Shallow inspiration. Cardiac enlargement with pulmonary vascular congestion. No definite edema or consolidation. No pleural effusions. No pneumothorax. IMPRESSION: Cardiac enlargement with pulmonary vascular congestion. No edema or  consolidation. Electronically Signed   By: Lucienne Capers M.D.   On: 02/01/2020 01:58   DG Chest Port 1 View  Result Date: 01/28/2020 CLINICAL DATA:  Endotracheal tube placement for respiratory failure. Acute toxic metabolic encephalopathy. Acute liver failure complicated by rhabdomyolysis. EXAM: PORTABLE CHEST 1 VIEW COMPARISON:  None. FINDINGS: Endotracheal tube tip is 2.7 cm above the carina, satisfactorily position. Nasogastric tube side port is in the vicinity of the gastric cardia with distal port in the stomach body. Mild enlargement of the cardiopericardial silhouette noted with indistinct pulmonary vasculature and cephalization of blood flow suspicious for pulmonary venous hypertension.  Borderline appearance for interstitial edema. IMPRESSION: 1. Endotracheal tube tip is 2.7 cm above the carina, satisfactorily positioned. 2. Mild enlargement of the cardiopericardial silhouette with suspected pulmonary venous hypertension and borderline appearance for interstitial edema. Electronically Signed   By: Van Clines M.D.   On: 01/28/2020 15:08   DG Hip Unilat W or Wo Pelvis 2-3 Views Right  Result Date: 01/23/2020 CLINICAL DATA:  Right hip pain post fall EXAM: DG HIP (WITH OR WITHOUT PELVIS) 2-3V RIGHT COMPARISON:  None. FINDINGS: There is marked lateral soft tissue swelling of the right hip. Suspicious for overlying contusive change and/or hematoma. There is a questionable lucency along the posterolateral aspect of the right femoral head neck junction which could reflect a nondisplaced fracture or some projectional periarticular spurring. Additional suspicious lucency is seen along the junction of the left inferior pubic ramus and pubic body best seen on the lateral hip radiograph. Could correlate for point tenderness in this location. There is a background of diffuse degenerative change in the lower spine, hips and pelvis. Enthesopathic changes noted as well at the iliac crest, ischial  tuberosities and lesser trochanters bilaterally. IMPRESSION: 1. Marked lateral soft tissue swelling of the right hip. Suspicious for overlying contusive change and/or hematoma. 2. Questionable lucency along the posterolateral aspect of the right femoral head neck junction which could reflect a nondisplaced fracture or some projectional periarticular spurring. 3. Additional suspicious lucency along the junction of the left inferior pubic ramus and pubic body best seen on the lateral radiograph. 4. Given these latter findings, further evaluation with a bony pelvis CT could be obtained for more definitive assessment. Electronically Signed   By: Lovena Le M.D.   On: 01/23/2020 21:55   [place] EKG SITE RITE  Result Date: 01/28/2020 If Site Rite image not attached, placement could not be confirmed due to current cardiac rhythm.  US Abdomen Limited RUQ  Result Date: 01/23/2020 CLINICAL DATA:  Elevated liver enzymes EXAM: ULTRASOUND ABDOMEN LIMITED RIGHT UPPER QUADRANT COMPARISON:  None. FINDINGS: Gallbladder: No gallstones or wall thickening visualized. No sonographic Murphy sign noted by sonographer. Common bile duct: Diameter: 5.9 mm Liver: Increased echogenicity seen throughout the liver parenchyma. No focal hepatic lesion. No biliary ductal dilatation. The portal vein is patent, with reversal of flow however and recanalized umbilical vein. Other: None. IMPRESSION: Hepatic steatosis Reversal of flow in the portal vein with findings suggestive of portal hypertension Electronically Signed   By: Prudencio Pair M.D.   On: 01/23/2020 21:21    Consults: Treatment Team:  Leim Fabry, MD   Subjective:    Overnight Issues: Remains on the ventilator, requires heavy sedation due to tremors and evidence of withdrawal on decreased sedation.  Objective:  Vital signs for last 24 hours: Temp:  [98 F (36.7 C)-100.7 F (38.2 C)] 98 F (36.7 C) (09/10 1544) Pulse Rate:  [67-107] 67 (09/10 1500) Resp:  [17-22] 20  (09/10 1500) BP: (91-157)/(50-79) 99/59 (09/10 1544) SpO2:  [94 %-99 %] 96 % (09/10 1500) FiO2 (%):  [28 %] 28 % (09/10 1413) Weight:  [119.7 kg] 119.7 kg (09/10 0500)  Hemodynamic parameters for last 24 hours:    Intake/Output from previous day: 09/09 0701 - 09/10 0700 In: 1907 [I.V.:1507; IV Piggyback:400] Out: 1000 [Urine:1000]  Intake/Output this shift: Total I/O In: 1674.1 [I.V.:1635.8; IV Piggyback:38.3] Out: 150 [Urine:150]  Vent settings for last 24 hours: Vent Mode: PRVC FiO2 (%):  [28 %] 28 % Set Rate:  [20 bmp] 20 bmp Vt Set:  [500  mL] 500 mL PEEP:  [5 cmH20] 5 cmH20 Pressure Support:  [5 cmH20] 5 cmH20 Plateau Pressure:  [15 cmH20] 15 cmH20  Physical Exam:  GENERAL: Acutely ill-appearing male, intubated mechanically ventilated.  Synchronous with the vent. HEAD: Normocephalic, atraumatic.  EYES: Pupils equal, round, reactive to light.  Scleral icterus noted.  MOUTH: Orotracheally intubated, OG in place. NECK: Supple. No thyromegaly. Trachea midline. No JVD.  No adenopathy. PULMONARY: Good air entry bilaterally.  Coarse breath sounds otherwise no other adventitious sounds. CARDIOVASCULAR: S1 and S2. Regular rate and rhythm.  No rubs, murmurs or gallops heard. ABDOMEN: Obese, soft, nondistended.  Normoactive bowel sounds.  Tolerating tube feeds. MUSCULOSKELETAL: No joint deformity, no clubbing, 1+ anasarca. NEUROLOGIC: Agitated when sedation decreased, no overt focal deficit noted.  Cannot make further assessment. SKIN: Intact,warm,dry.  Multiple ecchymotic areas notably on left neck, bilateral hips, lower back, lower extremities. PSYCH: Unable to assess due to mechanically ventilated status.  Assessment/Plan:   Acute respiratory failure with hypoxia Due to altered mental status/EtOH withdrawal Encephalopathy from liver failure Continue supportive care with ventilator Patient on minimal settings Mental status is limitation to extubation Reassess for SAT/SBT  daily Continue Precedex, fentanyl and CIWA  Acute toxic metabolic encephalopathy EtOH withdrawal Hepatic dysfunction Supportive care Thiamine IV, high-dose Sedation as above Continue CIWA scale Trend ammonia and treat as needed  Acute alcoholic hepatitis Hepatic encephalopathy Supportive care Continue nutrition Trend LFTs Trend ammonia and treat as needed Rifaximin 550 mg via OG twice daily Lactulose twice daily  Rhabdomyolysis Supportive care Improving Continue to trend CK  Hematoma due to trauma Reabsorption of blood will also cause hepatic dysfunction Continue to monitor  Nutrition Status: Nutrition Problem: Inadequate oral intake Etiology: inability to eat Signs/Symptoms: NPO status Interventions: Tube feeding, Prostat, MVI     LOS: 9 days   Additional comments: Discussed during multidisciplinary rounds.  I discussed at length with patient's brother at bedside.  All questions and concerns were answered/addressed to his satisfaction.  Started VTE prophylaxis with heparin subcu after close evaluation of coags.  Patient does not appear to have ongoing active bleeding.  His hematomas should be well organized by now.  I discussed this with the patient's brother.  Critical Care Total Time*: 45 Minutes  C. Derrill Kay, MD [place] [facility] 02/01/2020  *Care during the described time interval was provided by me and/or other providers on the critical care team.  I have reviewed this patient's available data, including medical history, events of note, physical examination and test results as part of my evaluation.

## 2020-02-01 NOTE — TOC Progression Note (Addendum)
[facility]) - Progression Note    Patient Details  Name: Rakeem Colley MRN: [id number] Date of Birth: 05/18/1971  Transition [facility] Care [facility] CM/SW [place], LCSW Phone Number: 02/01/2020, 3:00 PM  Clinical Narrative:   CSW was asked to talk to patient's brother about financial resources. CSW met with patient's brother [name] at bedside. [name] reported he "just wants to make sure whoever needs to be is paid" for his brother's care at the hospital. [name] reported he had concerns about patient's care being impacted by patient not having insurance. CSW provided reassurance that this would not impact patient's care. [name] reported other staff has also reassured him of this. [name] had a filled out application for [employer] through [place] for patient's hospital bill. CSW made copy of passport for [name] and he plans to mail the application and needed documents. [name] also had questions about applying for Medicaid for patient, CSW Advice worker. [name] reported he is hopeful that patient will improve medically and "wants him to be brought back as many times as needed." CSW relayed this to Palliative NP.   Encouraged [name] to reach out with any resource needs or questions.    Expected Discharge Plan: Home/Self Care Barriers to Discharge: Continued Medical Work up  Expected Discharge Plan and Services Expected Discharge Plan: Home/Self Care       Living arrangements for the past 2 months: Single Family Home                                       Social Determinants of Health (SDOH) Interventions    Readmission Risk Interventions No flowsheet data found.

## 2020-02-01 NOTE — Progress Notes (Signed)
                                                   Daily Progress Note   Patient Name: [name]       Date: 02/01/2020 DOB: 01/03/1971  Age: 49 y.o. MRN#: [id number] Attending Physician: Kasa, Kurian, MD Primary Care Physician: Lindley, Cheryl P, FNP Admit Date: 01/23/2020  Reason for Consultation/Follow-up: Establishing goals of care  Subjective: Brother Kerry at bedside, patient intubated and sedated  Length of Stay: 9  Current Medications: Scheduled Meds:  . chlorhexidine gluconate (MEDLINE KIT)  15 mL Mouth Rinse BID  . Chlorhexidine Gluconate Cloth  6 each Topical Daily  . folic acid  1 mg Per Tube Daily  . mouth rinse  15 mL Mouth Rinse 10 times per day  . metoprolol tartrate  12.5 mg Per Tube BID  . multivitamin with minerals  1 tablet Per Tube Daily  . pantoprazole (PROTONIX) IV  40 mg Intravenous Q12H  . QUEtiapine  50 mg Per Tube BID  . rifaximin  550 mg Oral BID  . sodium chloride flush  10-40 mL Intracatheter Q12H  . thiamine  100 mg Per Tube Daily   Or  . thiamine  100 mg Intravenous Daily    Continuous Infusions: . sodium chloride 5 mL/hr at 02/01/20 0200  . sodium chloride    . sodium chloride    . sodium chloride    . sodium chloride    . ceFEPime (MAXIPIME) IV Stopped (02/01/20 0533)  . dexmedetomidine (PRECEDEX) IV infusion 0.4 mcg/kg/hr (02/01/20 1232)  . feeding supplement (VITAL HIGH PROTEIN) 1,000 mL (02/01/20 1106)  . fentaNYL infusion INTRAVENOUS 300 mcg/hr (02/01/20 1232)  . midazolam Stopped (02/01/20 1039)  . phenylephrine (NEO-SYNEPHRINE) Adult infusion 25 mcg/min (02/01/20 1232)    PRN Meds: sodium chloride, acetaminophen **OR** acetaminophen, fentaNYL, lactulose, midazolam, ondansetron **OR** ondansetron (ZOFRAN) IV, sodium chloride flush, traZODone  Physical Exam Constitutional:       Comments: Intubated and sedated  Pulmonary:     Effort: Pulmonary effort is normal.  Musculoskeletal:     Right lower leg: Edema present.     Left lower leg: Edema present.             Vital Signs: BP 96/62   Pulse 70   Temp 98.5 F (36.9 C) (Oral)   Resp 20   Ht 5' 7" (1.702 m)   Wt 119.7 kg   SpO2 96%   BMI 41.33 kg/m  SpO2: SpO2: 96 % O2 Device: O2 Device: Ventilator O2 Flow Rate: O2 Flow Rate (L/min): 3 L/min  Intake/output summary:   Intake/Output Summary (Last 24 hours) at 02/01/2020 1426 Last data filed at 02/01/2020 1232 Gross per 24 hour  Intake 2761.25 ml  Output 1000 ml  Net 1761.25 ml   LBM: Last BM Date: 02/01/20 Baseline Weight: Weight: 108.9 kg Most recent weight: Weight: 119.7 kg       Palliative Assessment/Data: PPS 30%    Flowsheet Rows     Most Recent Value  Intake Tab  Referral Department Critical care  Unit at Time of Referral ICU  Palliative Care Primary Diagnosis Sepsis/Infectious Disease  Date Notified 01/28/20  Palliative Care Type New Palliative care  Reason for referral Clarify Goals of Care  Date of Admission 01/28/20  Date first seen by Palliative   Care 01/30/20  # of days Palliative referral response time 2 Day(s)  # of days IP prior to Palliative referral 0  Clinical Assessment  Palliative Performance Scale Score 30%  Psychosocial & Spiritual Assessment  Palliative Care Outcomes  Patient/Family meeting held? Yes  Who was at the meeting? brother  Palliative Care Outcomes Clarified goals of care, Provided psychosocial or spiritual support      Patient Active Problem List   Diagnosis Date Noted  . Alcohol abuse   . Goals of care, counseling/discussion   . Palliative care by specialist   . Rhabdomyolysis 01/25/2020  . Sepsis ([place]) 01/25/2020  . Hypovolemia 01/25/2020  . Hypotension 01/25/2020  . GIB (gastrointestinal bleeding) 01/24/2020  . Transaminitis 01/24/2020  . Hyponatremia 01/24/2020  . Hypokalemia 01/24/2020   . Falls 01/24/2020  . Hip pain 01/24/2020  . Acute blood loss anemia 01/23/2020  . Substance induced mood disorder ([place]) 05/04/2017    Palliative Care Assessment & Plan   HPI: 49 y.o.malewith past medical history of ETOH abuseadmitted on 9/1/2021with generalized weakness and a fall.Diagnosed with acute alcoholic hepatitis. Is also being treated for aspiration pna. Probable hepatic encephalopathy. Also with acute diastolic heart failure. PMT consulted to discuss [place].  Assessment: Update received from RN and Dr. Patsey Berthold at bedside.  Brother [name] at bedside continues to be interested in full code/full scope interventions - asks questions about transfer to another hospital. Ultimately he agrees transfer is not needed at this time. All questions and concerns answered to the best of my ability. We again discussed we expect prolonged hospitalization d/t Tonya's severe illness. Emotional support provided to [place].   Recommendations/Plan:  Full code/full scope - family prepared for prolonged hospital course  PMT to follow  Code Status:  Full code  Prognosis:   Unable to determine  Discharge Planning:  To Be Determined  Care plan was discussed with Dr. Patsey Berthold, RN, brother [name]  Thank you for allowing the Palliative Medicine Team to assist in the care of this patient.   Total Time 25 minutes Prolonged Time Billed  no       Greater than 50%  of this time was spent counseling and coordinating care related to the above assessment and plan.  Juel Burrow, DNP, [facility] Palliative Medicine Team Team Phone # [phone]  Pager [phone]

## 2020-02-01 NOTE — Progress Notes (Signed)
Pt has remained intubated and alert to voice throughout shift. WUA cxled due to pt having tremors, respiratory wise he was improving. 1L LR Bolus given with no improvement in BP. Pt started on Neo. Transfused 1 U PRBC, no reaction. Pts brother at bedside throughout shift. Started on precdex, per MD Dc'd versed. Pt has remained NSR with frequent bigeminy noted. Still Jaundice in color and Amber/red colored urine. Will continue to monitor.
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
## 2020-02-01 NOTE — Progress Notes (Signed)
Nutrition Follow-up  DOCUMENTATION CODES:   Obesity unspecified  INTERVENTION:  Advance to Vital High Protein at 65 mL/hr (1560 mL goal daily volume). Provides 1560 kcal, 137 grams of protein, 1310 mL H2O daily.  Will discontinue PROSource TF.  NUTRITION DIAGNOSIS:   Inadequate oral intake related to inability to eat as evidenced by NPO status.  Ongoing.  GOAL:   Patient will meet greater than or equal to 90% of their needs  Met with TF regimen.  MONITOR:   Vent status, Labs, Weight trends, TF tolerance, I & O's  REASON FOR ASSESSMENT:   Ventilator, Consult Enteral/tube feeding initiation and management  ASSESSMENT:   49 year old male with PMHx of polysubstance abuse, EtOH abuse admitted with acute onset of generalized weakness and mechanical fall with subsequent right hip pain and large right hip hematoma, also with liver failure, hepatic encephalopathy, acute aspiration PNA, possible OSA.  Patient is currently intubated on ventilator support MV: 10 L/min Temp (24hrs), Avg:99.6 F (37.6 C), Min:98.6 F (37 C), Max:100.7 F (38.2 C)  Medications reviewed and include: folic acid 1 mg daily per tube, MVI daily per tube, Protonix, thiamine 100 mg daily per tube, cefepime, Precedex gtt, fentanyl gtt, Versed gtt.  Labs reviewed: CBG 88, BUN 37.  I/O: 1000 mL UOP yesterday (0.3 mL/kg/hr)  Weight trend: 119.7 kg on 9/10; +10.8 kg from 9/2 likely from fluid status as pt +9L this admission with mild-moderate pitting edema  Enteral Access: OGT; terminates in stomach per abdominal x-ray 9/6  TF regimen: Vital High Protein at 35 mL/hr + PROSource 90 mL TID  Discussed with RN. Patient now off propofol gtt.  Diet Order:   Diet Order    None     EDUCATION NEEDS:   No education needs have been identified at this time  Skin:  Skin Assessment: Reviewed RN Assessment  Last BM:  02/01/2020 - medium type 7; rectal tube in place  Height:   Ht Readings from Last 1  Encounters:  01/24/20 5' 7" (1.702 m)   Weight:   Wt Readings from Last 1 Encounters:  02/01/20 119.7 kg   Ideal Body Weight:  67.3 kg  BMI:  Body mass index is 41.33 kg/m.  Estimated Nutritional Needs:   Kcal:  8786-7672 (11-14 kcal/kg)  Protein:  135 grams (2 grams/kg IBW)  Fluid:  1.8 L/day  Jacklynn Barnacle, MS, RD, LDN Pager number available on Amion

## 2020-02-02 DIAGNOSIS — K7200 Acute and subacute hepatic failure without coma: Secondary | ICD-10-CM

## 2020-02-02 LAB — CBC WITH DIFFERENTIAL/PLATELET
Abs Immature Granulocytes: 0.15 10*3/uL — ABNORMAL HIGH (ref 0.00–0.07)
Basophils Absolute: 0.1 10*3/uL (ref 0.0–0.1)
Basophils Relative: 1 %
Eosinophils Absolute: 0.4 10*3/uL (ref 0.0–0.5)
Eosinophils Relative: 5 %
HCT: 29.8 % — ABNORMAL LOW (ref 39.0–52.0)
Hemoglobin: 9.6 g/dL — ABNORMAL LOW (ref 13.0–17.0)
Immature Granulocytes: 2 %
Lymphocytes Relative: 15 %
Lymphs Abs: 1.3 10*3/uL (ref 0.7–4.0)
MCH: 32.7 pg (ref 26.0–34.0)
MCHC: 32.2 g/dL (ref 30.0–36.0)
MCV: 101.4 fL — ABNORMAL HIGH (ref 80.0–100.0)
Monocytes Absolute: 1.1 10*3/uL — ABNORMAL HIGH (ref 0.1–1.0)
Monocytes Relative: 13 %
Neutro Abs: 5.2 10*3/uL (ref 1.7–7.7)
Neutrophils Relative %: 64 %
Platelets: 153 10*3/uL (ref 150–400)
RBC: 2.94 MIL/uL — ABNORMAL LOW (ref 4.22–5.81)
RDW: 26.9 % — ABNORMAL HIGH (ref 11.5–15.5)
Smear Review: NORMAL
WBC: 8.2 10*3/uL (ref 4.0–10.5)
nRBC: 0.6 % — ABNORMAL HIGH (ref 0.0–0.2)

## 2020-02-02 LAB — COMPREHENSIVE METABOLIC PANEL
ALT: 49 U/L — ABNORMAL HIGH (ref 0–44)
AST: 122 U/L — ABNORMAL HIGH (ref 15–41)
Albumin: 2.3 g/dL — ABNORMAL LOW (ref 3.5–5.0)
Alkaline Phosphatase: 69 U/L (ref 38–126)
Anion gap: 7 (ref 5–15)
BUN: 46 mg/dL — ABNORMAL HIGH (ref 6–20)
CO2: 26 mmol/L (ref 22–32)
Calcium: 7.8 mg/dL — ABNORMAL LOW (ref 8.9–10.3)
Chloride: 111 mmol/L (ref 98–111)
Creatinine, Ser: 1.06 mg/dL (ref 0.61–1.24)
GFR calc Af Amer: >60 mL/min (ref >60)
GFR calc non Af Amer: >60 mL/min (ref >60)
Glucose, Bld: 116 mg/dL — ABNORMAL HIGH (ref 70–99)
Potassium: 4.4 mmol/L (ref 3.5–5.1)
Sodium: 144 mmol/L (ref 135–145)
Total Bilirubin: 8.8 mg/dL — ABNORMAL HIGH (ref 0.3–1.2)
Total Protein: 6.2 g/dL — ABNORMAL LOW (ref 6.5–8.1)

## 2020-02-02 LAB — URINALYSIS, COMPLETE (UACMP) WITH MICROSCOPIC
Bilirubin Urine: NEGATIVE
Glucose, UA: NEGATIVE mg/dL
Ketones, ur: NEGATIVE mg/dL
Leukocytes,Ua: NEGATIVE
Nitrite: NEGATIVE
Protein, ur: NEGATIVE mg/dL
Specific Gravity, Urine: 1.016 (ref 1.005–1.030)
Squamous Epithelial / HPF: NONE SEEN (ref 0–5)
pH: 5 (ref 5.0–8.0)

## 2020-02-02 LAB — GLUCOSE, CAPILLARY
Glucose-Capillary: 98 mg/dL (ref 70–99)
Glucose-Capillary: 119 mg/dL — ABNORMAL HIGH (ref 70–99)
Glucose-Capillary: 104 mg/dL — ABNORMAL HIGH (ref 70–99)
Glucose-Capillary: 109 mg/dL — ABNORMAL HIGH (ref 70–99)
Glucose-Capillary: 109 mg/dL — ABNORMAL HIGH (ref 70–99)

## 2020-02-02 LAB — MAGNESIUM: Magnesium: 1.9 mg/dL (ref 1.7–2.4)

## 2020-02-02 LAB — HEMOGLOBIN AND HEMATOCRIT, BLOOD
HCT: 28.9 % — ABNORMAL LOW (ref 39.0–52.0)
HCT: 31.4 % — ABNORMAL LOW (ref 39.0–52.0)
Hemoglobin: 9.3 g/dL — ABNORMAL LOW (ref 13.0–17.0)
Hemoglobin: 9.9 g/dL — ABNORMAL LOW (ref 13.0–17.0)

## 2020-02-02 LAB — PHOSPHORUS: Phosphorus: 3 mg/dL (ref 2.5–4.6)

## 2020-02-02 LAB — AMMONIA: Ammonia: 37 umol/L — ABNORMAL HIGH (ref 9–35)

## 2020-02-02 LAB — CK: Total CK: 210 U/L (ref 49–397)

## 2020-02-02 MED ORDER — LACTULOSE 10 GM/15ML PO SOLN: 30.0000 g | Freq: Two times a day (BID) | ORAL | Status: DC | PRN

## 2020-02-02 MED ORDER — LACTULOSE 10 GM/15ML PO SOLN
30.0000 g | Freq: Two times a day (BID) | ORAL | Status: DC
  Administered 2020-02-04 – 2020-02-05 (×3): 30 g
  Filled 2020-02-02 (×4): qty 60

## 2020-02-02 MED ORDER — ONDANSETRON HCL 4 MG/2ML IJ SOLN
4.0000 mg | Freq: Four times a day (QID) | INTRAMUSCULAR | Status: DC | PRN
  Administered 2020-02-03: 4 mg via INTRAVENOUS
  Filled 2020-02-02: qty 2

## 2020-02-02 MED ORDER — ACETAMINOPHEN 325 MG PO TABS: 650.0000 mg | ORAL_TABLET | Freq: Four times a day (QID) | ORAL | Status: DC | PRN

## 2020-02-02 MED ORDER — ACETAMINOPHEN 650 MG RE SUPP
650.0000 mg | Freq: Four times a day (QID) | RECTAL | Status: DC | PRN
  Administered 2020-02-04: 650 mg via RECTAL
  Filled 2020-02-02: qty 1

## 2020-02-02 MED ORDER — DEXMEDETOMIDINE HCL IN NACL 400 MCG/100ML IV SOLN: 0.2000 ug/kg/h | INTRAVENOUS | Status: DC

## 2020-02-02 MED ORDER — FUROSEMIDE 10 MG/ML IJ SOLN
INTRAMUSCULAR | Status: AC
  Filled 2020-02-02: qty 4

## 2020-02-02 MED ORDER — LORAZEPAM 2 MG/ML IJ SOLN
1.0000 mg | INTRAMUSCULAR | Status: DC | PRN
  Administered 2020-02-03 (×3): 2 mg via INTRAVENOUS
  Administered 2020-02-05: 1 mg via INTRAVENOUS
  Administered 2020-02-06 – 2020-02-08 (×4): 2 mg via INTRAVENOUS
  Administered 2020-02-08: 1 mg via INTRAVENOUS
  Administered 2020-02-09 – 2020-02-11 (×7): 2 mg via INTRAVENOUS
  Filled 2020-02-02 (×17): qty 1

## 2020-02-02 MED ORDER — TRAZODONE HCL 50 MG PO TABS: 25.0000 mg | ORAL_TABLET | Freq: Every evening | ORAL | Status: DC | PRN

## 2020-02-02 MED ORDER — FUROSEMIDE 10 MG/ML IJ SOLN
40.0000 mg | Freq: Once | INTRAMUSCULAR | Status: AC
  Administered 2020-02-02: 40 mg via INTRAVENOUS

## 2020-02-02 MED ORDER — ONDANSETRON HCL 4 MG PO TABS: 4.0000 mg | ORAL_TABLET | Freq: Four times a day (QID) | ORAL | Status: DC | PRN

## 2020-02-02 MED ORDER — RIFAXIMIN 550 MG PO TABS
550.0000 mg | ORAL_TABLET | Freq: Two times a day (BID) | ORAL | Status: DC
  Administered 2020-02-04 – 2020-02-05 (×2): 550 mg
  Filled 2020-02-02 (×3): qty 1

## 2020-02-02 MED ORDER — LORAZEPAM 2 MG/ML IJ SOLN
INTRAMUSCULAR | Status: AC
  Administered 2020-02-02: 2 mg
  Filled 2020-02-02: qty 1

## 2020-02-02 NOTE — Care Plan (Addendum)
Patient was placed on SBT and tolerated.  He did require Lasix IV prior to extubation.  He had good urine output.  Able to extubate to nasal cannula without difficulty.  We will continue to wean Precedex.  He is off of all other sedatives.  Appears to be calm.  Brother has been at bedside.  Gailen Shelter, MD [place] [facility]

## 2020-02-02 NOTE — Progress Notes (Signed)
Pharmacy Antibiotic Note  [name] is a 49 y.o. male admitted on 01/23/2020 with symptomatic blood loss anemia and alcohol intoxication. Patient has PMH of alcoholic hepatitis, HLD, and HTN.  overnight patient was tachypneic, tachycardic, and has elevated blood pressure. Tmax 102.9. Patient is going through DTs.  Pharmacy has been consulted for Cefepime dosing for Sepsis.  Plan: Continue Cefepime 2g IV every 8 hours  (to finish 21 doses on 9/13)  Height: 5' 7.01" (170.2 cm) Weight: 120.2 kg (264 lb 15.9 oz) IBW/kg (Calculated) : 66.12  Temp (24hrs), Avg:99.1 F (37.3 C), Min:98 F (36.7 C), Max:99.8 F (37.7 C)  Recent Labs  Lab 01/27/20 1046 01/28/20 0445 01/29/20 0427 01/30/20 0319 01/31/20 0355 02/01/20 0440 02/02/20 0500  WBC  --   --  6.6 8.2 8.8 10.1 8.2  CREATININE  --    < > 0.46* 0.86 0.74 0.94 1.06  LATICACIDVEN 1.2  --   --   --   --   --   --    < > = values in this interval not displayed.    Estimated Creatinine Clearance: 104.6 mL/min (by C-G formula based on SCr of 1.06 mg/dL).    No Known Allergies  Antimicrobials this admission: 9/2 ceftriaxone  >> 9/6 9/6 Cefepime >>   Microbiology results: 9/2 BCx: NG x 4 days  9/6 MRSA PCR: pending  Thank you for allowing pharmacy to be a part of this patient's care.  Albina Billet, PharmD, BCPS Clinical Pharmacist 02/02/2020 1:49 PM

## 2020-02-02 NOTE — Progress Notes (Addendum)
Follow up - Critical Care Medicine Note  Patient Details:    [name] is an 49 y.o. male with a known history of polysubstance abuse including alcohol abuse who presented to the emergency room with acute onset of increased generalized weakness and amechanicalfall. Alcohol level 296 on admission.  Patient was transferred to ICU on 6 September due to increased work of breathing and accessory muscle use.  He was withdrawing from alcohol required intubation and mechanical ventilation.  Lines, Airways, Drains: Airway 8 mm (Active)  Secured at (cm) 24 cm 02/01/20 1555  Measured From Lips 02/01/20 [place] 02/01/20 1555  Secured By [employer] 02/01/20 1555  Tube Holder Repositioned Yes 02/01/20 1413  Cuff Pressure (cm H2O) 28 cm H2O 02/01/20 1413  Site Condition Dry 02/01/20 1555     PICC Triple Lumen 01/28/20 PICC Right Cephalic 39 cm 0 cm (Active)  Indication for Insertion or Continuance of Line Prolonged intravenous therapies 02/01/20 0800  Exposed Catheter (cm) 0 cm 01/28/20 1817  Site Assessment Clean;Dry;Intact 02/01/20 0800  Lumen #1 Status Infusing 02/01/20 0800  Lumen #2 Status Infusing 01/31/20 1930  Lumen #3 Status Flushed;Saline locked 02/01/20 0800  Dressing Type Transparent;Occlusive 02/01/20 0800  Dressing Status Clean;Dry;Intact;Antimicrobial disc in place 02/01/20 0800  Safety Lock Not Applicable 52/84/13 2440  Line Care Connections checked and tightened 02/01/20 0800  Line Adjustment (NICU/IV Team Only) No 01/28/20 1817  Dressing Intervention New dressing 01/28/20 1817  Dressing Change Due 02/04/20 02/01/20 0800     NG/OG Tube Center mouth Xray Documented cm marking at nare/ corner of mouth 52 cm (Active)  Cm Marking at Nare/Corner of Mouth (if applicable) 52 cm 03/19/24 1555  Site Assessment Clean;Dry;Intact 02/01/20 1555  Ongoing Placement Verification No change in cm markings or external length of tube from initial placement  02/01/20 1555  Status Infusing tube feed 02/01/20 1555  Amount of suction 80 mmHg 01/31/20 1930  Drainage Appearance Bile;Green 01/31/20 1930  Intake (mL) 150 mL 01/29/20 1700     Rectal Tube/Pouch (Active)  Output (mL) 200 mL 01/30/20 2000     Urethral Catheter [name] Latex (Active)  Indication for Insertion or Continuance of Catheter Therapy based on hourly urine output monitoring and documentation for critical condition (NOT STRICT I&O) 02/01/20 0805  Site Assessment Clean;Intact;Dry 02/01/20 0805  Catheter Maintenance Bag below level of bladder;Catheter secured;Drainage bag/tubing not touching floor;Insertion date on drainage bag;No dependent loops;Seal intact 02/01/20 0805  Collection Container Standard drainage bag 02/01/20 0805  Securement Method Securing device (Describe) 02/01/20 0805  Urinary Catheter Interventions (if applicable) Unclamped 36/64/40 0805  Output (mL) 150 mL 02/01/20 1400    Anti-infectives:  Anti-infectives (From admission, onward)   Start     Dose/Rate Route Frequency Ordered Stop   02/02/20 2200  rifaximin (XIFAXAN) tablet 550 mg        550 mg Per Tube 2 times daily 02/02/20 0928     01/29/20 1530  rifaximin (XIFAXAN) tablet 550 mg  Status:  Discontinued        550 mg Oral 2 times daily 01/29/20 1500 02/02/20 0928   01/28/20 1515  ceFEPIme (MAXIPIME) 2 g in sodium chloride 0.9 % 100 mL IVPB        2 g 200 mL/hr over 30 Minutes Intravenous Every 8 hours 01/28/20 1504 02/04/20 1359   01/24/20 1600  cefTRIAXone (ROCEPHIN) 2 g in sodium chloride 0.9 % 100 mL IVPB  Status:  Discontinued  2 g 200 mL/hr over 30 Minutes Intravenous Every 24 hours 01/24/20 1520 01/28/20 1443   01/23/20 2045  cefTRIAXone (ROCEPHIN) 1 g in sodium chloride 0.9 % 100 mL IVPB        1 g 200 mL/hr over 30 Minutes Intravenous  Once 01/23/20 2042 01/23/20 2216     Scheduled Meds: . furosemide      . chlorhexidine gluconate (MEDLINE KIT)  15 mL Mouth Rinse BID  .  Chlorhexidine Gluconate Cloth  6 each Topical Daily  . folic acid  1 mg Per Tube Daily  . heparin  5,000 Units Subcutaneous Q8H  . lactulose  30 g Per Tube BID  . mouth rinse  15 mL Mouth Rinse 10 times per day  . metoprolol tartrate  12.5 mg Per Tube BID  . multivitamin with minerals  1 tablet Per Tube Daily  . pantoprazole (PROTONIX) IV  40 mg Intravenous Q12H  . QUEtiapine  50 mg Per Tube BID  . rifaximin  550 mg Per Tube BID  . sodium chloride flush  10-40 mL Intracatheter Q12H   Continuous Infusions: . sodium chloride 5 mL/hr at 02/02/20 0600  . sodium chloride    . sodium chloride    . sodium chloride    . sodium chloride    . ceFEPime (MAXIPIME) IV Stopped (02/02/20 0545)  . dexmedetomidine (PRECEDEX) IV infusion 1.2 mcg/kg/hr (02/02/20 1056)  . feeding supplement (VITAL HIGH PROTEIN) 1,000 mL (02/01/20 1517)  . fentaNYL infusion INTRAVENOUS 400 mcg/hr (02/02/20 1009)  . midazolam Stopped (02/01/20 1039)  . phenylephrine (NEO-SYNEPHRINE) Adult infusion 25 mcg/min (02/02/20 1028)  . thiamine injection 500 mg (02/02/20 0924)   PRN Meds:.sodium chloride, acetaminophen **OR** acetaminophen, fentaNYL, midazolam, ondansetron **OR** ondansetron (ZOFRAN) IV, sodium chloride flush, traZODone   Microbiology: Results for orders placed or performed during the hospital encounter of 01/23/20  SARS Coronavirus 2 by RT PCR (hospital order, performed in [facility] hospital lab) Nasopharyngeal Nasopharyngeal Swab     Status: None   Collection Time: 01/23/20  7:35 PM   Specimen: Nasopharyngeal Swab  Result Value Ref Range Status   SARS Coronavirus 2 NEGATIVE NEGATIVE Final    Comment: (NOTE) SARS-CoV-2 target nucleic acids are NOT DETECTED.  The SARS-CoV-2 RNA is generally detectable in upper and lower respiratory specimens during the acute phase of infection. The lowest concentration of SARS-CoV-2 viral copies this assay can detect is 250 copies / mL. A negative result does not  preclude SARS-CoV-2 infection and should not be used as the sole basis for treatment or other patient management decisions.  A negative result may occur with improper specimen collection / handling, submission of specimen other than nasopharyngeal swab, presence of viral mutation(s) within the areas targeted by this assay, and inadequate number of viral copies (<250 copies / mL). A negative result must be combined with clinical observations, patient history, and epidemiological information.  Fact Sheet for Patients:   BoilerBrush.com.cy  Fact Sheet for Healthcare Providers: https://pope.com/  This test is not yet approved or  cleared by the [place] FDA and has been authorized for detection and/or diagnosis of SARS-CoV-2 by FDA under an Emergency Use Authorization (EUA).  This EUA will remain in effect (meaning this test can be used) for the duration of the COVID-19 declaration under Section 564(b)(1) of the Act, 21 U.S.C. section 360bbb-3(b)(1), unless the authorization is terminated or revoked sooner.  Performed at [facility], [address] [place], [place] [address]   CULTURE, BLOOD (ROUTINE  X 2) w Reflex to ID Panel     Status: None   Collection Time: 01/24/20  4:15 PM   Specimen: BLOOD  Result Value Ref Range Status   Specimen Description BLOOD RIGHT ANTECUBITAL  Final   Special Requests   Final    BOTTLES DRAWN AEROBIC AND ANAEROBIC Blood Culture adequate volume   Culture   Final    NO GROWTH 5 DAYS Performed at [facility], [address] [place], [place] [address]    Report Status 01/29/2020 FINAL  Final  CULTURE, BLOOD (ROUTINE X 2) w Reflex to ID Panel     Status: None   Collection Time: 01/24/20  4:15 PM   Specimen: BLOOD  Result Value Ref Range Status   Specimen Description BLOOD LEFT ANTECUBITAL  Final   Special Requests   Final    BOTTLES DRAWN AEROBIC AND ANAEROBIC Blood Culture  adequate volume   Culture   Final    NO GROWTH 5 DAYS Performed at [facility], [address] [place], [place] [address]    Report Status 01/29/2020 FINAL  Final  MRSA PCR Screening     Status: None   Collection Time: 01/28/20  2:19 PM   Specimen: Nasal Mucosa; Nasopharyngeal  Result Value Ref Range Status   MRSA by PCR NEGATIVE NEGATIVE Final    Comment:        The GeneXpert MRSA Assay (FDA approved for NASAL specimens only), is one component of a comprehensive MRSA colonization surveillance program. It is not intended to diagnose MRSA infection nor to guide or monitor treatment for MRSA infections. Performed at [facility], [address] [place],  [address]    Results for orders placed or performed during the hospital encounter of 01/23/20 (from the past 24 hour(s))  Hemoglobin and hematocrit, blood     Status: Abnormal   Collection Time: 02/01/20  2:39 PM  Result Value Ref Range   Hemoglobin 9.3 (L) 13.0 - 17.0 g/dL   HCT 28.9 (L) 39 - 52 %  Glucose, capillary     Status: Abnormal   Collection Time: 02/01/20  3:44 PM  Result Value Ref Range   Glucose-Capillary 102 (H) 70 - 99 mg/dL  Glucose, capillary     Status: None   Collection Time: 02/01/20  7:46 PM  Result Value Ref Range   Glucose-Capillary 95 70 - 99 mg/dL  Hemoglobin and hematocrit, blood     Status: Abnormal   Collection Time: 02/01/20  9:09 PM  Result Value Ref Range   Hemoglobin 9.4 (L) 13.0 - 17.0 g/dL   HCT 30.2 (L) 39 - 52 %  Glucose, capillary     Status: None   Collection Time: 02/01/20 11:49 PM  Result Value Ref Range   Glucose-Capillary 97 70 - 99 mg/dL  Glucose, capillary     Status: None   Collection Time: 02/02/20  3:25 AM  Result Value Ref Range   Glucose-Capillary 98 70 - 99 mg/dL  Magnesium     Status: None   Collection Time: 02/02/20  5:00 AM  Result Value Ref Range   Magnesium 1.9 1.7 - 2.4 mg/dL  Phosphorus     Status: None   Collection Time:  02/02/20  5:00 AM  Result Value Ref Range   Phosphorus 3.0 2.5 - 4.6 mg/dL  CBC with Differential/Platelet     Status: Abnormal   Collection Time: 02/02/20  5:00 AM  Result Value Ref Range   WBC 8.2 4.0 -  10.5 K/uL   RBC 2.94 (L) 4.22 - 5.81 MIL/uL   Hemoglobin 9.6 (L) 13.0 - 17.0 g/dL   HCT 94.4 (L) 39 - 52 %   MCV 101.4 (H) 80.0 - 100.0 fL   MCH 32.7 26.0 - 34.0 pg   MCHC 32.2 30.0 - 36.0 g/dL   RDW 46.1 (H) 90.1 - 22.2 %   Platelets 153 150 - 400 K/uL   nRBC 0.6 (H) 0.0 - 0.2 %   Neutrophils Relative % 64 %   Neutro Abs 5.2 1.7 - 7.7 K/uL   Lymphocytes Relative 15 %   Lymphs Abs 1.3 0.7 - 4.0 K/uL   Monocytes Relative 13 %   Monocytes Absolute 1.1 (H) 0 - 1 K/uL   Eosinophils Relative 5 %   Eosinophils Absolute 0.4 0 - 0 K/uL   Basophils Relative 1 %   Basophils Absolute 0.1 0 - 0 K/uL   WBC Morphology MILD LEFT SHIFT (1-5% METAS, OCC MYELO, OCC BANDS)    RBC Morphology MIXED RBC POPULATION    Smear Review Normal platelet morphology    Immature Granulocytes 2 %   Abs Immature Granulocytes 0.15 (H) 0.00 - 0.07 K/uL  Comprehensive metabolic panel     Status: Abnormal   Collection Time: 02/02/20  5:00 AM  Result Value Ref Range   Sodium 144 135 - 145 mmol/L   Potassium 4.4 3.5 - 5.1 mmol/L   Chloride 111 98 - 111 mmol/L   CO2 26 22 - 32 mmol/L   Glucose, Bld 116 (H) 70 - 99 mg/dL   BUN 46 (H) 6 - 20 mg/dL   Creatinine, Ser 4.11 0.61 - 1.24 mg/dL   Calcium 7.8 (L) 8.9 - 10.3 mg/dL   Total Protein 6.2 (L) 6.5 - 8.1 g/dL   Albumin 2.3 (L) 3.5 - 5.0 g/dL   AST 464 (H) 15 - 41 U/L   ALT 49 (H) 0 - 44 U/L   Alkaline Phosphatase 69 38 - 126 U/L   Total Bilirubin 8.8 (H) 0.3 - 1.2 mg/dL   GFR calc non Af Amer >60 >60 mL/min   GFR calc Af Amer >60 >60 mL/min   Anion gap 7 5 - 15  Glucose, capillary     Status: Abnormal   Collection Time: 02/02/20  7:25 AM  Result Value Ref Range   Glucose-Capillary 119 (H) 70 - 99 mg/dL  Glucose, capillary     Status: Abnormal    Collection Time: 02/02/20 11:09 AM  Result Value Ref Range   Glucose-Capillary 104 (H) 70 - 99 mg/dL     Best Practice/Protocols:  VTE Prophylaxis: Heparin (SQ) GI Prophylaxis: Proton Pump Inhibitor Continous Sedation CIWA  Events: 9/6 transferred to ICU for increased WOB, using accessory muscles to breathe with liver failure 9/7 remains on vent, liver failure, PICC line placed, brother updated 9/8 remains encephalopathic, liver failure, resp failure 9/10 remains on ventilator.  Long discussion and update with brother.  Sedatives switch to Precedex and fentanyl, continue CIWA scale 9/11 still with some agitation when sedation is lightened.  Intolerance to SBT due to agitation.  Studies: DG Abd 1 View  Result Date: 01/28/2020 CLINICAL DATA:  Orogastric tube placement EXAM: ABDOMEN - 1 VIEW COMPARISON:  None. FINDINGS: A nasogastric tube is present with side port in the proximal stomach and distal port in the stomach body. Lower thoracic and upper lumbar spondylosis. Mild vascular accentuation in the lung bases. IMPRESSION: 1. Nasogastric tube is present with side port in the  proximal stomach body. 2. Mild vascular accentuation in the lung bases. Electronically Signed   By: Van Clines M.D.   On: 01/28/2020 15:04   CT PELVIS WO CONTRAST  Result Date: 01/24/2020 CLINICAL DATA:  Pain, question fracture EXAM: CT PELVIS WITHOUT CONTRAST TECHNIQUE: Multidetector CT imaging of the pelvis was performed following the standard protocol without intravenous contrast. COMPARISON:  None. FINDINGS: Urinary Tract: The visualized distal ureters and bladder appear unremarkable. Bowel: No bowel wall thickening, distention or surrounding inflammation identified within the pelvis. Vascular/Lymphatic: No enlarged pelvic lymph nodes identified. No significant vascular findings. Reproductive: The prostate is unremarkable. Other: Bilateral subcutaneous edema seen around the hips. Musculoskeletal: No fracture or  dislocation. 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 There is a large heterogeneous hyperdense areas seen throughout the right gluteal musculature with expansion most notable within the gluteus medius muscle belly, likely intramuscular hematoma. There is also a small subcutaneous hyperdense collections seen overlying the left gluteal musculature, likely superficial hematomas. IMPRESSION: No acute osseous abnormality. Large intramuscular hematoma within the right gluteal musculature. Overlying subcutaneous edema and skin thickening. superficial subcutaneous small hematoma seen overlying the left gluteal musculature. Electronically Signed   By: Prudencio Pair M.D.   On: 01/24/2020 02:45   MR HIP RIGHT WO CONTRAST  Result Date: 01/27/2020 CLINICAL DATA:  Right gluteal hematoma, for further characterization. EXAM: MR OF THE RIGHT HIP WITHOUT CONTRAST TECHNIQUE: Multiplanar, multisequence MR imaging was performed. No intravenous contrast was administered. COMPARISON:  CT pelvis 01/24/2020 FINDINGS: The patient terminated the exam following the completion of coronal T1 and T2 weighted images. These images are adversely affected by motion artifact. The patient did not allow completion of the exam, and due to these factors, diagnostic sensitivity and specificity of the examination is reduced. Bones: Accentuated T2 signal and reduced T1 signal in the pubic bodies bilaterally suspicious for osteitis pubis. No surrounding fluid collection to suggest infection. SI joints grossly unremarkable. No observed fracture. Articular cartilage and labrum Articular cartilage: Difficult to assess given the lack of small field-of-view images and the motion artifact. Labrum: Today's exam is not diagnostic for assessment of the labrum. Joint or bursal effusion Joint effusion:  Absent Bursae: No bursitis. Muscles and tendons Muscles and tendons: Heterogeneous hematoma  primarily in the right gluteus medius muscle, but also tracking up through the sciatic notch along the right piriformis muscle, measuring approximately 22.1 by 13.5 by 8.5 cm (volume = 1300 cm^3). By my estimates this is similar in volume to the prior CT from 01/24/2020, indicating no significant change over the 12 hours between the 2 studies. The hematoma appears to moderately impinge on the right sciatic nerve at and distal to the sciatic notch. Low-level asymmetric edema in the right gluteus maximus muscle. Trace edema along the right hip adductor musculature. Other findings Miscellaneous: We partially include apparent hematoma in the subcutaneous tissues lateral to the left gluteus musculature, better shown on the CT examination. There is subcutaneous edema along both buttock and flank regions. Mild edema in the retroperitoneum, presacral region, and along the right pelvic sidewall. IMPRESSION: 1. Heterogeneous hematoma primarily in the right gluteus medius muscle, but also tracking up through the sciatic notch along the right piriformis muscle. Similar volume to prior CT about 1300 cubic cm. The hematoma appears to moderately impinge on the right sciatic nerve at and distal to the sciatic notch. 2. Low-level edema in the right gluteus maximus and right hip adductor musculature. 3. Subcutaneous  edema along both buttock and flank regions. 4. 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 5. Subcutaneous hematoma lateral to the left gluteal musculature, better shown on the prior CT. 6. Mild edema in the retroperitoneum, presacral region, and along the right pelvic sidewall. 7. The patient terminated the exam following the completion of the exam, and due to motion artifact. The patient did not allow completion of the exam, and due to these factors, diagnostic sensitivity and specificity of the examination is reduced. Electronically Signed   By: Van Clines M.D.   On: 01/27/2020 15:44   DG Chest Port 1 View  Result Date:  02/01/2020 CLINICAL DATA:  Acute respiratory failure EXAM: PORTABLE CHEST 1 VIEW COMPARISON:  01/28/2020 FINDINGS: Endotracheal and enteric tubes are unchanged in position. Shallow inspiration. Cardiac enlargement with pulmonary vascular congestion. No definite edema or consolidation. No pleural effusions. No pneumothorax. IMPRESSION: Cardiac enlargement with pulmonary vascular congestion. No edema or consolidation. Electronically Signed   By: Lucienne Capers M.D.   On: 02/01/2020 01:58   DG Chest Port 1 View  Result Date: 01/28/2020 CLINICAL DATA:  Endotracheal tube placement for respiratory failure. Acute toxic metabolic encephalopathy. Acute liver failure complicated by rhabdomyolysis. EXAM: PORTABLE CHEST 1 VIEW COMPARISON:  None. FINDINGS: Endotracheal tube tip is 2.7 cm above the carina, satisfactorily position. Nasogastric tube side port is in the vicinity of the gastric cardia with distal port in the stomach body. Mild enlargement of the cardiopericardial silhouette noted with indistinct pulmonary vasculature and cephalization of blood flow suspicious for pulmonary venous hypertension. Borderline appearance for interstitial edema. IMPRESSION: 1. Endotracheal tube tip is 2.7 cm above the carina, satisfactorily positioned. 2. Mild enlargement of the cardiopericardial silhouette with suspected pulmonary venous hypertension and borderline appearance for interstitial edema. Electronically Signed   By: Van Clines M.D.   On: 01/28/2020 15:08   DG Hip Unilat W or Wo Pelvis 2-3 Views Right  Result Date: 01/23/2020 CLINICAL DATA:  Right hip pain post fall EXAM: DG HIP (WITH OR WITHOUT PELVIS) 2-3V RIGHT COMPARISON:  None. FINDINGS: There is marked lateral soft tissue swelling of the right hip. Suspicious for overlying contusive change and/or hematoma. There is a questionable lucency along the posterolateral aspect of the right femoral head neck junction which could reflect a nondisplaced fracture or  some projectional periarticular spurring. Additional suspicious lucency is seen along the junction of the left inferior pubic ramus and pubic body best seen on the lateral hip radiograph. Could correlate for point tenderness in this location. There is a background of diffuse degenerative change in the lower spine, hips and pelvis. Enthesopathic changes noted as well at the iliac crest, ischial tuberosities and lesser trochanters bilaterally. IMPRESSION: 1. Marked lateral soft tissue swelling of the right hip. Suspicious for overlying contusive change and/or hematoma. 2. Questionable lucency along the posterolateral aspect of the right femoral head neck junction which could reflect a nondisplaced fracture or some projectional periarticular spurring. 3. Additional suspicious lucency along the junction of the left inferior pubic ramus and pubic body best seen on the lateral radiograph. 4. Given these latter findings, further evaluation with a bony pelvis CT could be obtained for more definitive assessment. Electronically Signed   By: Lovena Le M.D.   On: 01/23/2020 21:55   [place] EKG SITE RITE  Result Date: 01/28/2020 If Site Rite image not attached, placement could not be confirmed due to current cardiac rhythm.  US Abdomen Limited RUQ  Result Date: 01/23/2020 CLINICAL DATA:  Elevated liver enzymes EXAM: ULTRASOUND ABDOMEN LIMITED  RIGHT UPPER QUADRANT COMPARISON:  None. FINDINGS: Gallbladder: No gallstones or wall thickening visualized. No sonographic Murphy sign noted by sonographer. Common bile duct: Diameter: 5.9 mm Liver: Increased echogenicity seen throughout the liver parenchyma. No focal hepatic lesion. No biliary ductal dilatation. The portal vein is patent, with reversal of flow however and recanalized umbilical vein. Other: None. IMPRESSION: Hepatic steatosis Reversal of flow in the portal vein with findings suggestive of portal hypertension Electronically Signed   By: Prudencio Pair M.D.   On: 01/23/2020  21:21    Consults: Treatment Team:  Leim Fabry, MD   Subjective:    Overnight Issues: Remains on the ventilator, agitation on SAT today.  Did tolerate SBT some.  Volume overload, evident.  Objective:  Vital signs for last 24 hours: Temp:  [98 F (36.7 C)-99.8 F (37.7 C)] 99.6 F (37.6 C) (09/11 0800) Pulse Rate:  [66-110] 68 (09/11 1100) Resp:  [18-30] 21 (09/11 1100) BP: (91-128)/(50-88) 113/66 (09/11 1100) SpO2:  [91 %-100 %] 95 % (09/11 1100) FiO2 (%):  [28 %] 28 % (09/11 0900) Weight:  [120.2 kg] 120.2 kg (09/11 0500)  Hemodynamic parameters for last 24 hours:    Intake/Output from previous day: 09/10 0701 - 09/11 0700 In: 3026.8 [I.V.:2988.4; IV Piggyback:38.3] Out: 865 [Urine:865]  Intake/Output this shift: Total I/O In: 502.1 [I.V.:402.1; IV Piggyback:100] Out: 250 [Urine:50; Stool:200]  Vent settings for last 24 hours: Vent Mode: PRVC FiO2 (%):  [28 %] 28 % Set Rate:  [20 bmp] 20 bmp Vt Set:  [500 mL] 500 mL PEEP:  [5 cmH20] 5 cmH20 Pressure Support:  [5 cmH20] 5 cmH20  Physical Exam:  GENERAL: Acutely ill-appearing male, intubated mechanically ventilated.  Synchronous with the vent. HEAD: Normocephalic, atraumatic.  EYES: Pupils equal, round, reactive to light.  Scleral icterus noted less pronounced.  MOUTH: Orotracheally intubated, OG in place. NECK: Supple. No thyromegaly. Trachea midline. No JVD.  No adenopathy. PULMONARY: Good air entry bilaterally.  Coarse breath sounds otherwise no other adventitious sounds. CARDIOVASCULAR: S1 and S2. Regular rate and rhythm.  No rubs, murmurs or gallops heard. ABDOMEN: Obese, soft, nondistended.  Normoactive bowel sounds.  Tolerating tube feeds. MUSCULOSKELETAL: No joint deformity, no clubbing, 1+ anasarca. NEUROLOGIC: Agitated when sedation decreased, no overt focal deficit noted.  Cannot make further assessment. SKIN: Intact,warm,dry.  Multiple ecchymotic areas notably on left neck, bilateral hips, lower  back, lower extremities. PSYCH: Unable to assess due to mechanically ventilated status.  Assessment/Plan:   Acute respiratory failure with hypoxia Due to altered mental status/EtOH withdrawal Encephalopathy from liver failure Continue supportive care with ventilator Patient on minimal settings Mental status is limitation to extubation Reassess for SAT/SBT daily Continue Precedex, fentanyl and CIWA  Acute toxic metabolic encephalopathy EtOH withdrawal Hepatic dysfunction Supportive care Thiamine IV, high-dose Sedation as above Continue CIWA scale Ammonia trending up Lactulose twice daily  Acute alcoholic hepatitis Hepatic encephalopathy Supportive care Continue nutrition Trend LFTs Trend ammonia and treat as needed Rifaximin 550 mg via OG twice daily Lactulose twice daily (standing dose rather than PRN)  Rhabdomyolysis Supportive care Improving Continue to trend CK Check urine myoglobin and UA  Hematoma due to trauma Reabsorption of blood will also cause hepatic dysfunction Continue to monitor  Volume overload Lasix x1  Nutrition Status: Nutrition Problem: Inadequate oral intake Etiology: inability to eat Signs/Symptoms: NPO status Interventions: Tube feeding, Prostat, MVI     LOS: 10 days   Additional comments: Discussed during multidisciplinary rounds.  I apprised patient's brother at bedside.  All questions and concerns were answered/addressed to his satisfaction.   Critical Care Total Time*: 40  Minutes  C. Derrill Kay, MD [facility] 02/02/2020  *Care during the described time interval was provided by me and/or other providers on the critical care team.  I have reviewed this patient's available data, including medical history, events of note, physical examination and test results as part of my evaluation.

## 2020-02-02 NOTE — Progress Notes (Signed)
Pt extubated at this time. Remains on precedex gtt. Brother at bedside.

## 2020-02-02 NOTE — Progress Notes (Signed)
Pt resting in bed quietly. Pt required Ativan about 1 hour ago due to anxiety and attempting to get out of bed. Precedex gtt infusing as well. Pt is alert and oriented to self but otherwise disoriented. Pt is on 2L  after extubation this morning. O2 sats stable. Neo gtt has been weaned throughout day. VSS. Brother is at bedside.

## 2020-02-03 ENCOUNTER — Inpatient Hospital Stay: Payer: Self-pay

## 2020-02-03 ENCOUNTER — Encounter: Payer: Self-pay | Admitting: Family Medicine

## 2020-02-03 DIAGNOSIS — F10231 Alcohol dependence with withdrawal delirium: Secondary | ICD-10-CM

## 2020-02-03 LAB — BASIC METABOLIC PANEL
Anion gap: 5 (ref 5–15)
BUN: 42 mg/dL — ABNORMAL HIGH (ref 6–20)
CO2: 28 mmol/L (ref 22–32)
Calcium: 8.1 mg/dL — ABNORMAL LOW (ref 8.9–10.3)
Chloride: 112 mmol/L — ABNORMAL HIGH (ref 98–111)
Creatinine, Ser: 1.03 mg/dL (ref 0.61–1.24)
GFR calc Af Amer: >60 mL/min (ref >60)
GFR calc non Af Amer: >60 mL/min (ref >60)
Glucose, Bld: 114 mg/dL — ABNORMAL HIGH (ref 70–99)
Potassium: 4.6 mmol/L (ref 3.5–5.1)
Sodium: 145 mmol/L (ref 135–145)

## 2020-02-03 LAB — HEMOGLOBIN AND HEMATOCRIT, BLOOD
HCT: 32.6 % — ABNORMAL LOW (ref 39.0–52.0)
HCT: 30.2 % — ABNORMAL LOW (ref 39.0–52.0)
HCT: 28.7 % — ABNORMAL LOW (ref 39.0–52.0)
HCT: 29.9 % — ABNORMAL LOW (ref 39.0–52.0)
Hemoglobin: 10.3 g/dL — ABNORMAL LOW (ref 13.0–17.0)
Hemoglobin: 9.9 g/dL — ABNORMAL LOW (ref 13.0–17.0)
Hemoglobin: 9.1 g/dL — ABNORMAL LOW (ref 13.0–17.0)
Hemoglobin: 9.6 g/dL — ABNORMAL LOW (ref 13.0–17.0)

## 2020-02-03 LAB — TYPE AND SCREEN
ABO/RH(D): O POS
Antibody Screen: NEGATIVE
Unit division: 0

## 2020-02-03 LAB — PHOSPHORUS: Phosphorus: 3 mg/dL (ref 2.5–4.6)

## 2020-02-03 LAB — MAGNESIUM: Magnesium: 1.8 mg/dL (ref 1.7–2.4)

## 2020-02-03 LAB — GLUCOSE, CAPILLARY
Glucose-Capillary: 100 mg/dL — ABNORMAL HIGH (ref 70–99)
Glucose-Capillary: 101 mg/dL — ABNORMAL HIGH (ref 70–99)
Glucose-Capillary: 107 mg/dL — ABNORMAL HIGH (ref 70–99)
Glucose-Capillary: 110 mg/dL — ABNORMAL HIGH (ref 70–99)
Glucose-Capillary: 99 mg/dL (ref 70–99)

## 2020-02-03 LAB — BPAM RBC
Blood Product Expiration Date: 202110112359
ISSUE DATE / TIME: 202109101253
Unit Type and Rh: 5100

## 2020-02-03 MED ORDER — SODIUM CHLORIDE 0.9% FLUSH
10.0000 mL | Freq: Two times a day (BID) | INTRAVENOUS | Status: DC
  Administered 2020-02-04 (×2): 10 mL
  Administered 2020-02-05: 20 mL
  Administered 2020-02-06 – 2020-02-07 (×3): 10 mL
  Administered 2020-02-08: 20 mL
  Administered 2020-02-08 – 2020-02-09 (×3): 10 mL
  Administered 2020-02-10: 20 mL
  Administered 2020-02-10 – 2020-02-14 (×9): 10 mL

## 2020-02-03 MED ORDER — SODIUM CHLORIDE 0.9% FLUSH
10.0000 mL | INTRAVENOUS | Status: DC | PRN
  Administered 2020-02-09 – 2020-02-10 (×2): 10 mL

## 2020-02-03 MED ORDER — HALOPERIDOL LACTATE 5 MG/ML IJ SOLN
2.0000 mg | Freq: Four times a day (QID) | INTRAMUSCULAR | Status: DC | PRN
  Administered 2020-02-04 – 2020-02-09 (×2): 2 mg via INTRAVENOUS
  Filled 2020-02-03 (×2): qty 1

## 2020-02-03 MED ORDER — HALOPERIDOL LACTATE 5 MG/ML IJ SOLN: 5.0000 mg | Freq: Once | INTRAMUSCULAR | Status: AC

## 2020-02-03 MED ORDER — HALOPERIDOL LACTATE 5 MG/ML IJ SOLN
INTRAMUSCULAR | Status: AC
  Administered 2020-02-03: 5 mg via INTRAMUSCULAR
  Filled 2020-02-03: qty 1

## 2020-02-03 MED ORDER — HYDROXYZINE HCL 50 MG/ML IM SOLN
25.0000 mg | INTRAMUSCULAR | Status: DC | PRN
  Administered 2020-02-03: 25 mg via INTRAMUSCULAR
  Filled 2020-02-03 (×2): qty 0.5

## 2020-02-03 MED ORDER — LORAZEPAM 2 MG/ML IJ SOLN
2.0000 mg | Freq: Once | INTRAMUSCULAR | Status: AC
  Administered 2020-02-03: 2 mg via INTRAMUSCULAR

## 2020-02-03 NOTE — Progress Notes (Signed)
Follow up - Critical Care Medicine Note  Patient Details:    [name] is an 49 y.o. male with a known history of polysubstance abuse including alcohol abuse who presented to the emergency room with acute onset of increased generalized weakness and amechanicalfall. Alcohol level 296 on admission.  Patient was transferred to ICU on 6 September due to increased work of breathing and accessory muscle use.  He was withdrawing from alcohol required intubation and mechanical ventilation.  Lines, Airways, Drains: Airway 8 mm (Active)  Secured at (cm) 24 cm 02/01/20 1555  Measured From Lips 02/01/20 [place] 02/01/20 1555  Secured By [employer] 02/01/20 1555  Tube Holder Repositioned Yes 02/01/20 1413  Cuff Pressure (cm H2O) 28 cm H2O 02/01/20 1413  Site Condition Dry 02/01/20 1555     PICC Triple Lumen 01/28/20 PICC Right Cephalic 39 cm 0 cm (Active)  Indication for Insertion or Continuance of Line Prolonged intravenous therapies 02/01/20 0800  Exposed Catheter (cm) 0 cm 01/28/20 1817  Site Assessment Clean;Dry;Intact 02/01/20 0800  Lumen #1 Status Infusing 02/01/20 0800  Lumen #2 Status Infusing 01/31/20 1930  Lumen #3 Status Flushed;Saline locked 02/01/20 0800  Dressing Type Transparent;Occlusive 02/01/20 0800  Dressing Status Clean;Dry;Intact;Antimicrobial disc in place 02/01/20 0800  Safety Lock Not Applicable 19/37/90 2409  Line Care Connections checked and tightened 02/01/20 0800  Line Adjustment (NICU/IV Team Only) No 01/28/20 1817  Dressing Intervention New dressing 01/28/20 1817  Dressing Change Due 02/04/20 02/01/20 0800     NG/OG Tube Center mouth Xray Documented cm marking at nare/ corner of mouth 52 cm (Active)  Cm Marking at Nare/Corner of Mouth (if applicable) 52 cm 73/53/29 1555  Site Assessment Clean;Dry;Intact 02/01/20 1555  Ongoing Placement Verification No change in cm markings or external length of tube from initial placement  02/01/20 1555  Status Infusing tube feed 02/01/20 1555  Amount of suction 80 mmHg 01/31/20 1930  Drainage Appearance Bile;Green 01/31/20 1930  Intake (mL) 150 mL 01/29/20 1700     Rectal Tube/Pouch (Active)  Output (mL) 200 mL 01/30/20 2000     Urethral Catheter [name] Latex (Active)  Indication for Insertion or Continuance of Catheter Therapy based on hourly urine output monitoring and documentation for critical condition (NOT STRICT I&O) 02/01/20 0805  Site Assessment Clean;Intact;Dry 02/01/20 0805  Catheter Maintenance Bag below level of bladder;Catheter secured;Drainage bag/tubing not touching floor;Insertion date on drainage bag;No dependent loops;Seal intact 02/01/20 0805  Collection Container Standard drainage bag 02/01/20 0805  Securement Method Securing device (Describe) 02/01/20 0805  Urinary Catheter Interventions (if applicable) Unclamped 92/42/68 0805  Output (mL) 150 mL 02/01/20 1400    Anti-infectives:  Anti-infectives (From admission, onward)   Start     Dose/Rate Route Frequency Ordered Stop   02/02/20 2200  rifaximin (XIFAXAN) tablet 550 mg        550 mg Per Tube 2 times daily 02/02/20 0928     01/29/20 1530  rifaximin (XIFAXAN) tablet 550 mg  Status:  Discontinued        550 mg Oral 2 times daily 01/29/20 1500 02/02/20 0928   01/28/20 1515  ceFEPIme (MAXIPIME) 2 g in sodium chloride 0.9 % 100 mL IVPB        2 g 200 mL/hr over 30 Minutes Intravenous Every 8 hours 01/28/20 1504 02/04/20 1359   01/24/20 1600  cefTRIAXone (ROCEPHIN) 2 g in sodium chloride 0.9 % 100 mL IVPB  Status:  Discontinued  2 g 200 mL/hr over 30 Minutes Intravenous Every 24 hours 01/24/20 1520 01/28/20 1443   01/23/20 2045  cefTRIAXone (ROCEPHIN) 1 g in sodium chloride 0.9 % 100 mL IVPB        1 g 200 mL/hr over 30 Minutes Intravenous  Once 01/23/20 2042 01/23/20 2216     Scheduled Meds: . chlorhexidine gluconate (MEDLINE KIT)  15 mL Mouth Rinse BID  . Chlorhexidine  Gluconate Cloth  6 each Topical Daily  . folic acid  1 mg Per Tube Daily  . heparin  5,000 Units Subcutaneous Q8H  . lactulose  30 g Per Tube BID  . metoprolol tartrate  12.5 mg Per Tube BID  . multivitamin with minerals  1 tablet Per Tube Daily  . pantoprazole (PROTONIX) IV  40 mg Intravenous Q12H  . QUEtiapine  50 mg Per Tube BID  . rifaximin  550 mg Per Tube BID  . sodium chloride flush  10-40 mL Intracatheter Q12H   Continuous Infusions: . sodium chloride 5 mL/hr at 02/02/20 0600  . sodium chloride    . ceFEPime (MAXIPIME) IV 2 g (02/03/20 1526)  . dexmedetomidine (PRECEDEX) IV infusion Stopped (02/03/20 1400)  . phenylephrine (NEO-SYNEPHRINE) Adult infusion 15 mcg/min (02/02/20 2130)  . thiamine injection 500 mg (02/03/20 1045)   PRN Meds:.sodium chloride, acetaminophen **OR** acetaminophen, LORazepam, midazolam, ondansetron **OR** ondansetron (ZOFRAN) IV, sodium chloride flush, traZODone   Microbiology: Results for orders placed or performed during the hospital encounter of 01/23/20  SARS Coronavirus 2 by RT PCR (hospital order, performed in [facility] hospital lab) Nasopharyngeal Nasopharyngeal Swab     Status: None   Collection Time: 01/23/20  7:35 PM   Specimen: Nasopharyngeal Swab  Result Value Ref Range Status   SARS Coronavirus 2 NEGATIVE NEGATIVE Final    Comment: (NOTE) SARS-CoV-2 target nucleic acids are NOT DETECTED.  The SARS-CoV-2 RNA is generally detectable in upper and lower respiratory specimens during the acute phase of infection. The lowest concentration of SARS-CoV-2 viral copies this assay can detect is 250 copies / mL. A negative result does not preclude SARS-CoV-2 infection and should not be used as the sole basis for treatment or other patient management decisions.  A negative result may occur with improper specimen collection / handling, submission of specimen other than nasopharyngeal swab, presence of viral mutation(s) within the areas targeted  by this assay, and inadequate number of viral copies (<250 copies / mL). A negative result must be combined with clinical observations, patient history, and epidemiological information.  Fact Sheet for Patients:   StrictlyIdeas.no  Fact Sheet for Healthcare Providers: BankingDealers.co.za  This test is not yet approved or  cleared by the [place] FDA and has been authorized for detection and/or diagnosis of SARS-CoV-2 by FDA under an Emergency Use Authorization (EUA).  This EUA will remain in effect (meaning this test can be used) for the duration of the COVID-19 declaration under Section 564(b)(1) of the Act, 21 U.S.C. section 360bbb-3(b)(1), unless the authorization is terminated or revoked sooner.  Performed at [facility], [address] [place], [place] [address]   CULTURE, BLOOD (ROUTINE X 2) w Reflex to ID Panel     Status: None   Collection Time: 01/24/20  4:15 PM   Specimen: BLOOD  Result Value Ref Range Status   Specimen Description BLOOD RIGHT ANTECUBITAL  Final   Special Requests   Final    BOTTLES DRAWN AEROBIC AND ANAEROBIC Blood Culture adequate volume   Culture  Final    NO GROWTH 5 DAYS Performed at [facility], [address] [place], [place] [address]    Report Status 01/29/2020 FINAL  Final  CULTURE, BLOOD (ROUTINE X 2) w Reflex to ID Panel     Status: None   Collection Time: 01/24/20  4:15 PM   Specimen: BLOOD  Result Value Ref Range Status   Specimen Description BLOOD LEFT ANTECUBITAL  Final   Special Requests   Final    BOTTLES DRAWN AEROBIC AND ANAEROBIC Blood Culture adequate volume   Culture   Final    NO GROWTH 5 DAYS Performed at [facility], [address] [place], [place] [address]    Report Status 01/29/2020 FINAL  Final  MRSA PCR Screening     Status: None   Collection Time: 01/28/20  2:19 PM   Specimen: Nasal Mucosa; Nasopharyngeal  Result Value Ref  Range Status   MRSA by PCR NEGATIVE NEGATIVE Final    Comment:        The GeneXpert MRSA Assay (FDA approved for NASAL specimens only), is one component of a comprehensive MRSA colonization surveillance program. It is not intended to diagnose MRSA infection nor to guide or monitor treatment for MRSA infections. Performed at [facility], [address] [place], [place] [address]    Results for orders placed or performed during the hospital encounter of 01/23/20 (from the past 24 hour(s))  Glucose, capillary     Status: Abnormal   Collection Time: 02/02/20  8:10 PM  Result Value Ref Range   Glucose-Capillary 109 (H) 70 - 99 mg/dL  Hemoglobin and hematocrit, blood     Status: Abnormal   Collection Time: 02/02/20  9:11 PM  Result Value Ref Range   Hemoglobin 9.9 (L) 13.0 - 17.0 g/dL   HCT 31.4 (L) 39 - 52 %  Glucose, capillary     Status: Abnormal   Collection Time: 02/03/20 12:11 AM  Result Value Ref Range   Glucose-Capillary 110 (H) 70 - 99 mg/dL  Magnesium     Status: None   Collection Time: 02/03/20 12:14 AM  Result Value Ref Range   Magnesium 1.8 1.7 - 2.4 mg/dL  Phosphorus     Status: None   Collection Time: 02/03/20 12:14 AM  Result Value Ref Range   Phosphorus 3.0 2.5 - 4.6 mg/dL  Basic metabolic panel     Status: Abnormal   Collection Time: 02/03/20 12:14 AM  Result Value Ref Range   Sodium 145 135 - 145 mmol/L   Potassium 4.6 3.5 - 5.1 mmol/L   Chloride 112 (H) 98 - 111 mmol/L   CO2 28 22 - 32 mmol/L   Glucose, Bld 114 (H) 70 - 99 mg/dL   BUN 42 (H) 6 - 20 mg/dL   Creatinine, Ser 1.03 0.61 - 1.24 mg/dL   Calcium 8.1 (L) 8.9 - 10.3 mg/dL   GFR calc non Af Amer >60 >60 mL/min   GFR calc Af Amer >60 >60 mL/min   Anion gap 5 5 - 15  Glucose, capillary     Status: None   Collection Time: 02/03/20  3:39 AM  Result Value Ref Range   Glucose-Capillary 99 70 - 99 mg/dL  Hemoglobin and hematocrit, blood     Status: Abnormal   Collection Time: 02/03/20   4:06 AM  Result Value Ref Range   Hemoglobin 10.3 (L) 13.0 - 17.0 g/dL   HCT 32.6 (L) 39 - 52 %  Hemoglobin and hematocrit,  blood     Status: Abnormal   Collection Time: 02/03/20  1:17 PM  Result Value Ref Range   Hemoglobin 9.9 (L) 13.0 - 17.0 g/dL   HCT 30.2 (L) 39 - 52 %  Hemoglobin and hematocrit, blood     Status: Abnormal   Collection Time: 02/03/20  3:34 PM  Result Value Ref Range   Hemoglobin 9.1 (L) 13.0 - 17.0 g/dL   HCT 28.7 (L) 39 - 52 %  Glucose, capillary     Status: Abnormal   Collection Time: 02/03/20  4:20 PM  Result Value Ref Range   Glucose-Capillary 101 (H) 70 - 99 mg/dL     Best Practice/Protocols:  VTE Prophylaxis: Heparin (SQ) GI Prophylaxis: Proton Pump Inhibitor Continous Sedation CIWA  Events: 9/6 transferred to ICU for increased WOB, using accessory muscles to breathe with liver failure 9/7 remains on vent, liver failure, PICC line placed, brother updated 9/8 remains encephalopathic, liver failure, resp failure 9/10 remains on ventilator.  Long discussion and update with brother.  Sedatives switch to Precedex and fentanyl, continue CIWA scale 9/11 still with some agitation when sedation is lightened.  Intolerance to SBT due to agitation 9/12 extubated yesterday afternoon, main issue is ongoing DTs/hallucinations  Studies: DG Abd 1 View  Result Date: 01/28/2020 CLINICAL DATA:  Orogastric tube placement EXAM: ABDOMEN - 1 VIEW COMPARISON:  None. FINDINGS: A nasogastric tube is present with side port in the proximal stomach and distal port in the stomach body. Lower thoracic and upper lumbar spondylosis. Mild vascular accentuation in the lung bases. IMPRESSION: 1. Nasogastric tube is present with side port in the proximal stomach body. 2. Mild vascular accentuation in the lung bases. Electronically Signed   By: Van Clines M.D.   On: 01/28/2020 15:04   CT PELVIS WO CONTRAST  Result Date: 01/24/2020 CLINICAL DATA:  Pain, question fracture EXAM: CT  PELVIS WITHOUT CONTRAST TECHNIQUE: Multidetector CT imaging of the pelvis was performed following the standard protocol without intravenous contrast. COMPARISON:  None. FINDINGS: Urinary Tract: The visualized distal ureters and bladder appear unremarkable. Bowel: No bowel wall thickening, distention or surrounding inflammation identified within the pelvis. Vascular/Lymphatic: No enlarged pelvic lymph nodes identified. No significant vascular findings. Reproductive: The prostate is unremarkable. Other: Bilateral subcutaneous edema seen around the hips. Musculoskeletal: No fracture or dislocation. 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 There is a large heterogeneous hyperdense areas seen throughout the right gluteal musculature with expansion most notable within the gluteus medius muscle belly, likely intramuscular hematoma. There is also a small subcutaneous hyperdense collections seen overlying the left gluteal musculature, likely superficial hematomas. IMPRESSION: No acute osseous abnormality. Large intramuscular hematoma within the right gluteal musculature. Overlying subcutaneous edema and skin thickening. superficial subcutaneous small hematoma seen overlying the left gluteal musculature. Electronically Signed   By: Prudencio Pair M.D.   On: 01/24/2020 02:45   DG Pelvis Portable  Result Date: 02/03/2020 CLINICAL DATA:  Fall. EXAM: PORTABLE PELVIS 1-2 VIEWS COMPARISON:  None. FINDINGS: Hips are located.  No pelvic fracture or sacral fracture. Radiodense ribbon like foreign body projects over the pubic symphysis. IMPRESSION: 1. No pelvic fracture. 2. Radiopaque foreign body projects over the pubic symphysis. Electronically Signed   By: Suzy Bouchard M.D.   On: 02/03/2020 05:49   MR HIP RIGHT WO CONTRAST  Result Date: 01/27/2020 CLINICAL DATA:  Right gluteal hematoma, for further characterization. EXAM: MR OF THE RIGHT HIP WITHOUT  CONTRAST TECHNIQUE: Multiplanar,  multisequence MR imaging was performed. No intravenous contrast was administered. COMPARISON:  CT pelvis 01/24/2020 FINDINGS: The patient terminated the exam following the completion of coronal T1 and T2 weighted images. These images are adversely affected by motion artifact. The patient did not allow completion of the exam, and due to these factors, diagnostic sensitivity and specificity of the examination is reduced. Bones: Accentuated T2 signal and reduced T1 signal in the pubic bodies bilaterally suspicious for osteitis pubis. No surrounding fluid collection to suggest infection. SI joints grossly unremarkable. No observed fracture. Articular cartilage and labrum Articular cartilage: Difficult to assess given the lack of small field-of-view images and the motion artifact. Labrum: Today's exam is not diagnostic for assessment of the labrum. Joint or bursal effusion Joint effusion:  Absent Bursae: No bursitis. Muscles and tendons Muscles and tendons: Heterogeneous hematoma primarily in the right gluteus medius muscle, but also tracking up through the sciatic notch along the right piriformis muscle, measuring approximately 22.1 by 13.5 by 8.5 cm (volume = 1300 cm^3). By my estimates this is similar in volume to the prior CT from 01/24/2020, indicating no significant change over the 12 hours between the 2 studies. The hematoma appears to moderately impinge on the right sciatic nerve at and distal to the sciatic notch. Low-level asymmetric edema in the right gluteus maximus muscle. Trace edema along the right hip adductor musculature. Other findings Miscellaneous: We partially include apparent hematoma in the subcutaneous tissues lateral to the left gluteus musculature, better shown on the CT examination. There is subcutaneous edema along both buttock and flank regions. Mild edema in the retroperitoneum, presacral region, and along the right pelvic sidewall. IMPRESSION: 1.  Heterogeneous hematoma primarily in the right gluteus medius muscle, but also tracking up through the sciatic notch along the right piriformis muscle. Similar volume to prior CT about 1300 cubic cm. The hematoma appears to moderately impinge on the right sciatic nerve at and distal to the sciatic notch. 2. Low-level edema in the right gluteus maximus and right hip adductor musculature. 3. Subcutaneous edema along both buttock and flank regions. 4. 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 5. Subcutaneous hematoma lateral to the left gluteal musculature, better shown on the prior CT. 6. Mild edema in the retroperitoneum, presacral region, and along the right pelvic sidewall. 7. The patient terminated the exam following the completion of the exam, and due to motion artifact. The patient did not allow completion of the exam, and due to these factors, diagnostic sensitivity and specificity of the examination is reduced. Electronically Signed   By: Van Clines M.D.   On: 01/27/2020 15:44   DG Chest Port 1 View  Result Date: 02/01/2020 CLINICAL DATA:  Acute respiratory failure EXAM: PORTABLE CHEST 1 VIEW COMPARISON:  01/28/2020 FINDINGS: Endotracheal and enteric tubes are unchanged in position. Shallow inspiration. Cardiac enlargement with pulmonary vascular congestion. No definite edema or consolidation. No pleural effusions. No pneumothorax. IMPRESSION: Cardiac enlargement with pulmonary vascular congestion. No edema or consolidation. Electronically Signed   By: Lucienne Capers M.D.   On: 02/01/2020 01:58   DG Chest Port 1 View  Result Date: 01/28/2020 CLINICAL DATA:  Endotracheal tube placement for respiratory failure. Acute toxic metabolic encephalopathy. Acute liver failure complicated by rhabdomyolysis. EXAM: PORTABLE CHEST 1 VIEW COMPARISON:  None. FINDINGS: Endotracheal tube tip is 2.7 cm above the carina, satisfactorily position. Nasogastric tube side port is in the vicinity of the gastric cardia with distal port  in the stomach body. Mild enlargement of the cardiopericardial silhouette noted with indistinct  pulmonary vasculature and cephalization of blood flow suspicious for pulmonary venous hypertension. Borderline appearance for interstitial edema. IMPRESSION: 1. Endotracheal tube tip is 2.7 cm above the carina, satisfactorily positioned. 2. Mild enlargement of the cardiopericardial silhouette with suspected pulmonary venous hypertension and borderline appearance for interstitial edema. Electronically Signed   By: Van Clines M.D.   On: 01/28/2020 15:08   DG Hip Unilat W or Wo Pelvis 2-3 Views Right  Result Date: 01/23/2020 CLINICAL DATA:  Right hip pain post fall EXAM: DG HIP (WITH OR WITHOUT PELVIS) 2-3V RIGHT COMPARISON:  None. FINDINGS: There is marked lateral soft tissue swelling of the right hip. Suspicious for overlying contusive change and/or hematoma. There is a questionable lucency along the posterolateral aspect of the right femoral head neck junction which could reflect a nondisplaced fracture or some projectional periarticular spurring. Additional suspicious lucency is seen along the junction of the left inferior pubic ramus and pubic body best seen on the lateral hip radiograph. Could correlate for point tenderness in this location. There is a background of diffuse degenerative change in the lower spine, hips and pelvis. Enthesopathic changes noted as well at the iliac crest, ischial tuberosities and lesser trochanters bilaterally. IMPRESSION: 1. Marked lateral soft tissue swelling of the right hip. Suspicious for overlying contusive change and/or hematoma. 2. Questionable lucency along the posterolateral aspect of the right femoral head neck junction which could reflect a nondisplaced fracture or some projectional periarticular spurring. 3. Additional suspicious lucency along the junction of the left inferior pubic ramus and pubic body best seen on the lateral radiograph. 4. Given these latter  findings, further evaluation with a bony pelvis CT could be obtained for more definitive assessment. Electronically Signed   By: Lovena Le M.D.   On: 01/23/2020 21:55   [place] EKG SITE RITE  Result Date: 01/28/2020 If Site Rite image not attached, placement could not be confirmed due to current cardiac rhythm.  US Abdomen Limited RUQ  Result Date: 01/23/2020 CLINICAL DATA:  Elevated liver enzymes EXAM: ULTRASOUND ABDOMEN LIMITED RIGHT UPPER QUADRANT COMPARISON:  None. FINDINGS: Gallbladder: No gallstones or wall thickening visualized. No sonographic Murphy sign noted by sonographer. Common bile duct: Diameter: 5.9 mm Liver: Increased echogenicity seen throughout the liver parenchyma. No focal hepatic lesion. No biliary ductal dilatation. The portal vein is patent, with reversal of flow however and recanalized umbilical vein. Other: None. IMPRESSION: Hepatic steatosis Reversal of flow in the portal vein with findings suggestive of portal hypertension Electronically Signed   By: Prudencio Pair M.D.   On: 01/23/2020 21:21    Consults: Treatment Team:  Leim Fabry, MD   Subjective:    Overnight Issues: Extubated yesterday.  Ongoing issue with DTs and hallucinations  Objective:  Vital signs for last 24 hours: Temp:  [98.5 F (36.9 C)-99.7 F (37.6 C)] 99.6 F (37.6 C) (09/12 1400) Pulse Rate:  [33-108] 82 (09/12 1500) Resp:  [15-49] 44 (09/12 1500) BP: (88-152)/(52-116) 118/66 (09/12 1500) SpO2:  [85 %-99 %] 98 % (09/12 1500) FiO2 (%):  [30 %-35 %] 35 % (09/12 1704)  Hemodynamic parameters for last 24 hours:    Intake/Output from previous day: 09/11 0701 - 09/12 0700 In: 1689.2 [I.V.:1339.2; IV Piggyback:350] Out: 4166 [Urine:1350; Stool:200]  Intake/Output this shift: No intake/output data recorded.  Vent settings for last 24 hours: FiO2 (%):  [30 %-35 %] 35 %  Physical Exam:  GENERAL: Acutely ill-appearing male, no respiratory embarrassment, on high flow O2 mostly for  flow. HEAD: Normocephalic, atraumatic.  EYES: Pupils equal, round, reactive to light.  Scleral icterus noted, continues to be less pronounced.  MOUTH: Oral mucosa moist. NECK: Supple. No thyromegaly. Trachea midline. No JVD.  No adenopathy. PULMONARY: Good air entry bilaterally.  Coarse breath sounds otherwise no other adventitious sounds. CARDIOVASCULAR: S1 and S2. Regular rate and rhythm.  No rubs, murmurs or gallops heard. ABDOMEN: Obese, soft, nondistended.  Normoactive bowel sounds.  MUSCULOSKELETAL: No joint deformity, no clubbing, 1+ anasarca. NEUROLOGIC: Hallucinating, tremulous.  No overt focal deficit SKIN: Intact,warm,dry.  Multiple ecchymotic areas notably on left neck, bilateral hips, lower back, lower extremities. PSYCH: Delirious due to alcohol withdrawal  Assessment/Plan:   Acute respiratory failure with hypoxia Due to altered mental status/EtOH withdrawal Encephalopathy from liver failure/alcohol withdrawal Extubated yesterday On high flow mostly for flow High suspect for sleep apnea Intolerant of BiPAP Oxygen saturations well-maintained Protecting airway well Continue Precedex,and CIWA  Acute toxic metabolic encephalopathy EtOH withdrawal Hepatic dysfunction Supportive care Thiamine IV, high-dose Continue CIWA scale Ammonia trending down  Continue lactulose twice daily  Acute alcoholic hepatitis Hepatic encephalopathy Supportive care Continue nutrition Trend LFTs Trend ammonia and treat as needed Rifaximin 550 mg via OG twice daily Lactulose twice daily   Rhabdomyolysis Supportive care Improving CK normal (210) on 9/11 Check urine myoglobin and UA  Hematoma due to trauma Reabsorption of blood will also cause hepatic dysfunction Continue to monitor  Volume overload Lasix x1  Nutrition Status: Nutrition Problem: Inadequate oral intake Etiology: inability to eat Signs/Symptoms: NPO status Interventions: Tube feeding, Prostat, MVI      LOS: 11 days    I apprised patient's brother at bedside.  All questions and concerns were answered/addressed to his satisfaction.   Level 3 follow-up  C. Derrill Kay, MD [facility] 02/03/2020  *This note was dictated using voice recognition software/Dragon.  Despite best efforts to proofread, errors can occur which can change the meaning.  Any change was purely unintentional.

## 2020-02-03 NOTE — Progress Notes (Signed)
Patient Alert to self/date, but disoriented to place/situation. Patient is still on 2L  Patients right leg more swollen and hard compared to left. Provider aware and Xray of pelvis ordered. Patient received Ativan twice for  aneixty agitation and trying to climb out of bed.  Patient often refuses mouth care by biting down or swatting at swab.

## 2020-02-03 NOTE — Progress Notes (Signed)
Ch visited with Pt's brother Nash Dimmer as per request by Pt asking for a different version of Bible given previously. Ch took the Gideon's Bible back and brought an NRSV Study Bible. Nash Dimmer was glad to see the study Bible and was hopeful about reading it to Pt. He shared with Pt about Pt's poor prognosis, but hopes he could pull through. Nash Dimmer shared about recent significant losses, had stayed strong through them all, but his little brother's (Pt's) case feels like all the tied up grief are unleashing and crashing on him. Nash Dimmer requested prayer. Ch prayed with him. He says Pt's girlfriend will be here tomorrow. Ch let him know about anytime availability for chaplains for support.

## 2020-02-04 LAB — CBC WITH DIFFERENTIAL/PLATELET
Abs Immature Granulocytes: 0.12 10*3/uL — ABNORMAL HIGH (ref 0.00–0.07)
Basophils Absolute: 0.1 10*3/uL (ref 0.0–0.1)
Basophils Relative: 1 %
Eosinophils Absolute: 0 10*3/uL (ref 0.0–0.5)
Eosinophils Relative: 0 %
HCT: 29 % — ABNORMAL LOW (ref 39.0–52.0)
Hemoglobin: 9.5 g/dL — ABNORMAL LOW (ref 13.0–17.0)
Immature Granulocytes: 1 %
Lymphocytes Relative: 14 %
Lymphs Abs: 1.4 10*3/uL (ref 0.7–4.0)
MCH: 33.5 pg (ref 26.0–34.0)
MCHC: 32.8 g/dL (ref 30.0–36.0)
MCV: 102.1 fL — ABNORMAL HIGH (ref 80.0–100.0)
Monocytes Absolute: 1.1 10*3/uL — ABNORMAL HIGH (ref 0.1–1.0)
Monocytes Relative: 11 %
Neutro Abs: 7.6 10*3/uL (ref 1.7–7.7)
Neutrophils Relative %: 73 %
Platelets: 171 10*3/uL (ref 150–400)
RBC: 2.84 MIL/uL — ABNORMAL LOW (ref 4.22–5.81)
RDW: 27.1 % — ABNORMAL HIGH (ref 11.5–15.5)
Smear Review: NORMAL
WBC: 10 10*3/uL (ref 4.0–10.5)
nRBC: 0.5 % — ABNORMAL HIGH (ref 0.0–0.2)

## 2020-02-04 LAB — MAGNESIUM: Magnesium: 1.9 mg/dL (ref 1.7–2.4)

## 2020-02-04 LAB — GLUCOSE, CAPILLARY
Glucose-Capillary: 101 mg/dL — ABNORMAL HIGH (ref 70–99)
Glucose-Capillary: 104 mg/dL — ABNORMAL HIGH (ref 70–99)
Glucose-Capillary: 111 mg/dL — ABNORMAL HIGH (ref 70–99)
Glucose-Capillary: 112 mg/dL — ABNORMAL HIGH (ref 70–99)
Glucose-Capillary: 114 mg/dL — ABNORMAL HIGH (ref 70–99)
Glucose-Capillary: 116 mg/dL — ABNORMAL HIGH (ref 70–99)

## 2020-02-04 LAB — MYOGLOBIN, URINE: Myoglobin, Ur: 3 ng/mL (ref 0–13)

## 2020-02-04 LAB — HEMOGLOBIN AND HEMATOCRIT, BLOOD
HCT: 29.2 % — ABNORMAL LOW (ref 39.0–52.0)
Hemoglobin: 9.5 g/dL — ABNORMAL LOW (ref 13.0–17.0)

## 2020-02-04 LAB — PHOSPHORUS: Phosphorus: 3.2 mg/dL (ref 2.5–4.6)

## 2020-02-04 MED ORDER — LORAZEPAM 2 MG/ML IJ SOLN: 4.0000 mg | Freq: Once | INTRAMUSCULAR | Status: DC

## 2020-02-04 MED ORDER — LACTULOSE ENEMA
300.0000 mL | Freq: Every day | ORAL | Status: DC
  Filled 2020-02-04 (×2): qty 300

## 2020-02-04 NOTE — Progress Notes (Signed)
CRITICAL CARE NOTE [name] is an 49 y.o. male with a known history of polysubstance abuse including alcohol abuse who presented to the emergency room with acute onset of increased generalized weakness and amechanicalfall. Alcohol level 296 on admission.  Patient was transferred to ICU on 6 September due to increased work of breathing and accessory muscle use.  He was withdrawing from alcohol required intubation and mechanical ventilation.  Events: 9/6 transferred to ICU for increased WOB, using accessory muscles to breathe with liver failure 9/7 remains on vent, liver failure, PICC line placed, brother updated 9/8 remains encephalopathic, liver failure, resp failure 9/10 remains on ventilator.  Long discussion and update with brother.  Sedatives switch to Precedex and fentanyl, continue CIWA scale 9/11 still with some agitation when sedation is lightened.  Intolerance to SBT due to agitation. 9/12 extubated 9/13 high risk for intubation, + DT's, +resp failure   CC  follow up respiratory failure  SUBJECTIVE Patient remains critically ill Prognosis is guarded High risk for intubation   BP 128/82 (BP Location: Right Arm)   Pulse 93   Temp 99.4 F (37.4 C) (Oral)   Resp (!) 41   Ht 5' 7.01" (1.702 m)   Wt 120.2 kg   SpO2 95%   BMI 41.49 kg/m    I/O last 3 completed shifts: In: 453 [I.V.:353; IV Piggyback:100] Out: 950 [Urine:950] No intake/output data recorded.  SpO2: 95 % O2 Flow Rate (L/min): 55 L/min FiO2 (%): 35 %  Estimated body mass index is 41.49 kg/m as calculated from the following:   Height as of this encounter: 5' 7.01" (1.702 m).   Weight as of this encounter: 120.2 kg.  SIGNIFICANT EVENTS   REVIEW OF SYSTEMS  PATIENT IS UNABLE TO PROVIDE COMPLETE REVIEW OF SYSTEMS DUE TO SEVERE CRITICAL ILLNESS        PHYSICAL EXAMINATION:  GENERAL:critically ill appearing, +resp distress HEAD: Normocephalic, atraumatic.  EYES: Pupils equal, round,  reactive to light.  No scleral icterus.  MOUTH: Moist mucosal membrane. NECK: Supple.  PULMONARY: +rhonchi, +wheezing CARDIOVASCULAR: S1 and S2. Regular rate and rhythm. No murmurs, rubs, or gallops.  GASTROINTESTINAL: Soft, nontender, -distended.  Positive bowel sounds.   MUSCULOSKELETAL: No swelling, clubbing, or edema.  NEUROLOGIC: obtunded, GCS<8 SKIN:intact,warm,dry  MEDICATIONS: I have reviewed all medications and confirmed regimen as documented   CULTURE RESULTS   Recent Results (from the past 240 hour(s))  MRSA PCR Screening     Status: None   Collection Time: 01/28/20  2:19 PM   Specimen: Nasal Mucosa; Nasopharyngeal  Result Value Ref Range Status   MRSA by PCR NEGATIVE NEGATIVE Final    Comment:        The GeneXpert MRSA Assay (FDA approved for NASAL specimens only), is one component of a comprehensive MRSA colonization surveillance program. It is not intended to diagnose MRSA infection nor to guide or monitor treatment for MRSA infections. Performed at [facility], [address] [place], [place] [address]         BMP Latest Ref Rng & Units 02/03/2020 02/02/2020 02/01/2020  Glucose 70 - 99 mg/dL 782(N) 562(Z) 99  BUN 6 - 20 mg/dL 30(Q) 65(H) 84(O)  Creatinine 0.61 - 1.24 mg/dL 9.62 9.52 8.41  Sodium 135 - 145 mmol/L 145 144 145  Potassium 3.5 - 5.1 mmol/L 4.6 4.4 4.2  Chloride 98 - 111 mmol/L 112(H) 111 109  CO2 22 - 32 mmol/L 28 26 28   Calcium 8.9 - 10.3 mg/dL 8.1(L) 7.8(L) 7.6(L)  Indwelling Urinary Catheter continued, requirement due to   Reason to continue Indwelling Urinary Catheter strict Intake/Output monitoring for hemodynamic instability   Central Line/ continued, requirement due to  Reason to continue [employer] Monitoring of central venous pressure or other hemodynamic parameters and poor IV access      ASSESSMENT AND PLAN SYNOPSIS  Severe ACUTE Hypoxic and Hypercapnic Respiratory Failuredue to multiorgan failure with  liver failure, resp failure and aspiration pneumonia with severe DT's  Severe ACUTE Hypoxic and Hypercapnic Respiratory Failure High risk for re-intubation  ACUTE DIASTOLIC CARDIAC FAILURE- -oxygen as needed -Lasix as tolerated  Morbid obesity, possible OSA.   Will certainly impact respiratory mechanics    NEUROLOGY Acute toxic metabolic encephalopathy due to DT's   CARDIAC ICU monitoring  ID -continue IV abx as prescibed -follow up cultures  GI Liver failure GI PROPHYLAXIS as indicated Poor prognosis  NUTRITIONAL STATUS Nutrition Status: Nutrition Problem: Inadequate oral intake Etiology: inability to eat Signs/Symptoms: NPO status Interventions: Tube feeding, Prostat, MVI   DIET-->TF's as tolerated Constipation protocol as indicated  ENDO - will use ICU hypoglycemic\Hyperglycemia protocol if indicated     ELECTROLYTES -follow labs as needed -replace as needed -pharmacy consultation and following   DVT/GI PRX ordered and assessed TRANSFUSIONS AS NEEDED MONITOR FSBS I Assessed the need for Labs I Assessed the need for Foley I Assessed the need for Central Venous Line Family Discussion when available I Assessed the need for Mobilization I made an Assessment of medications to be adjusted accordingly Safety Risk assessment completed   CASE DISCUSSED IN MULTIDISCIPLINARY ROUNDS WITH ICU TEAM  Critical Care Time devoted to patient care services described in this note is 34 minutes.   Overall, patient is critically ill, prognosis is guarded.  Patient with Multiorgan failure and at high risk for cardiac arrest and death.    Lucie Leather, M.D.  [facility]  Medical Director [facility] Medical Director [facility] Cardio-Pulmonary Department

## 2020-02-04 NOTE — Plan of Care (Signed)
Pt turned Q2H this shift, VSS on HFNC.  He does not open his eyes, but does converse, mostly to request water.  He is disoriented to time and situation and rarely remembers where he is.  Unable to safely take POs so writing RN and patient's fiance (at bedside today) swabbing oral cavity regularly.  Consulted w/Dr Belia Heman re administering lactulose and I did give it rectally, but no BM so far today.  Pt with pronounced jaundice and yellowing of sclera

## 2020-02-04 NOTE — Progress Notes (Addendum)
Pt more alert late in shift, transitioned to 3 LPM [place], .4 precedex, answers most questions correctly, although not sure why he is in the hospital.  Taking ice chips w no issues.   His brother came to the hospital with a "POA" form for the patient to sign.  Pt's fiance went downstairs to get the form and bring it up.  I aksed if they thought the patient was alert enough to sign such documents.  Fiance said "They have talked about this.  It seems to be what Damichael wants."  Patient stated my brother and I have talked about this and I'm ok with signing it."  It appeared to be something related to their mother.  Fiance held the document and patient signed it.  Pt continues w/jaundice and tea colored urine. Per Dr Belia Heman do not hold heparin at this time d/t patient at high risk for clots.

## 2020-02-04 NOTE — Progress Notes (Signed)
Ch visited pt.'s rm. while rounding on ICU; pt.'s S.O. Farah at bedside; pt. lying down in bed asleep, wearing high-flow nasal canula --> extubated this weekend per S.O.  Jeannine Kitten shared pt. was talking earlier and she perceives some improvement in his condition; no needs at this time, but Jeannine Kitten is grateful for [facility] support and is aware of availability if needed.

## 2020-02-05 LAB — RENAL FUNCTION PANEL
Albumin: 2.7 g/dL — ABNORMAL LOW (ref 3.5–5.0)
Anion gap: 9 (ref 5–15)
BUN: 46 mg/dL — ABNORMAL HIGH (ref 6–20)
CO2: 26 mmol/L (ref 22–32)
Calcium: 8.5 mg/dL — ABNORMAL LOW (ref 8.9–10.3)
Chloride: 113 mmol/L — ABNORMAL HIGH (ref 98–111)
Creatinine, Ser: 0.91 mg/dL (ref 0.61–1.24)
GFR calc Af Amer: >60 mL/min (ref >60)
GFR calc non Af Amer: >60 mL/min (ref >60)
Glucose, Bld: 105 mg/dL — ABNORMAL HIGH (ref 70–99)
Phosphorus: 3.5 mg/dL (ref 2.5–4.6)
Potassium: 4.5 mmol/L (ref 3.5–5.1)
Sodium: 148 mmol/L — ABNORMAL HIGH (ref 135–145)

## 2020-02-05 LAB — CBC WITH DIFFERENTIAL/PLATELET
Abs Immature Granulocytes: 0.18 10*3/uL — ABNORMAL HIGH (ref 0.00–0.07)
Basophils Absolute: 0.1 10*3/uL (ref 0.0–0.1)
Basophils Relative: 1 %
Eosinophils Absolute: 0.1 10*3/uL (ref 0.0–0.5)
Eosinophils Relative: 1 %
HCT: 31.8 % — ABNORMAL LOW (ref 39.0–52.0)
Hemoglobin: 9.8 g/dL — ABNORMAL LOW (ref 13.0–17.0)
Immature Granulocytes: 2 %
Lymphocytes Relative: 14 %
Lymphs Abs: 1.7 10*3/uL (ref 0.7–4.0)
MCH: 33 pg (ref 26.0–34.0)
MCHC: 30.8 g/dL (ref 30.0–36.0)
MCV: 107.1 fL — ABNORMAL HIGH (ref 80.0–100.0)
Monocytes Absolute: 1.3 10*3/uL — ABNORMAL HIGH (ref 0.1–1.0)
Monocytes Relative: 11 %
Neutro Abs: 8.6 10*3/uL — ABNORMAL HIGH (ref 1.7–7.7)
Neutrophils Relative %: 71 %
Platelets: 167 10*3/uL (ref 150–400)
RBC: 2.97 MIL/uL — ABNORMAL LOW (ref 4.22–5.81)
RDW: 26.7 % — ABNORMAL HIGH (ref 11.5–15.5)
Smear Review: NORMAL
WBC: 11.8 10*3/uL — ABNORMAL HIGH (ref 4.0–10.5)
nRBC: 0.6 % — ABNORMAL HIGH (ref 0.0–0.2)

## 2020-02-05 LAB — MAGNESIUM: Magnesium: 2 mg/dL (ref 1.7–2.4)

## 2020-02-05 LAB — GLUCOSE, CAPILLARY
Glucose-Capillary: 101 mg/dL — ABNORMAL HIGH (ref 70–99)
Glucose-Capillary: 102 mg/dL — ABNORMAL HIGH (ref 70–99)
Glucose-Capillary: 103 mg/dL — ABNORMAL HIGH (ref 70–99)
Glucose-Capillary: 114 mg/dL — ABNORMAL HIGH (ref 70–99)
Glucose-Capillary: 89 mg/dL (ref 70–99)
Glucose-Capillary: 89 mg/dL (ref 70–99)

## 2020-02-05 LAB — PROCALCITONIN: Procalcitonin: 0.26 ng/mL

## 2020-02-05 MED ORDER — TRAZODONE HCL 50 MG PO TABS
25.0000 mg | ORAL_TABLET | Freq: Every evening | ORAL | Status: DC | PRN
  Administered 2020-02-05 – 2020-02-08 (×3): 25 mg via ORAL
  Filled 2020-02-05 (×3): qty 1

## 2020-02-05 MED ORDER — RIFAXIMIN 550 MG PO TABS
550.0000 mg | ORAL_TABLET | Freq: Two times a day (BID) | ORAL | Status: DC
  Administered 2020-02-05 – 2020-02-14 (×17): 550 mg via ORAL
  Filled 2020-02-05 (×19): qty 1

## 2020-02-05 MED ORDER — ENSURE ENLIVE PO LIQD: 237.0000 mL | Freq: Three times a day (TID) | ORAL | Status: DC

## 2020-02-05 MED ORDER — FOLIC ACID 1 MG PO TABS
1.0000 mg | ORAL_TABLET | Freq: Every day | ORAL | Status: DC
  Administered 2020-02-07 – 2020-02-14 (×8): 1 mg via ORAL
  Filled 2020-02-05 (×8): qty 1

## 2020-02-05 MED ORDER — ACETAMINOPHEN 325 MG PO TABS
650.0000 mg | ORAL_TABLET | Freq: Four times a day (QID) | ORAL | Status: DC | PRN
  Administered 2020-02-06 – 2020-02-08 (×2): 650 mg via ORAL
  Filled 2020-02-05 (×4): qty 2

## 2020-02-05 MED ORDER — DEXTROSE 5 % IV SOLN: INTRAVENOUS | Status: DC

## 2020-02-05 MED ORDER — ADULT MULTIVITAMIN W/MINERALS CH
1.0000 | ORAL_TABLET | Freq: Every day | ORAL | Status: DC
  Administered 2020-02-07 – 2020-02-14 (×8): 1 via ORAL
  Filled 2020-02-05 (×8): qty 1

## 2020-02-05 MED ORDER — ACETAMINOPHEN 650 MG RE SUPP: 650.0000 mg | Freq: Four times a day (QID) | RECTAL | Status: DC | PRN

## 2020-02-05 MED ORDER — LACTULOSE 10 GM/15ML PO SOLN: 30.0000 g | Freq: Two times a day (BID) | ORAL | Status: DC

## 2020-02-05 MED ORDER — ENSURE ENLIVE PO LIQD
237.0000 mL | Freq: Three times a day (TID) | ORAL | Status: DC
  Administered 2020-02-05 – 2020-02-14 (×20): 237 mL via ORAL

## 2020-02-05 MED ORDER — METOPROLOL TARTRATE 25 MG PO TABS
12.5000 mg | ORAL_TABLET | Freq: Two times a day (BID) | ORAL | Status: DC
  Administered 2020-02-05 – 2020-02-08 (×5): 12.5 mg via ORAL
  Filled 2020-02-05 (×5): qty 1

## 2020-02-05 NOTE — Plan of Care (Signed)

## 2020-02-05 NOTE — Progress Notes (Signed)
Pt with bilateral lower extremity edema along with calf pain, diffuse ecchymosis and petechiae, therefore will order venous ultrasound of bilateral lower extremities to rule out DVT.  Also, updated pts brother Bernal Luhman via telephone and all questions were answered.  Will continue to monitor and assess pt.  Sonda Rumble, AGNP  Pulmonary/Critical Care Pager [phone] (please enter 7 digits) PCCM Consult Pager [phone] (please enter 7 digits)

## 2020-02-05 NOTE — Evaluation (Signed)
Physical Therapy Evaluation Patient Details Name: [name] MRN: [id number] DOB: [birth year] Today's Date: 02/05/2020   History of Present Illness  49 y.o. male with a known history of polysubstance abuse including alcohol abuse who presented to the emergency room with acute onset of increased generalized weakness and a mechanical fall.  Alcohol level 296 on admission.  Patient was transferred to ICU on 6 September due to increased work of breathing and accessory muscle use.  He was withdrawing from alcohol required intubation and mechanical ventilation. Extubated 9/12, Precedex drip stopped 9/14.  Clinical Impression  Pt tired from working with OT, but eager to try and see how he does getting up.  We did multiple standing attempts but he simply could not get LEs underneath him and shift weight onto the walker.  He showed great effort t/o the session but was very limited due to R LE pain and swelling, with strength and ROM testing he had very little AROM on the R and due to pain tolerated little PROM, lacked ankle ROM a few degrees from 0 and this likely contributed to his lack of ability to attain standing.    Follow Up Recommendations CIR    Equipment Recommendations  Rolling walker with 5" wheels;3in1 (PT) (per progress/next venue of care)    Recommendations for Other Services       Precautions / Restrictions Precautions Precautions: Fall Restrictions Weight Bearing Restrictions: No      Mobility  Bed Mobility Overal bed mobility: Needs Assistance Bed Mobility: Supine to Sit;Sit to Supine Rolling: Min assist   Supine to sit: Mod assist Sit to supine: Mod assist   General bed mobility comments: Pt needed assist with getting R LE off/on bed, heavy assist with trunk both up to and down from sitting  Transfers Overall transfer level: Needs assistance Equipment used: Rolling walker (2 wheeled) Transfers: Sit to/from Stand Sit to Stand: Mod assist;Max assist          General transfer comment: 2 standing attempts with pt showing great effort each time.  Simply put he was too weak, had too much pain to rise, unable to get R hips forward/ankle to neutral.  Minimal weight acceptance on R.  Ambulation/Gait             General Gait Details: uanble/unsafe to attempt  Stairs            Wheelchair Mobility    Modified Rankin (Stroke Patients Only)       Balance Overall balance assessment: Needs assistance Sitting-balance support: Bilateral upper extremity supported Sitting balance-Leahy Scale: Good       Standing balance-Leahy Scale: Zero Standing balance comment: unable to get weight forward enough to even utilize walker effectively, leaning back with heavy PT assist                             Pertinent Vitals/Pain Pain Assessment: 0-10 Pain Score: 8  Pain Location: R LE > L LE pain, swollen/bruised Pain Descriptors / Indicators: Shooting;Sharp;Pins and needles;Tingling Pain Intervention(s): Limited activity within patient's tolerance;Monitored during session;Repositioned    Home Living Family/patient expects to be discharged to:: Skilled nursing facility Living Arrangements: Non-relatives/Friends Available Help at Discharge: Friend(s);Family;Available 24 hours/day Type of Home: House Home Access: Ramped entrance     Home Layout: One level Home Equipment: Cane - single point Additional Comments: roommate with health issues, pt borrowed roomate's w/c briefly right before admission    Prior Function  Level of Independence: Independent         Comments: Prior to [facility] before this admission, pt was independent, 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 Pt reports that he began to use a SPC week prior to admission and then borrowed roomate's w/c 3 days prior to admission 2/2 not feeling well before he fell (for 2nd time) and roommate  called 911     Hand Dominance   Dominant Hand: Right    Extremity/Trunk Assessment   Upper Extremity Assessment Upper Extremity Assessment: Overall WFL for tasks assessed;Generalized weakness    Lower Extremity Assessment Lower Extremity Assessment: Generalized weakness (R LE with almost no AROM, lacks DF to neutral, L 3+/5) RLE Deficits / Details: grossly 2+, impaired sensation in ankle/foot, limited AROM/AAROM of DF with pt reporting calf tightness with attempts to get to neutral/past neutral, significant bruising noted, edema noted RLE: Unable to fully assess due to pain RLE Sensation: decreased light touch RLE Coordination: decreased fine motor;decreased gross motor LLE Deficits / Details: grossly 3-, impaired sensation in ankle/foot (not as bad as R), limited AROM/AAROM of DF with pt reporting calf tightness with attempts to get to neutral/past neutral, significant bruising noted, edema noted LLE: Unable to fully assess due to pain LLE Sensation: decreased light touch LLE Coordination: decreased gross motor;decreased fine motor       Communication   Communication: Other (comment) (quite, winded, labored speech)  Cognition Arousal/Alertness: Awake/alert Behavior During Therapy: WFL for tasks assessed/performed Overall Cognitive Status: Impaired/Different from baseline Area of Impairment: Orientation                 Orientation Level: Disoriented to;Situation             General Comments: Pt alert, oriented (slightly confused with situation/reason for admission), follows commands well, eager to participate despite fatigue, reports eagerness to stop substance use/abuse and "turn my life around before it's too late"      General Comments General comments (skin integrity, edema, etc.): significant bruising on BLE, edema bilat, VSS during session    Exercises Other Exercises Other Exercises: bedmobility training with VC for hand placement to improve technique and  stability in sidelying for pericare/bed level toileting task Other Exercises: Pt instructed in PLB for breath recovery during exertional activity, pt demo's technique with cues to initiate Other Exercises: Pt instructed briefly in BUE and BLE ther ex to perform at bed level to maintain and improve functional strength for future ADL mobility and ADL tasks   Assessment/Plan    PT Assessment Patient needs continued PT services  PT Problem List Decreased strength;Decreased range of motion;Decreased activity tolerance;Decreased balance;Decreased mobility;Decreased coordination;Decreased knowledge of use of DME;Decreased safety awareness;Pain       PT Treatment Interventions DME instruction;Gait training;Functional mobility training;Therapeutic activities;Therapeutic exercise;Balance training;Neuromuscular re-education;Patient/family education    PT Goals (Current goals can be found in the Care Plan section)  Acute Rehab PT Goals Patient Stated Goal: to get stronger and be independent again PT Goal Formulation: With patient Time For Goal Achievement: 02/19/20 Potential to Achieve Goals: Good    Frequency Min 2X/week   Barriers to discharge        Co-evaluation               AM-PAC PT "6 Clicks" Mobility  Outcome Measure Help needed turning from your back to your side while in a flat bed without using bedrails?: A Lot Help needed moving from  lying on your back to sitting on the side of a flat bed without using bedrails?: A Lot Help needed moving to and from a bed to a chair (including a wheelchair)?: Total Help needed standing up from a chair using your arms (e.g., wheelchair or bedside chair)?: Total Help needed to walk in hospital room?: Total Help needed climbing 3-5 steps with a railing? : Total 6 Click Score: 8    End of Session Equipment Utilized During Treatment: Gait belt;Oxygen (3L) Activity Tolerance: Patient limited by pain;Patient limited by fatigue Patient left:  in bed;with call bell/phone within reach   PT Visit Diagnosis: Muscle weakness (generalized) (M62.81);Difficulty in walking, not elsewhere classified (R26.2);Pain;Unsteadiness on feet (R26.81) Pain - Right/Left: Right Pain - part of body: Leg    Time: 1500-1533 PT Time Calculation (min) (ACUTE ONLY): 33 min   Charges:   PT Evaluation $PT Eval Moderate Complexity: 1 Mod PT Treatments $Therapeutic Activity: 8-22 mins        Malachi Pro, DPT 02/05/2020, 4:26 PM

## 2020-02-05 NOTE — Evaluation (Signed)
Occupational Therapy Evaluation Patient Details Name: [name] MRN: [id number] DOB: 1971/03/15 Today's Date: 02/05/2020    History of Present Illness 49 y.o. male with a known history of polysubstance abuse including alcohol abuse who presented to the emergency room with acute onset of increased generalized weakness and a mechanical fall.  Alcohol level 296 on admission.  Patient was transferred to ICU on 6 September due to increased work of breathing and accessory muscle use.  He was withdrawing from alcohol required intubation and mechanical ventilation. Extubated 9/12, Precedex drip stopped 9/14.   Clinical Impression   Pt was seen for OT evaluation this date. Cleared for participation with therapy per RN. Pt alert and pleasant, agreeable to therapy. 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 Pt reports 1 week prior to admission needing to rely on [facility] for mobility, transitioning to borrowing roommate's w/c 3 days prior to admission, until the point he fell twice and his roommate called EMS for help, leading to this admission. Pt lives with this roommate in a 1 story ramped entrance home with access to a walk-in shower. Pt endorsed need for assist with toileting after BM in bed at start of session. Pt able to perform bed mobility with Min A and VC for hand placement to support maintaining sidelying position for pericare/hygiene after loose stool in bed. Pt tolerated bed mobility well, VSS throughout. Pt alert and oriented (mild confusion noted with reason for admission) and follows commands well. Eager to improve and return to independence while also focusing on sobriety, stating he wanted to "turn my life around before it's too late." Currently pt demonstrates impairments as described below (See OT problem  list) which functionally limit his ability to perform ADL/self-care tasks. Pt currently requires Min A for rolling, bed level self feeding and grooming, and anticipate Min-Mod A for UB ADL, Max A for LB ADL given impairments. Pt currently requires significantly more assist than previous independence and very eager to return to PLOF. Pt would benefit from skilled OT to address noted impairments and functional limitations (see below for any additional details) in order to maximize safety and independence while minimizing falls risk and caregiver burden. Upon hospital discharge, recommend high-intensity CIR level rehab to maximize pt safety and return to PLOF. Anticipate excellent progress towards goals.     Follow Up Recommendations  CIR    Equipment Recommendations  None recommended by OT (TBD)    Recommendations for Other Services Rehab consult     Precautions / Restrictions Precautions Precautions: Fall Restrictions Weight Bearing Restrictions: No      Mobility Bed Mobility Overal bed mobility: Needs Assistance Bed Mobility: Rolling Rolling: Min assist         General bed mobility comments: pt able to perform rolling side to side for bed level toileting and pericare with OT/RN requiring minimal assist to perform and VC for hand placement on bed rails to support sidelying; further mobility deferred after 2nd BM near end of session  Transfers                 General transfer comment: will continue to assess next session    Balance  ADL either performed or assessed with clinical judgement   ADL Overall ADL's : Needs assistance/impaired                                       General ADL Comments: Pt able to self feed and perform light grooming tasks at bed level after set up of tray. Requires Min A-Mod A for UB dressing, anticipate Max A for LB ADL 2/2 impairments; ADL mobility OOB deferred 2/2  need for additional assist for 2nd BM at end of session     Vision Baseline Vision/History: Wears glasses Wears Glasses: At all times (pt reports he does not have glasses here but brother will look for them) Patient Visual Report: No change from baseline       Perception     Praxis      Pertinent Vitals/Pain Pain Assessment: 0-10 Pain Score: 8  Pain Location: 8/10 RLE pain from hip shooting/sharp into LE, 5/10 LLE with same presentation; pain increases with movement; bilat feet (R>) with tingling/pins and needles sensory changes Pain Descriptors / Indicators: Shooting;Sharp;Pins and needles;Tingling Pain Intervention(s): Limited activity within patient's tolerance;Monitored during session;Repositioned     Hand Dominance Right   Extremity/Trunk Assessment Upper Extremity Assessment Upper Extremity Assessment: Overall WFL for tasks assessed (strength very good but difficulty with sustained strength/endurance; mild tremors in hands with attempts at shoulder flexion)   Lower Extremity Assessment Lower Extremity Assessment: Generalized weakness;LLE deficits/detail;RLE deficits/detail;Defer to PT evaluation RLE Deficits / Details: grossly 2+, impaired sensation in ankle/foot, limited AROM/AAROM of DF with pt reporting calf tightness with attempts to get to neutral/past neutral, significant bruising noted, edema noted RLE: Unable to fully assess due to pain RLE Sensation: decreased light touch RLE Coordination: decreased fine motor;decreased gross motor LLE Deficits / Details: grossly 3-, impaired sensation in ankle/foot (not as bad as R), limited AROM/AAROM of DF with pt reporting calf tightness with attempts to get to neutral/past neutral, significant bruising noted, edema noted LLE: Unable to fully assess due to pain LLE Sensation: decreased light touch LLE Coordination: decreased gross motor;decreased fine motor       Communication Communication Communication: Other (comment)  (soft/winded like voice)   Cognition Arousal/Alertness: Awake/alert Behavior During Therapy: WFL for tasks assessed/performed Overall Cognitive Status: Impaired/Different from baseline Area of Impairment: Orientation                 Orientation Level: Disoriented to;Situation             General Comments: Pt alert, oriented (slightly confused with situation/reason for admission), follows commands well, eager to participate despite fatigue, reports eagerness to stop substance use/abuse and "turn my life around before it's too late"   General Comments  significant bruising on BLE, edema bilat, VSS during session    Exercises Other Exercises Other Exercises: bedmobility training with VC for hand placement to improve technique and stability in sidelying for pericare/bed level toileting task Other Exercises: Pt instructed in PLB for breath recovery during exertional activity, pt demo's technique with cues to initiate Other Exercises: Pt instructed briefly in BUE and BLE ther ex to perform at bed level to maintain and improve functional strength for future ADL mobility and ADL tasks   Shoulder Instructions      Home Living Family/patient expects to be discharged to:: Private residence Living Arrangements: Non-relatives/Friends Available Help at Discharge: Friend(s);Family;Available 24 hours/day (reports he could have 24/7 coverage  between family/friends, if needed) Type of Home: House Home Access: Ramped entrance     Home Layout: One level     Bathroom Shower/Tub: Walk-in shower;Tub/shower unit   Bathroom Toilet: Standard     Home Equipment: [employer] - single point   Additional Comments: roommate with health issues, pt borrowed roomate's w/c briefly right before admission      Prior Functioning/Environment Level of Independence: Independent        Comments: Prior to [facility] before this admission, pt was independent, 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 Pt reports that he began to use a SPC week prior to admission and then borrowed roomate's w/c 3 days prior to admission 2/2 not feeling well before he fell (for 2nd time) and roommate called 911        OT Problem List: Decreased strength;Decreased coordination;Pain;Decreased range of motion;Impaired sensation;Increased edema;Decreased activity tolerance;Impaired balance (sitting and/or standing);Decreased knowledge of use of DME or AE      OT Treatment/Interventions: Self-care/ADL training;Therapeutic exercise;Therapeutic activities;DME and/or AE instruction;Patient/family education;Balance training    OT Goals(Current goals can be found in the care plan section) Acute Rehab OT Goals Patient Stated Goal: to get stronger and be independent again OT Goal Formulation: With patient Time For Goal Achievement: 02/19/20 Potential to Achieve Goals: Good ADL Goals Pt Will Perform Upper Body Dressing: sitting;with set-up;with min guard assist Pt Will Perform Lower Body Dressing: sit to/from stand;with mod assist Pt Will Transfer to Toilet: bedside commode;ambulating;with min assist (LRAD for amb) Additional ADL Goal #1: Pt will tolerate EOB sitting ADL tasks with tray set up, no LOB, >10 minutes, VSS throughout.  OT Frequency: Min 3X/week   Barriers to D/C:            Co-evaluation              AM-PAC OT "6 Clicks" Daily Activity     Outcome Measure Help from another person eating meals?: None Help from another person taking care of personal grooming?: A Little Help from another person toileting, which includes using toliet, bedpan, or urinal?: A Lot Help from another person bathing (including washing, rinsing, drying)?: A Lot Help from another person to put on and taking off regular upper body clothing?: A Little Help from another person to put on and taking off regular lower body clothing?: A Lot 6 Click  Score: 16   End of Session Nurse Communication: Other (comment) (assist for 2nd BM)  Activity Tolerance: Patient tolerated treatment well Patient left: in bed;with call bell/phone within reach;with bed alarm set;with nursing/sitter in room  OT Visit Diagnosis: Other abnormalities of gait and mobility (R26.89);History of falling (Z91.81);Muscle weakness (generalized) (M62.81);Pain Pain - Right/Left: Right (both, R>L) Pain - part of body: Hip;Leg                Time: 4970-2637 OT Time Calculation (min): 33 min Charges:  OT General Charges $OT Visit: 1 Visit OT Evaluation $OT Eval High Complexity: 1 High OT Treatments $Self Care/Home Management : 8-22 mins  Richrd Prime, MPH, MS, OTR/L ascom [phone] 02/05/20, 2:45 PM

## 2020-02-05 NOTE — Progress Notes (Signed)
CRITICAL CARE NOTE [name] is an 49 y.o. male with a known history of polysubstance abuse including alcohol abuse who presented to the emergency room with acute onset of increased generalized weakness and amechanicalfall. Alcohol level 296 on admission.  Patient was transferred to ICU on 6 September due to increased work of breathing and accessory muscle use.  He was withdrawing from alcohol required intubation and mechanical ventilation.  Events: 9/6 transferred to ICU for increased WOB, using accessory muscles to breathe with liver failure 9/7 remains on vent, liver failure, PICC line placed, brother updated 9/8 remains encephalopathic, liver failure, resp failure 9/10 remains on ventilator.  Long discussion and update with brother.  Sedatives switch to Precedex and fentanyl, continue CIWA scale 9/11 still with some agitation when sedation is lightened.  Intolerance to SBT due to agitation. 9/12 extubated 9/13 hypoxia improving 9/14 more alert awake, DT's improving   CC follow up DT's  SUBJECTIVE Oxygen requirement as decreased More alert and awake Weaning off precedex  BP 125/79   Pulse 66   Temp 98.9 F (37.2 C) (Oral)   Resp (!) 34   Ht 5' 7.01" (1.702 m)   Wt 120.2 kg   SpO2 98%   BMI 41.49 kg/m    I/O last 3 completed shifts: In: 1185.2 [I.V.:1135.2; IV Piggyback:50] Out: 650 [Urine:650] Total I/O In: 289.8 [I.V.:239.3; IV Piggyback:50.5] Out: 411 [Urine:410; Stool:1]  SpO2: 98 % O2 Flow Rate (L/min): 3 L/min FiO2 (%): 35 %  Estimated body mass index is 41.49 kg/m as calculated from the following:   Height as of this encounter: 5' 7.01" (1.702 m).   Weight as of this encounter: 120.2 kg.   Review of Systems:  Gen:  Denies  fever, sweats, chills weight loss  Other:  All other systems negative  Physical Examination:   General Appearance: No distress  Neuro:without focal findings,  speech normal,  HEENT: PERRLA, EOM intact.   Pulmonary:  normal breath sounds, No wheezing.  CardiovascularNormal S1,S2.  No m/r/g.   Abdomen: Benign, Soft, non-tender. No focal deficits   ALL OTHER ROS ARE NEGATIVE   MEDICATIONS: I have reviewed all medications and confirmed regimen as documented   CULTURE RESULTS   Recent Results (from the past 240 hour(s))  MRSA PCR Screening     Status: None   Collection Time: 01/28/20  2:19 PM   Specimen: Nasal Mucosa; Nasopharyngeal  Result Value Ref Range Status   MRSA by PCR NEGATIVE NEGATIVE Final    Comment:        The GeneXpert MRSA Assay (FDA approved for NASAL specimens only), is one component of a comprehensive MRSA colonization surveillance program. It is not intended to diagnose MRSA infection nor to guide or monitor treatment for MRSA infections. Performed at [facility], [address] [place], [place] [address]         BMP Latest Ref Rng & Units 02/05/2020 02/03/2020 02/02/2020  Glucose 70 - 99 mg/dL 517(O) 160(V) 371(G)  BUN 6 - 20 mg/dL 62(I) 94(W) 54(O)  Creatinine 0.61 - 1.24 mg/dL 2.70 3.50 0.93  Sodium 135 - 145 mmol/L 148(H) 145 144  Potassium 3.5 - 5.1 mmol/L 4.5 4.6 4.4  Chloride 98 - 111 mmol/L 113(H) 112(H) 111  CO2 22 - 32 mmol/L 26 28 26   Calcium 8.9 - 10.3 mg/dL ) 8.1(L) 7.8(L)     Indwelling Urinary Catheter continued, requirement due to   Reason to continue Indwelling Urinary Catheter strict Intake/Output monitoring for hemodynamic instability  Central Line/ continued, requirement due to  Reason to continue [employer] of central venous pressure or other hemodynamic parameters and poor IV access      ASSESSMENT AND PLAN SYNOPSIS  Severe ACUTE Hypoxic and Hypercapnic Respiratory Failuredue to multiorgan failure with liver failure, resp failure and aspiration pneumonia with severe DT's-MUCH improved  Severe ACUTE Hypoxic and Hypercapnic Respiratory Failure RESOLVED   ACUTE DIASTOLIC CARDIAC FAILURE- RESOLVING    NEUROLOGY DT's improving  ID -continue IV abx as prescibed -follow up cultures  GI Liver failure Follow up GI recs  DIET--> as tolerated Constipation protocol as indicated  ENDO - will use ICU hypoglycemic\Hyperglycemia protocol if indicated     ELECTROLYTES -follow labs as needed -replace as needed -pharmacy consultation and following  TRANSFER TO TRH  Wallis Bamberg Santiago Glad, M.D.  [facility]  Medical Director [facility] Medical Director [facility] Cardio-Pulmonary Department

## 2020-02-05 NOTE — Progress Notes (Signed)
Ch visited with Pt and Pt's brother Nash Dimmer after being paged by RN to help with AD completion. Ch gave AD education and asked them to request for a Ch when they are ready to complete and get the document notarized. Nash Dimmer also requested for help with witnesses when Pt signs his financial POA with the help on an outside notary over video. Ch awaiting page from room to complete AD.

## 2020-02-05 NOTE — Progress Notes (Addendum)
Nutrition Follow-up  DOCUMENTATION CODES:   Obesity unspecified  INTERVENTION:  Provide Ensure Enlive po TID, each supplement provides 350 kcal and 20 grams of protein. Patient prefers vanilla.  NUTRITION DIAGNOSIS:   Inadequate oral intake related to decreased appetite as evidenced by per patient/family report.  New nutrition diagnosis.  GOAL:   Patient will meet greater than or equal to 90% of their needs  Progressing with diet advancement.  MONITOR:   PO intake, Supplement acceptance, Diet advancement, Labs, Weight trends, I & O's  REASON FOR ASSESSMENT:   Ventilator, Consult Enteral/tube feeding initiation and management  ASSESSMENT:   49 year old male with PMHx of polysubstance abuse, EtOH abuse admitted with acute onset of generalized weakness and mechanical fall with subsequent right hip pain and large right hip hematoma, also with liver failure, hepatic encephalopathy, acute aspiration PNA, possible OSA.  9/12 extubated  Discussed with RN and on rounds. Patient more alert today. Precedex gtt being weaned and diet is able to be advanced to full liquid diet.  Met with patient and his brother at bedside. Patient reports his appetite feels decreased. He only wanted some ice chips and water at time of RD assessment. Patient is amenable to drinking Ensure to help meet calorie/protein needs. 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   Medications reviewed and include: folic acid 1 mg daily, lactulose 30 grams BID, MVI daily, Protonix, Precedex gtt now stopped, D5W at 50 mL/hr, thiamine 500 mg daily IV now stopped.  Labs reviewed: CBG 89-102, Sodium 148, Chloride 113, BUN 46.  I/O: 650 mL UOP yesterday (0.2 mL/kg/hr)  Weight trend: 120.2 kg on 9/11; +7 kg from 9/8 likely from fluid status as pt now with moderate pitting edema per RN documentation  Diet Order:   Diet Order            Diet full liquid Room service appropriate?  Yes; Fluid consistency: Thin  Diet effective now                EDUCATION NEEDS:   No education needs have been identified at this time  Skin:  Skin Assessment: Reviewed RN Assessment  Last BM:  02/04/2020  Height:   Ht Readings from Last 1 Encounters:  02/02/20 5' 7.01" (1.702 m)   Weight:   Wt Readings from Last 1 Encounters:  02/02/20 120.2 kg   Ideal Body Weight:  67.3 kg  BMI:  Body mass index is 41.49 kg/m.  Estimated Nutritional Needs:   Kcal:  2300-2500  Protein:  115-125 grams  Fluid:  >/=2.3 L/day  Jacklynn Barnacle, MS, RD, LDN Pager number available on Amion

## 2020-02-05 NOTE — Progress Notes (Addendum)
Shift summary:  - Diet advanced to FLD. - PT, OT ordered. - P+V per Progressa bed, IS and FV. - Weaning Precedex as tolerated; off as of this AM. - Transfer orders to PCU.

## 2020-02-06 ENCOUNTER — Inpatient Hospital Stay: Payer: Self-pay

## 2020-02-06 LAB — CBC WITH DIFFERENTIAL/PLATELET
Abs Immature Granulocytes: 0.35 10*3/uL — ABNORMAL HIGH (ref 0.00–0.07)
Basophils Absolute: 0.1 10*3/uL (ref 0.0–0.1)
Basophils Relative: 1 %
Eosinophils Absolute: 0.1 10*3/uL (ref 0.0–0.5)
Eosinophils Relative: 1 %
HCT: 30.2 % — ABNORMAL LOW (ref 39.0–52.0)
Hemoglobin: 9.5 g/dL — ABNORMAL LOW (ref 13.0–17.0)
Immature Granulocytes: 2 %
Lymphocytes Relative: 14 %
Lymphs Abs: 2.2 10*3/uL (ref 0.7–4.0)
MCH: 33.9 pg (ref 26.0–34.0)
MCHC: 31.5 g/dL (ref 30.0–36.0)
MCV: 107.9 fL — ABNORMAL HIGH (ref 80.0–100.0)
Monocytes Absolute: 1.4 10*3/uL — ABNORMAL HIGH (ref 0.1–1.0)
Monocytes Relative: 10 %
Neutro Abs: 10.9 10*3/uL — ABNORMAL HIGH (ref 1.7–7.7)
Neutrophils Relative %: 72 %
Platelets: 191 10*3/uL (ref 150–400)
RBC: 2.8 MIL/uL — ABNORMAL LOW (ref 4.22–5.81)
RDW: 27 % — ABNORMAL HIGH (ref 11.5–15.5)
Smear Review: NORMAL
WBC: 15 10*3/uL — ABNORMAL HIGH (ref 4.0–10.5)
nRBC: 0.7 % — ABNORMAL HIGH (ref 0.0–0.2)

## 2020-02-06 LAB — RENAL FUNCTION PANEL
Albumin: 2.7 g/dL — ABNORMAL LOW (ref 3.5–5.0)
Anion gap: 10 (ref 5–15)
BUN: 31 mg/dL — ABNORMAL HIGH (ref 6–20)
CO2: 25 mmol/L (ref 22–32)
Calcium: 8.9 mg/dL (ref 8.9–10.3)
Chloride: 110 mmol/L (ref 98–111)
Creatinine, Ser: 0.86 mg/dL (ref 0.61–1.24)
GFR calc Af Amer: >60 mL/min (ref >60)
GFR calc non Af Amer: >60 mL/min (ref >60)
Glucose, Bld: 108 mg/dL — ABNORMAL HIGH (ref 70–99)
Phosphorus: 3.5 mg/dL (ref 2.5–4.6)
Potassium: 3.7 mmol/L (ref 3.5–5.1)
Sodium: 145 mmol/L (ref 135–145)

## 2020-02-06 LAB — GLUCOSE, CAPILLARY
Glucose-Capillary: 102 mg/dL — ABNORMAL HIGH (ref 70–99)
Glucose-Capillary: 100 mg/dL — ABNORMAL HIGH (ref 70–99)
Glucose-Capillary: 103 mg/dL — ABNORMAL HIGH (ref 70–99)
Glucose-Capillary: 101 mg/dL — ABNORMAL HIGH (ref 70–99)

## 2020-02-06 LAB — HEPATIC FUNCTION PANEL
ALT: 69 U/L — ABNORMAL HIGH (ref 0–44)
AST: 145 U/L — ABNORMAL HIGH (ref 15–41)
Albumin: 2.7 g/dL — ABNORMAL LOW (ref 3.5–5.0)
Alkaline Phosphatase: 53 U/L (ref 38–126)
Bilirubin, Direct: 2.7 mg/dL — ABNORMAL HIGH (ref 0.0–0.2)
Indirect Bilirubin: 5.8 mg/dL — ABNORMAL HIGH (ref 0.3–0.9)
Total Bilirubin: 8.5 mg/dL — ABNORMAL HIGH (ref 0.3–1.2)
Total Protein: 6.6 g/dL (ref 6.5–8.1)

## 2020-02-06 LAB — PROCALCITONIN: Procalcitonin: 0.21 ng/mL

## 2020-02-06 LAB — PHOSPHORUS: Phosphorus: 3.5 mg/dL (ref 2.5–4.6)

## 2020-02-06 LAB — MAGNESIUM: Magnesium: 1.8 mg/dL (ref 1.7–2.4)

## 2020-02-06 MED ORDER — THIAMINE HCL 100 MG PO TABS
100.0000 mg | ORAL_TABLET | Freq: Every day | ORAL | Status: DC
  Administered 2020-02-07 – 2020-02-14 (×8): 100 mg via ORAL
  Filled 2020-02-06 (×8): qty 1

## 2020-02-06 MED ORDER — LACTULOSE 10 GM/15ML PO SOLN: 30.0000 g | Freq: Three times a day (TID) | ORAL | Status: DC

## 2020-02-06 MED ORDER — FUROSEMIDE 20 MG PO TABS
20.0000 mg | ORAL_TABLET | Freq: Every day | ORAL | Status: DC
  Administered 2020-02-07: 20 mg via ORAL
  Filled 2020-02-06: qty 1

## 2020-02-06 MED ORDER — LACTULOSE 10 GM/15ML PO SOLN
30.0000 g | Freq: Two times a day (BID) | ORAL | Status: DC
  Administered 2020-02-07 – 2020-02-13 (×11): 30 g via ORAL
  Filled 2020-02-06 (×14): qty 60

## 2020-02-06 MED ORDER — TRAMADOL HCL 50 MG PO TABS
50.0000 mg | ORAL_TABLET | Freq: Four times a day (QID) | ORAL | Status: DC
  Administered 2020-02-07 – 2020-02-12 (×21): 50 mg via ORAL
  Filled 2020-02-06 (×21): qty 1

## 2020-02-06 NOTE — Progress Notes (Signed)
Physical Therapy Treatment Patient Details Name: [name] MRN: [id number] DOB: 1970-12-14 Today's Date: 02/06/2020    History of Present Illness 49 y.o. male with a known history of polysubstance abuse including alcohol abuse who presented to the emergency room with acute onset of increased generalized weakness and a mechanical fall.  Alcohol level 296 on admission.  Patient was transferred to ICU on 6 September due to increased work of breathing and accessory muscle use.  He was withdrawing from alcohol required intubation and mechanical ventilation. Extubated 9/12, Precedex drip stopped 9/14.    PT Comments    Patient presents in bed in fowler's position. He demonstrated good motivation during session, but limited due to pain, fatigue, and dizziness. Pt is in 8/10 pain and notes the room is spinning. Patient is mod +2 for supine <> sit with getting R LE off/on bed and assisting trunk up/down from sitting EOB. At EOB, pt able to sit with no UE support, but only for a short time before he leans posteriorly and need RUE support. When sitting, pt is very unsteady and notes dizziness, so did not attempt to stand. Pt is min to mod +2 for bed rolling, but is able to bring UE to bedrail to help support himself. VCs needed for hand placement on bed rails and bending the appropriate knee. Once situated in bed, pt is fatigued and refused to continue any more strengthening exercises. Monitored O2 sat levels throughout session and he stayed at 94 with 4 L. Pt would benefit from skilled PT to address impairments and functional limitations in order to maximize function. Pt cannot tolerate intense therapy, therefore changing recommendations to SNF to maximize pt safety and return to PLOF.    Follow Up Recommendations  SNF     Equipment Recommendations  Rolling walker with 5" wheels;3in1 (PT) (per progress/next venue of care)    Recommendations for Other Services       Precautions / Restrictions  Precautions Precautions: Fall Restrictions Weight Bearing Restrictions: No    Mobility  Bed Mobility Overal bed mobility: Needs Assistance Bed Mobility: Rolling;Supine to Sit;Sit to Supine Rolling: Min assist   Supine to sit: Mod assist Sit to supine: Mod assist   General bed mobility comments: Patient able to perform rolling side to side for bed level toileting and pericare with OT/PT/RN requiring min A to perform, VC utilized for hand placement on bed rails to support in sideyling; Pt needed assist with getting R LE off/on bed, heavy assist with trunk both up to and down from sitting  Transfers                 General transfer comment: Did not attempt due to pt's dizziness/unsteadiness  Ambulation/Gait             General Gait Details: Unable/unsafe to attempt   Stairs             Wheelchair Mobility    Modified Rankin (Stroke Patients Only)       Balance Overall balance assessment: Needs assistance Sitting-balance support: Single extremity supported;Bilateral upper extremity supported;Feet supported Sitting balance-Leahy Scale: Fair Sitting balance - Comments: Patient able to maintain balance for a short period without any UE support, but will then lean back and place his R UE on the rail at foot of the bed to support himself Postural control: Posterior lean     Standing balance comment: Unable to stand to assess due to patient's dizziness/unsteadiness  Cognition Arousal/Alertness: Awake/alert Behavior During Therapy: WFL for tasks assessed/performed;Flat affect Overall Cognitive Status: Within Functional Limits for tasks assessed                                 General Comments: Pt is alert and following commands, difficulty understanding what he's saying because of volume of voice and hard for him to pronounce words      Exercises Other Exercises Other Exercises: bedmobility training with  VC for hand placement to improve technique and stability in sidelying for pericare/bed level toileting task Other Exercises: Grip strength x 10 secs BUE    General Comments General comments (skin integrity, edema, etc.): Redness/bruising on BLE      Pertinent Vitals/Pain Pain Assessment: 0-10 Pain Score: 8  Pain Location: R LE Pain Descriptors / Indicators: Shooting;Sharp;Pins and needles;Tingling Pain Intervention(s): Monitored during session    Home Living                      Prior Function            PT Goals (current goals can now be found in the care plan section) Acute Rehab PT Goals Patient Stated Goal: to get stronger and be independent again PT Goal Formulation: With patient Time For Goal Achievement: 02/19/20 Potential to Achieve Goals: Fair Progress towards PT goals: Progressing toward goals    Frequency    Min 2X/week      PT Plan Discharge plan needs to be updated    Co-evaluation PT/OT/SLP Co-Evaluation/Treatment: Yes Reason for Co-Treatment: Complexity of the patient's impairments (multi-system involvement) PT goals addressed during session: Mobility/safety with mobility        AM-PAC PT "6 Clicks" Mobility   Outcome Measure  Help needed turning from your back to your side while in a flat bed without using bedrails?: A Lot Help needed moving from lying on your back to sitting on the side of a flat bed without using bedrails?: A Lot Help needed moving to and from a bed to a chair (including a wheelchair)?: Total Help needed standing up from a chair using your arms (e.g., wheelchair or bedside chair)?: Total Help needed to walk in hospital room?: Total Help needed climbing 3-5 steps with a railing? : Total 6 Click Score: 8    End of Session Equipment Utilized During Treatment: Oxygen Activity Tolerance: Patient limited by pain;Patient limited by fatigue Patient left: in bed;with call bell/phone within reach;with nursing/sitter in  room;with family/visitor present Nurse Communication: Mobility status PT Visit Diagnosis: Muscle weakness (generalized) (M62.81);Difficulty in walking, not elsewhere classified (R26.2);Pain;Unsteadiness on feet (R26.81) Pain - Right/Left: Right Pain - part of body: Leg     Time: 0938-1829 PT Time Calculation (min) (ACUTE ONLY): 25 min  Charges:                        Katherine Basset, SPT Baker Pierini 02/06/2020, 4:18 PM

## 2020-02-06 NOTE — Progress Notes (Signed)
Occupational Therapy Treatment Patient Details Name: [name] MRN: [id number] DOB: 1970/06/11 Today's Date: 02/06/2020    History of present illness 49 y.o. male with a known history of polysubstance abuse including alcohol abuse who presented to the emergency room with acute onset of increased generalized weakness and a mechanical fall.  Alcohol level 296 on admission.  Patient was transferred to ICU on 6 September due to increased work of breathing and accessory muscle use.  He was withdrawing from alcohol required intubation and mechanical ventilation. Extubated 9/12, Precedex drip stopped 9/14.   OT comments  [name] was seen for OT/PT co-treatment on this date. Upon arrival to room pt awake/alert, with caregiver present at bedside indicating pt needs bed linen change and new hospital gown after having a BM. Pt agreeable to OT/PT assisting with clean-up and willing to attempt functional mobility. He requires MAX A to come to sitting at EOB, and is noted to have increased difficulty maintaining seated balance. Pt endorses increased dizziness once seated upright which does not resolve during session. OT/PT/NSG student facilitate UB dressing (pt requires MOD A to don hospital gown), as well as bed level peri-care management with pt requiring +2 MIN/MOD A to roll R<>L in bed during session. Vitals monitored t/o session and pt spO2 remains WFL >92% with pt on 4L [place] t/o session. He is observed to consistently remove [place] from nose and requires assist to replace t/o session. Pt fatigues quickly and declines opportunity for further functional or therapeutic activity. Pt is significantly functionally limited by cardiopulmonary status, generalized weakness, and decreased activity tolerance. Given his poor tolerance for participation in therapy session, DC recommendation changed to STR, and frequency downgraded to 2x/week. Pt continues to benefit from skilled OT services to maximize return to PLOF and  minimize risk of future falls, injury, caregiver burden, and readmission. Will continue to follow POC as written.     Follow Up Recommendations  SNF    Equipment Recommendations  None recommended by OT    Recommendations for Other Services      Precautions / Restrictions Precautions Precautions: Fall Restrictions Weight Bearing Restrictions: No       Mobility Bed Mobility Overal bed mobility: Needs Assistance Bed Mobility: Rolling;Supine to Sit;Sit to Supine Rolling: +2 for physical assistance;Mod assist;Min assist   Supine to sit: Max assist Sit to supine: Max assist;+2 for physical assistance   General bed mobility comments: Patient able to perform rolling side to side for bed level toileting and pericare with OT/PT/RN requiring min A to perform, VC utilized for hand placement on bed rails to support in sideyling; Pt requires MAX A to advance BLE over EOB during sup<>sit as well as assit to advance hips forward to bring BLE onto floor this date.  Transfers                 General transfer comment: Did not attempt due to pt's dizziness/unsteadiness    Balance Overall balance assessment: Needs assistance Sitting-balance support: Single extremity supported;Bilateral upper extremity supported;Feet supported Sitting balance-[name] Scale: Poor Sitting balance - Comments: Pt seated balance variable t/o session. He is intermittently able to sit unsupported with BUE on bed, but during functional tasks and t/o session also requires MOD-MAX A from therapists 2/2 posterior lean. Postural control: Posterior lean     Standing balance comment: Unable to stand to assess due to patient's dizziness/unsteadiness  ADL either performed or assessed with clinical judgement   ADL Overall ADL's : Needs assistance/impaired                                       General ADL Comments: Pt significantly functionally limited by  cardiopulmonary status, dizziness with mobility, generalized weakness and decreased activity tolerance this date. He requires MAX A to come to sitting at EOB, as well as intermittent MOD-MAX A to maintain seated balance to don UB clothing (hospital gown). Unsafe/unable to attempt functional mobility this date, but anticipate +2 MOD-MAX A for STS/SPT. Pt requires +2 MAX A for bed level peri-care management after found to be soiled with BM.     Vision Baseline Vision/History: Wears glasses Wears Glasses: At all times Patient Visual Report: No change from baseline     Perception     Praxis      Cognition Arousal/Alertness: Awake/alert Behavior During Therapy: WFL for tasks assessed/performed;Flat affect Overall Cognitive Status: Within Functional Limits for tasks assessed                                 General Comments: Pt is alert and generally following 1 step VCs with increased time. His voice is very quiet/hoarse so he is at time difficult to understand which limits cognition assessment however he presents A&O to self, place, and limited situation.        Exercises Other Exercises Other Exercises: bedmobility training with VC for hand placement to improve technique and stability in sidelying for pericare/bed level toileting task Other Exercises: Grip strength x 10 secs BUE Other Exercises: OT/PT facilitate bed mobility, bed level peri-care management, rolling L<>R, SUP<>sit, and UB dressing this date.   Shoulder Instructions       General Comments Redness/bruising noted across posterior BLE this date.    Pertinent Vitals/ Pain       Pain Assessment: 0-10 Pain Score: 8  Pain Location: R LE Pain Descriptors / Indicators: Shooting;Sharp;Pins and needles;Tingling Pain Intervention(s): Limited activity within patient's tolerance;Monitored during session;Repositioned  Home Living                                          Prior  Functioning/Environment              Frequency  Min 2X/week        Progress Toward Goals  OT Goals(current goals can now be found in the care plan section)  Progress towards OT goals: Progressing toward goals  Acute Rehab OT Goals Patient Stated Goal: To get stronger and be independent again OT Goal Formulation: With patient Time For Goal Achievement: 02/19/20 Potential to Achieve Goals: Good  Plan Discharge plan needs to be updated;Frequency needs to be updated    Co-evaluation    PT/OT/SLP Co-Evaluation/Treatment: Yes Reason for Co-Treatment: Complexity of the patient's impairments (multi-system involvement);Necessary to address cognition/behavior during functional activity PT goals addressed during session: Mobility/safety with mobility;Balance OT goals addressed during session: ADL's and self-care      AM-PAC OT "6 Clicks" Daily Activity     Outcome Measure   Help from another person eating meals?: A Little Help from another person taking care of personal grooming?: A Lot Help from another person toileting, which  includes using toliet, bedpan, or urinal?: A Lot Help from another person bathing (including washing, rinsing, drying)?: A Lot Help from another person to put on and taking off regular upper body clothing?: A Lot Help from another person to put on and taking off regular lower body clothing?: A Lot 6 Click Score: 13    End of Session    OT Visit Diagnosis: Other abnormalities of gait and mobility (R26.89);History of falling (Z91.81);Muscle weakness (generalized) (M62.81);Pain Pain - Right/Left: Right Pain - part of body: Hip;Leg   Activity Tolerance Patient limited by fatigue   Patient Left in bed;with call bell/phone within reach;with bed alarm set;with nursing/sitter in room   Nurse Communication Mobility status        Time: 2671-2458 OT Time Calculation (min): 25 min  Charges: OT General Charges $OT Visit: 1 Visit OT Treatments $Self  Care/Home Management : 8-22 mins  Rockney Ghee, M.S., OTR/L Ascom: [phone] 02/06/20, 4:47 PM

## 2020-02-06 NOTE — Progress Notes (Addendum)
PROGRESS NOTE    [name]  RCV:[id number] DOB: 1970/11/22 DOA: 01/23/2020 PCP: Remi Haggard, FNP  Outpatient Specialists:     Brief Narrative:  From 9/14 ICU progress note: [name] an 49 y.o.malewith a known history of polysubstance abuse including alcohol abuse who presented to the emergency room with acute onset of increased generalized weakness and amechanicalfall.Alcohol level 296 on admission. Patient was transferred to ICU on 6 September due to increased work of breathing and accessory muscle use. He was withdrawing from alcohol required intubation and mechanical ventilation.  Events: 9/6 transferred to ICU for increased WOB, using accessory muscles to breathe with liver failure 9/7 remains on vent, liver failure, PICC line placed, brother updated 9/8 remains encephalopathic, liver failure, resp failure 9/10 remains on ventilator. Long discussion and update with brother. Sedatives switch to Precedex and fentanyl, continue CIWA scale 9/11still with some agitation when sedation is lightened. Intolerance to SBT due to agitation. 9/12 extubated 9/13 hypoxia improving 9/14 more alert awake, DT's improving, transferred out of ICU   Assessment & Plan:   Principal Problem:   Sepsis ([place]) Active Problems:   Acute blood loss anemia   GIB (gastrointestinal bleeding)   Transaminitis   Hyponatremia   Hypokalemia   Falls   Hip pain   Rhabdomyolysis   Hypovolemia   Hypotension   Alcohol abuse   Goals of care, counseling/discussion   Palliative care by specialist  # Hypoxic hypercapnic respiratory failure due to multiorgan liver failure and aspirationi pneumonia. Now extubated and breathing comfortably on room air - resolved, s/p treatment for CAP  # Acute diastolic heart failure - appears fluid overloaded today with sig LE edema - will start lasix 20 mg po qd  # Delirium tremens  - improving, s/p precedex - cont thiamine, lactulose,  rifaximin. Cont bid lactulose, 3 BMs last 24 hours per nursing  # Right gluteal hematoma - seen by ortho this hospitalization, advised no intervention - PVLs ordered by night team to eval for DVT, negative for DVT  # Liver failure - improving - f/u add-on LFTs      DVT prophylaxis: heparin Code Status: full Family Communication: girlfriend updated @ bedside Disposition Plan: likely SNF    Consultants:   GI and ortho signed off   Procedures:    Antimicrobials: s/p cefepime   Subjective: In bed arms shaking, somewhat confused. Girlfriend says steady progress, today looks better than rest of hospitalization. Denies pain. No vomiting or diarrhea.   Objective: Vitals:   02/05/20 2341 02/06/20 0322 02/06/20 0435 02/06/20 0541  BP: 137/70  (!) 148/71   Pulse: 79 81 85   Resp: 20  20   Temp: 99.4 F (37.4 C)  98.5 F (36.9 C)   TempSrc: Oral  Oral   SpO2: 99% 98% 98%   Weight:    117.3 kg  Height:        Intake/Output Summary (Last 24 hours) at 02/06/2020 0831 Last data filed at 02/05/2020 1800 Gross per 24 hour  Intake 577.76 ml  Output 511 ml  Net 66.76 ml   Filed Weights   02/01/20 0500 02/02/20 0500 02/06/20 0541  Weight: 119.7 kg 120.2 kg 117.3 kg    Examination:  General exam: arms shaking, no acute distress Respiratory system: Clear to auscultation. Respiratory effort normal. Cardiovascular system: S1 & S2 heard, RRR. No JVD, murmurs, rubs, gallops or clicks. 2+ LE edema Gastrointestinal system: abd moderately distended, non tender Central nervous system: Alert , oriented to  self Extremities: Symmetric 5 x 5 power. Skin: echymoses dependent side of b/l LEs right worse than left Psychiatry: unable to assess    Data Reviewed:   CBC: Recent Labs  Lab 02/01/20 0440 02/01/20 0945 02/02/20 0500 02/02/20 1357 02/03/20 2145 02/04/20 0430 02/04/20 0830 02/05/20 0440 02/06/20 0646  WBC 10.1  --  8.2  --   --  10.0  --  11.8* 15.0*  NEUTROABS  6.9  --  5.2  --   --  7.6  --  8.6* PENDING  HGB 8.9*   < > 9.6*   < > 9.6* 9.5* 9.5* 9.8* 9.5*  HCT 27.4*   < > 29.8*   < > 29.9* 29.0* 29.2* 31.8* 30.2*  MCV 105.8*  --  101.4*  --   --  102.1*  --  107.1* 107.9*  PLT 136*  --  153  --   --  171  --  167 191   < > = values in this interval not displayed.   Basic Metabolic Panel: Recent Labs  Lab 02/01/20 0440 02/01/20 0440 02/02/20 0500 02/03/20 0014 02/04/20 0430 02/05/20 0440 02/06/20 0646  NA 145  --  144 145  --  148* 145  K 4.2  --  4.4 4.6  --  4.5 3.7  CL 109  --  111 112*  --  113* 110  CO2 28  --  26 28  --  26 25  GLUCOSE 99  --  116* 114*  --  105* 108*  BUN 37*  --  46* 42*  --  46* 31*  CREATININE 0.94  --  1.06 1.03  --  0.91 0.86  CALCIUM 7.6*  --  7.8* 8.1*  --  8.5* 8.9  MG 1.8   < > 1.9 1.8 1.9 2.0 1.8  PHOS 3.2   < > 3.0 3.0 3.2 3.5 3.5  3.5   < > = values in this interval not displayed.   GFR: Estimated Creatinine Clearance: 127.3 mL/min (by C-G formula based on SCr of 0.86 mg/dL). Liver Function Tests: Recent Labs  Lab 01/31/20 0355 02/01/20 0440 02/02/20 0500 02/05/20 0440 02/06/20 0646  AST 91* 118* 122*  --   --   ALT 36 46* 49*  --   --   ALKPHOS 47 62 69  --   --   BILITOT 9.5* 10.1* 8.8*  --   --   PROT 5.8* 5.9* 6.2*  --   --   ALBUMIN 2.4* 2.3* 2.3* 2.7* 2.7*   No results for input(s): LIPASE, AMYLASE in the last 168 hours. Recent Labs  Lab 02/02/20 1800  AMMONIA 37*   Coagulation Profile: Recent Labs  Lab 02/01/20 1028  INR 1.6*   Cardiac Enzymes: Recent Labs  Lab 02/02/20 1357  CKTOTAL 210   BNP (last 3 results) No results for input(s): PROBNP in the last 8760 hours. HbA1C: No results for input(s): HGBA1C in the last 72 hours. CBG: Recent Labs  Lab 02/05/20 0729 02/05/20 1127 02/05/20 1609 02/05/20 1947 02/05/20 2342  GLUCAP 89 102* 103* 101* 114*   Lipid Profile: No results for input(s): CHOL, HDL, LDLCALC, TRIG, CHOLHDL, LDLDIRECT in the last 72  hours. Thyroid Function Tests: No results for input(s): TSH, T4TOTAL, FREET4, T3FREE, THYROIDAB in the last 72 hours. Anemia Panel: No results for input(s): VITAMINB12, FOLATE, FERRITIN, TIBC, IRON, RETICCTPCT in the last 72 hours. Urine analysis:    Component Value Date/Time   COLORURINE AMBER (A) 02/02/2020 1520  APPEARANCEUR CLEAR (A) 02/02/2020 1520   LABSPEC 1.016 02/02/2020 1520   PHURINE 5.0 02/02/2020 1520   GLUCOSEU NEGATIVE 02/02/2020 1520   HGBUR SMALL (A) 02/02/2020 1520   BILIRUBINUR NEGATIVE 02/02/2020 [place] 02/02/2020 [place] 02/02/2020 1520   NITRITE NEGATIVE 02/02/2020 [place] 02/02/2020 1520   Sepsis Labs: $[email or web address]$ (procalcitonin:4,lacticidven:4)  ) Recent Results (from the past 240 hour(s))  MRSA PCR Screening     Status: None   Collection Time: 01/28/20  2:19 PM   Specimen: Nasal Mucosa; Nasopharyngeal  Result Value Ref Range Status   MRSA by PCR NEGATIVE NEGATIVE Final    Comment:        The GeneXpert MRSA Assay (FDA approved for NASAL specimens only), is one component of a comprehensive MRSA colonization surveillance program. It is not intended to diagnose MRSA infection nor to guide or monitor treatment for MRSA infections. Performed at [facility], [address] [place], [place] [address]          Radiology Studies: No results found.      Scheduled Meds: . chlorhexidine gluconate (MEDLINE KIT)  15 mL Mouth Rinse BID  . Chlorhexidine Gluconate Cloth  6 each Topical Daily  . feeding supplement (ENSURE ENLIVE)  237 mL Oral TID BM  . folic acid  1 mg Oral Daily  . heparin  5,000 Units Subcutaneous Q8H  . lactulose  30 g Oral BID  . metoprolol tartrate  12.5 mg Oral BID  . multivitamin with minerals  1 tablet Oral Daily  . pantoprazole (PROTONIX) IV  40 mg Intravenous Q12H  . rifaximin  550 mg Oral BID  . sodium chloride flush  10-40 mL Intracatheter Q12H  .  sodium chloride flush  10-40 mL Intracatheter Q12H   Continuous Infusions: . sodium chloride 5 mL/hr at 02/05/20 1400  . sodium chloride    . dextrose 50 mL/hr at 02/05/20 1800  . thiamine injection Stopped (02/05/20 1035)     LOS: 14 days    Time spent: 68 min   Desma Maxim, MD [facility] Pager [phone]  If 7PM-7AM, please contact night-coverage [email or web address] Password [facility] 02/06/2020, 8:31 AM

## 2020-02-06 NOTE — Progress Notes (Signed)
Inpatient Rehab Admissions Coordinator Note:   Per therapy recommendations, pt was screened for CIR candidacy by Estill Dooms, PT, DPT.  At this time we are recommending a CIR consult and I will request an order per our protocol.  Please contact me with questions.   Estill Dooms, PT, DPT [phone] 02/06/20 12:20 PM

## 2020-02-06 NOTE — Progress Notes (Signed)
OT Cancellation Note  Patient Details Name: [name] MRN: [id number] DOB: 1970/08/20   Cancelled Treatment:    Reason Eval/Treat Not Completed: Medical issues which prohibited therapy;Other (comment). OT continues to follow pt for tx. Chart reviewed this am, and pt noted to be pending BLE ultrasound to r/o DVT. Will hold until imaging complete & pt cleared for participation with therapy.  Rockney Ghee, M.S., OTR/L Ascom: [phone] 02/06/20, 8:09 AM

## 2020-02-07 LAB — GLUCOSE, CAPILLARY
Glucose-Capillary: 85 mg/dL (ref 70–99)
Glucose-Capillary: 86 mg/dL (ref 70–99)
Glucose-Capillary: 91 mg/dL (ref 70–99)
Glucose-Capillary: 99 mg/dL (ref 70–99)
Glucose-Capillary: 97 mg/dL (ref 70–99)

## 2020-02-07 LAB — HEPATIC FUNCTION PANEL
ALT: 83 U/L — ABNORMAL HIGH (ref 0–44)
AST: 154 U/L — ABNORMAL HIGH (ref 15–41)
Albumin: 2.7 g/dL — ABNORMAL LOW (ref 3.5–5.0)
Alkaline Phosphatase: 46 U/L (ref 38–126)
Bilirubin, Direct: 2.9 mg/dL — ABNORMAL HIGH (ref 0.0–0.2)
Indirect Bilirubin: 6.2 mg/dL — ABNORMAL HIGH (ref 0.3–0.9)
Total Bilirubin: 9.1 mg/dL — ABNORMAL HIGH (ref 0.3–1.2)
Total Protein: 6.5 g/dL (ref 6.5–8.1)

## 2020-02-07 LAB — RENAL FUNCTION PANEL
Albumin: 2.7 g/dL — ABNORMAL LOW (ref 3.5–5.0)
Anion gap: 11 (ref 5–15)
BUN: 22 mg/dL — ABNORMAL HIGH (ref 6–20)
CO2: 24 mmol/L (ref 22–32)
Calcium: 8.9 mg/dL (ref 8.9–10.3)
Chloride: 108 mmol/L (ref 98–111)
Creatinine, Ser: 0.68 mg/dL (ref 0.61–1.24)
GFR calc Af Amer: >60 mL/min (ref >60)
GFR calc non Af Amer: >60 mL/min (ref >60)
Glucose, Bld: 92 mg/dL (ref 70–99)
Phosphorus: 3.8 mg/dL (ref 2.5–4.6)
Potassium: 3.6 mmol/L (ref 3.5–5.1)
Sodium: 143 mmol/L (ref 135–145)

## 2020-02-07 LAB — CBC WITH DIFFERENTIAL/PLATELET
Abs Immature Granulocytes: 0.29 10*3/uL — ABNORMAL HIGH (ref 0.00–0.07)
Basophils Absolute: 0.1 10*3/uL (ref 0.0–0.1)
Basophils Relative: 1 %
Eosinophils Absolute: 0.2 10*3/uL (ref 0.0–0.5)
Eosinophils Relative: 2 %
HCT: 30.6 % — ABNORMAL LOW (ref 39.0–52.0)
Hemoglobin: 9.6 g/dL — ABNORMAL LOW (ref 13.0–17.0)
Immature Granulocytes: 2 %
Lymphocytes Relative: 17 %
Lymphs Abs: 2.3 10*3/uL (ref 0.7–4.0)
MCH: 34 pg (ref 26.0–34.0)
MCHC: 31.4 g/dL (ref 30.0–36.0)
MCV: 108.5 fL — ABNORMAL HIGH (ref 80.0–100.0)
Monocytes Absolute: 1.3 10*3/uL — ABNORMAL HIGH (ref 0.1–1.0)
Monocytes Relative: 9 %
Neutro Abs: 9.3 10*3/uL — ABNORMAL HIGH (ref 1.7–7.7)
Neutrophils Relative %: 69 %
Platelets: 177 10*3/uL (ref 150–400)
RBC: 2.82 MIL/uL — ABNORMAL LOW (ref 4.22–5.81)
RDW: 27 % — ABNORMAL HIGH (ref 11.5–15.5)
Smear Review: NORMAL
WBC: 13.3 10*3/uL — ABNORMAL HIGH (ref 4.0–10.5)
nRBC: 0.5 % — ABNORMAL HIGH (ref 0.0–0.2)

## 2020-02-07 LAB — VITAMIN B12: Vitamin B-12: 1060 pg/mL — ABNORMAL HIGH (ref 180–914)

## 2020-02-07 LAB — PHOSPHORUS: Phosphorus: 3.9 mg/dL (ref 2.5–4.6)

## 2020-02-07 LAB — MAGNESIUM: Magnesium: 1.6 mg/dL — ABNORMAL LOW (ref 1.7–2.4)

## 2020-02-07 LAB — FOLATE: Folate: 10.3 ng/mL (ref >5.9)

## 2020-02-07 LAB — PROCALCITONIN: Procalcitonin: 0.19 ng/mL

## 2020-02-07 MED ORDER — DILTIAZEM HCL-DEXTROSE 125-5 MG/125ML-% IV SOLN (PREMIX)
5.0000 mg/h | INTRAVENOUS | Status: DC
  Administered 2020-02-07: 5 mg/h via INTRAVENOUS
  Administered 2020-02-08: 10 mg/h via INTRAVENOUS
  Administered 2020-02-08: 15 mg/h via INTRAVENOUS
  Filled 2020-02-07: qty 125

## 2020-02-07 MED ORDER — MAGNESIUM SULFATE 2 GM/50ML IV SOLN
2.0000 g | Freq: Once | INTRAVENOUS | Status: AC
  Administered 2020-02-07: 2 g via INTRAVENOUS
  Filled 2020-02-07: qty 50

## 2020-02-07 MED ORDER — DILTIAZEM HCL 25 MG/5ML IV SOLN
15.0000 mg | Freq: Once | INTRAVENOUS | Status: AC
  Administered 2020-02-07: 15 mg via INTRAVENOUS
  Filled 2020-02-07 (×2): qty 5

## 2020-02-07 MED ORDER — DILTIAZEM LOAD VIA INFUSION
15.0000 mg | Freq: Once | INTRAVENOUS | Status: AC
  Administered 2020-02-07: 15 mg via INTRAVENOUS
  Filled 2020-02-07: qty 15

## 2020-02-07 NOTE — Progress Notes (Signed)
Diltiazem 15mg  given IVP at this time.

## 2020-02-07 NOTE — Progress Notes (Signed)
PROGRESS NOTE    [name]  QAS:[id number] DOB: 02/24/1971 DOA: 01/23/2020 PCP: Armando Gang, FNP  Outpatient Specialists:     Brief Narrative:  [name] an 49 y.o.malewith a known history of polysubstance abuse including alcohol abuse who presented to the emergency room with acute onset of increased generalized weakness and amechanicalfall.Alcohol level 296 on admission. Patient was transferred to ICU on 6 September due to increased work of breathing and accessory muscle use. He was withdrawing from alcohol required intubation and mechanical ventilation.  Events: 9/6 transferred to ICU for increased WOB, using accessory muscles to breathe with liver failure 9/7 remains on vent, liver failure, PICC line placed, brother updated 9/8 remains encephalopathic, liver failure, resp failure 9/10 remains on ventilator. Long discussion and update with brother. Sedatives switch to Precedex and fentanyl, continue CIWA scale 9/11still with some agitation when sedation is lightened. Intolerance to SBT due to agitation. 9/12 extubated 9/13 hypoxia improving 9/14 more alert awake, DT's improving, transferred out of ICU 9/15 more alert, DTs continue to improve 9/16 more alert, DTs improving still   Assessment & Plan:   Principal Problem:   Sepsis (HCC) Active Problems:   Acute blood loss anemia   GIB (gastrointestinal bleeding)   Transaminitis   Hyponatremia   Hypokalemia   Falls   Hip pain   Rhabdomyolysis   Hypovolemia   Hypotension   Alcohol abuse   Goals of care, counseling/discussion   Palliative care by specialist  # Hypoxic hypercapnic respiratory failure due to multiorgan liver failure and aspirationi pneumonia. Now extubated and breathing comfortably on 2 L . S/p treatment for CAP - nursing to see about discontinuing O2  # Acute diastolic heart failure - appears fluid overloaded today with sig LE edema - have started lasix 20 po qd, pt  reports good UOP, cr stable  # Macrocytic anemia - H 9s, stable - check b12/folate  # Delirium tremens  - improving, s/p precedex - cont thiamine, lactulose, rifaximin. Cont bid lactulose, 3 BMs last 24 hours per nursing  # Right gluteal hematoma - seen by ortho this hospitalization, advised no intervention. With significant dependent bruising right leg. - PVLs ordered by night team to eval for DVT, negative for DVT  # Liver failure - improving, GI has signed off for now.    DVT prophylaxis: heparin Code Status: full Family Communication: girlfriend updated @ bedside Disposition Plan: needs SNF, has no insurance, care mgmt to look into charity outpt pt/ot. Has been declined by inpatient rehab   Consultants:   GI and ortho signed off   Procedures:    Antimicrobials: s/p cefepime   Subjective: In bed continues right leg pain unchanged, no cough or sob, tremor improved, has good appetite.   Objective: Vitals:   02/07/20 0648 02/07/20 0736 02/07/20 1048 02/07/20 1126  BP: (!) 146/73 138/71 (!) 146/76   Pulse: 70 80 75 79  Resp: 20 19    Temp: 99.1 F (37.3 C) 99.7 F (37.6 C)    TempSrc: Oral Oral    SpO2: 96% 97% 98% 94%  Weight:      Height:        Intake/Output Summary (Last 24 hours) at 02/07/2020 1135 Last data filed at 02/07/2020 1042 Gross per 24 hour  Intake 250 ml  Output 0 ml  Net 250 ml   Filed Weights   02/01/20 0500 02/02/20 0500 02/06/20 0541  Weight: 119.7 kg 120.2 kg 117.3 kg    Examination:  General  exam: mild tremor, NAD Respiratory system: Clear to auscultation. Respiratory effort normal. Cardiovascular system: S1 & S2 heard, RRR. No JVD, murmurs, rubs, gallops or clicks. 2+ LE edema Gastrointestinal system: abd moderately distended, non tender Central nervous system: Alert , oriented to self Extremities: Symmetric 5 x 5 power. Skin: echymoses dependent side of b/l LEs right worse than left Psychiatry: unable to assess    Data  Reviewed:   CBC: Recent Labs  Lab 02/02/20 0500 02/02/20 1357 02/04/20 0430 02/04/20 0830 02/05/20 0440 02/06/20 0646 02/07/20 0739  WBC 8.2  --  10.0  --  11.8* 15.0* 13.3*  NEUTROABS 5.2  --  7.6  --  8.6* 10.9* 9.3*  HGB 9.6*   < > 9.5* 9.5* 9.8* 9.5* 9.6*  HCT 29.8*   < > 29.0* 29.2* 31.8* 30.2* 30.6*  MCV 101.4*  --  102.1*  --  107.1* 107.9* 108.5*  PLT 153  --  171  --  167 191 177   < > = values in this interval not displayed.   Basic Metabolic Panel: Recent Labs  Lab 02/02/20 0500 02/02/20 0500 02/03/20 0014 02/04/20 0430 02/05/20 0440 02/06/20 0646 02/07/20 0739  NA 144  --  145  --  148* 145 143  K 4.4  --  4.6  --  4.5 3.7 3.6  CL 111  --  112*  --  113* 110 108  CO2 26  --  28  --  $R'26 25 24  'hw$ GLUCOSE 116*  --  114*  --  105* 108* 92  BUN 46*  --  42*  --  46* 31* 22*  CREATININE 1.06  --  1.03  --  0.91 0.86 0.68  CALCIUM 7.8*  --  8.1*  --  8.5* 8.9 8.9  MG 1.9   < > 1.8 1.9 2.0 1.8 1.6*  PHOS 3.0   < > 3.0 3.2 3.5 3.5  3.5 3.8  3.9   < > = values in this interval not displayed.   GFR: Estimated Creatinine Clearance: 136.8 mL/min (by C-G formula based on SCr of 0.68 mg/dL). Liver Function Tests: Recent Labs  Lab 02/01/20 0440 02/01/20 0440 02/02/20 0500 02/05/20 0440 02/06/20 0500 02/06/20 0646 02/07/20 0739  AST 118*  --  122*  --  145*  --  154*  ALT 46*  --  49*  --  69*  --  83*  ALKPHOS 62  --  69  --  53  --  46  BILITOT 10.1*  --  8.8*  --  8.5*  --  9.1*  PROT 5.9*  --  6.2*  --  6.6  --  6.5  ALBUMIN 2.3*   < > 2.3* 2.7* 2.7* 2.7* 2.7*  2.7*   < > = values in this interval not displayed.   No results for input(s): LIPASE, AMYLASE in the last 168 hours. Recent Labs  Lab 02/02/20 1800  AMMONIA 37*   Coagulation Profile: Recent Labs  Lab 02/01/20 1028  INR 1.6*   Cardiac Enzymes: Recent Labs  Lab 02/02/20 1357  CKTOTAL 210   BNP (last 3 results) No results for input(s): PROBNP in the last 8760 hours. HbA1C: No  results for input(s): HGBA1C in the last 72 hours. CBG: Recent Labs  Lab 02/06/20 1201 02/06/20 1642 02/06/20 2051 02/07/20 0633 02/07/20 0742  GLUCAP 100* 103* 101* 85 86   Lipid Profile: No results for input(s): CHOL, HDL, LDLCALC, TRIG, CHOLHDL, LDLDIRECT in the last 72 hours.  Thyroid Function Tests: No results for input(s): TSH, T4TOTAL, FREET4, T3FREE, THYROIDAB in the last 72 hours. Anemia Panel: No results for input(s): VITAMINB12, FOLATE, FERRITIN, TIBC, IRON, RETICCTPCT in the last 72 hours. Urine analysis:    Component Value Date/Time   COLORURINE AMBER (A) 02/02/2020 1520   APPEARANCEUR CLEAR (A) 02/02/2020 1520   LABSPEC 1.016 02/02/2020 1520   PHURINE 5.0 02/02/2020 1520   GLUCOSEU NEGATIVE 02/02/2020 1520   HGBUR SMALL (A) 02/02/2020 1520   BILIRUBINUR NEGATIVE 02/02/2020 1520   KETONESUR NEGATIVE 02/02/2020 1520   PROTEINUR NEGATIVE 02/02/2020 1520   NITRITE NEGATIVE 02/02/2020 1520   LEUKOCYTESUR NEGATIVE 02/02/2020 1520   Sepsis Labs: $RemoveBefo'@LABRCNTIP'[id number]$ (procalcitonin:4,lacticidven:4)  ) Recent Results (from the past 240 hour(s))  MRSA PCR Screening     Status: None   Collection Time: 01/28/20  2:19 PM   Specimen: Nasal Mucosa; Nasopharyngeal  Result Value Ref Range Status   MRSA by PCR NEGATIVE NEGATIVE Final    Comment:        The GeneXpert MRSA Assay (FDA approved for NASAL specimens only), is one component of a comprehensive MRSA colonization surveillance program. It is not intended to diagnose MRSA infection nor to guide or monitor treatment for MRSA infections. Performed at [facility], [address] [place], [place] [address]          Radiology Studies: US Venous Img Lower Bilateral (DVT)  Result Date: 02/06/2020 CLINICAL DATA:  Bilateral lower extremity pain and edema. Recent fall. Evaluate for DVT. EXAM: BILATERAL LOWER EXTREMITY VENOUS DOPPLER ULTRASOUND TECHNIQUE: Gray-scale sonography with graded compression, as well as  color Doppler and duplex ultrasound were performed to evaluate the lower extremity deep venous systems from the level of the common femoral vein and including the common femoral, femoral, profunda femoral, popliteal and calf veins including the posterior tibial, peroneal and gastrocnemius veins when visible. The superficial great saphenous vein was also interrogated. Spectral Doppler was utilized to evaluate flow at rest and with distal augmentation maneuvers in the common femoral, femoral and popliteal veins. COMPARISON:  None. FINDINGS: RIGHT LOWER EXTREMITY Common Femoral Vein: No evidence of thrombus. Normal compressibility, respiratory phasicity and response to augmentation. Saphenofemoral Junction: No evidence of thrombus. Normal compressibility and flow on color Doppler imaging. Profunda Femoral Vein: No evidence of thrombus. Normal compressibility and flow on color Doppler imaging. Femoral Vein: No evidence of thrombus. Normal compressibility, respiratory phasicity and response to augmentation. Popliteal Vein: No evidence of thrombus. Normal compressibility, respiratory phasicity and response to augmentation. Calf Veins: No evidence of thrombus. Normal compressibility and flow on color Doppler imaging. Superficial Great Saphenous Vein: No evidence of thrombus. Normal compressibility. Venous Reflux:  None. Other Findings:  None. LEFT LOWER EXTREMITY Common Femoral Vein: No evidence of thrombus. Normal compressibility, respiratory phasicity and response to augmentation. Saphenofemoral Junction: No evidence of thrombus. Normal compressibility and flow on color Doppler imaging. Profunda Femoral Vein: No evidence of thrombus. Normal compressibility and flow on color Doppler imaging. Femoral Vein: No evidence of thrombus. Normal compressibility, respiratory phasicity and response to augmentation. Popliteal Vein: No evidence of thrombus. Normal compressibility, respiratory phasicity and response to augmentation.  Calf Veins: No evidence of thrombus. Normal compressibility and flow on color Doppler imaging. Superficial Great Saphenous Vein: No evidence of thrombus. Normal compressibility. Venous Reflux:  None. Other Findings: Note is made of an approximately 22.1 x 5.7 x 3.0 cm minimally complex fluid collection extending from the left popliteal fossa. IMPRESSION: 1. No evidence of DVT within either lower extremity. 2.  Note made of an approximately 22.1 cm minimally complex fluid collection extending from the left popliteal fossa, nonspecific with differential considerations including a leaking Baker's cyst though given provided history of recent fall conceivably a hematoma could have a similar appearance. Clinical correlation is advised. Electronically Signed   By: Sandi Mariscal M.D.   On: 02/06/2020 11:50        Scheduled Meds: . chlorhexidine gluconate (MEDLINE KIT)  15 mL Mouth Rinse BID  . Chlorhexidine Gluconate Cloth  6 each Topical Daily  . feeding supplement (ENSURE ENLIVE)  237 mL Oral TID BM  . folic acid  1 mg Oral Daily  . furosemide  20 mg Oral Daily  . heparin  5,000 Units Subcutaneous Q8H  . lactulose  30 g Oral BID  . metoprolol tartrate  12.5 mg Oral BID  . multivitamin with minerals  1 tablet Oral Daily  . pantoprazole (PROTONIX) IV  40 mg Intravenous Q12H  . rifaximin  550 mg Oral BID  . sodium chloride flush  10-40 mL Intracatheter Q12H  . sodium chloride flush  10-40 mL Intracatheter Q12H  . thiamine  100 mg Oral Daily  . traMADol  50 mg Oral Q6H   Continuous Infusions: . sodium chloride 5 mL/hr at 02/05/20 1400  . sodium chloride    . magnesium sulfate bolus IVPB       LOS: 15 days    Time spent: 45 min   Desma Maxim, MD [facility] Pager [phone]  If 7PM-7AM, please contact night-coverage [email or web address] Password [facility] 02/07/2020, 11:35 AM

## 2020-02-07 NOTE — Progress Notes (Signed)
Tele called to report that patient has gone into afib rvr with a rate of 150's.  Pt assessed and heart rate is fast and irregular.  See vs's.  EKG done.  Dr.  Para March notified.  Charge nurse, Alisa aware.

## 2020-02-07 NOTE — Progress Notes (Addendum)
PROGRESS NOTE    [name]  GQQ:[id number] DOB: December 05, 1970 DOA: 01/23/2020 PCP: Remi Haggard, FNP   Brief Narrative:    10:37pm Telemetry reports Rapid a fib at rate of 170. BP 140/73 Diltiazem $RemoveBeforeDE'15mg'[id number]$  Iv ordered 11:20 No improvement so bolus and infusion started  4:23 Patient developed fever, tachypnea, still tachycardic. UA, CXR blood work ordered to eval for possible sepsis given fever tachycardia.  Chest x-ray showing mild pulmonary vascular congestion and Lasix 20 mg ordered.  No pneumonia  6:25 AM Patient converted to normal sinus rhythm at rate of 75 and diltiazem infusion discontinued.  Continue metoprolol Sepsis diagnosis questionable.  Labs still pending Empiric antibiotics for sepsis of unknown source ordered IV fluids in spite of vascular congestion seen earlier per sepsis protocol and will treat if further signs of overload Continue to monitor closely    Assessment & Plan:  Rapid A. fib -Please see above under brief narrative for treatment.  Essentially patient was placed on diltiazem infusion during the night for new onset A. fib, discontinued after he converted back to normal sinus rhythm and converted to Possible sepsis -Patient met sepsis criteria.  Work-up still pending.  Started on antibiotics for sepsis of unknown source as well as IV fluids in spite of chest x-ray finding showing possible vascular congestion      Principal Problem:   Sepsis ([place]) Active Problems:   Acute blood loss anemia   GIB (gastrointestinal bleeding)   Transaminitis   Hyponatremia   Hypokalemia   Falls   Hip pain   Rhabdomyolysis   Hypovolemia   Hypotension   Alcohol abuse   Goals of care, counseling/discussion   Palliative care by specialist    Objective: Vitals:   02/08/20 0407 02/08/20 0453 02/08/20 0500 02/08/20 0600  BP: 134/78  (!) 146/90 137/75  Pulse: (!) 107  (!) 103 79  Resp:    (!) 44  Temp: 100.3 F (37.9 C) 98.9 F (37.2 C) 98.9 F  (37.2 C)   TempSrc: Oral Oral Oral   SpO2: 95%     Weight: 117.4 kg     Height:        Intake/Output Summary (Last 24 hours) at 02/08/2020 0623 Last data filed at 02/08/2020 0500 Gross per 24 hour  Intake 933.89 ml  Output 151 ml  Net 782.89 ml   Filed Weights   02/02/20 0500 02/06/20 0541 02/08/20 0407  Weight: 120.2 kg 117.3 kg 117.4 kg    Examination:  General exam: Patient tachypneic, diaphoretic, ill-appearing, lethargic but arousable, appears dehydrated.  Jaundiced Respiratory system: .  Increased work of breathing.  Few bibasilar rales Cardiovascular system: Tachycardic and irregular gastrointestinal system: Abdomen is nondistended, soft and nontender. No organomegaly or masses felt. Normal bowel sounds heard. Central nervous system: Lethargic no focal neurological deficits. Extremities: Symmetric 5 x 5 power. Skin: Jaundice    Data Reviewed: I have personally reviewed following labs and imaging studies  CBC: Recent Labs  Lab 02/04/20 0430 02/04/20 0430 02/04/20 0830 02/05/20 0440 02/06/20 0646 02/07/20 0739 02/08/20 0545  WBC 10.0  --   --  11.8* 15.0* 13.3* 13.2*  NEUTROABS 7.6  --   --  8.6* 10.9* 9.3* PENDING  HGB 9.5*   < > 9.5* 9.8* 9.5* 9.6* 10.6*  HCT 29.0*   < > 29.2* 31.8* 30.2* 30.6* 32.7*  MCV 102.1*  --   --  107.1* 107.9* 108.5* 104.8*  PLT 171  --   --  167 191 177  167   < > = values in this interval not displayed.   Basic Metabolic Panel: Recent Labs  Lab 02/02/20 0500 02/02/20 0500 02/03/20 0014 02/04/20 0430 02/05/20 0440 02/06/20 0646 02/07/20 0739  NA 144  --  145  --  148* 145 143  K 4.4  --  4.6  --  4.5 3.7 3.6  CL 111  --  112*  --  113* 110 108  CO2 26  --  28  --  $R'26 25 24  'Dc$ GLUCOSE 116*  --  114*  --  105* 108* 92  BUN 46*  --  42*  --  46* 31* 22*  CREATININE 1.06  --  1.03  --  0.91 0.86 0.68  CALCIUM 7.8*  --  8.1*  --  8.5* 8.9 8.9  MG 1.9   < > 1.8 1.9 2.0 1.8 1.6*  PHOS 3.0   < > 3.0 3.2 3.5 3.5  3.5 3.8  3.9    < > = values in this interval not displayed.   GFR: Estimated Creatinine Clearance: 136.8 mL/min (by C-G formula based on SCr of 0.68 mg/dL). Liver Function Tests: Recent Labs  Lab 02/02/20 0500 02/05/20 0440 02/06/20 0500 02/06/20 0646 02/07/20 0739  AST 122*  --  145*  --  154*  ALT 49*  --  69*  --  83*  ALKPHOS 69  --  53  --  46  BILITOT 8.8*  --  8.5*  --  9.1*  PROT 6.2*  --  6.6  --  6.5  ALBUMIN 2.3* 2.7* 2.7* 2.7* 2.7*  2.7*   No results for input(s): LIPASE, AMYLASE in the last 168 hours. Recent Labs  Lab 02/02/20 1800  AMMONIA 37*   Coagulation Profile: Recent Labs  Lab 02/01/20 1028  INR 1.6*   Cardiac Enzymes: Recent Labs  Lab 02/02/20 1357  CKTOTAL 210   BNP (last 3 results) No results for input(s): PROBNP in the last 8760 hours. HbA1C: No results for input(s): HGBA1C in the last 72 hours. CBG: Recent Labs  Lab 02/07/20 0742 02/07/20 1138 02/07/20 1620 02/07/20 2128 02/08/20 0410  GLUCAP 86 91 99 97 96   Lipid Profile: No results for input(s): CHOL, HDL, LDLCALC, TRIG, CHOLHDL, LDLDIRECT in the last 72 hours. Thyroid Function Tests: No results for input(s): TSH, T4TOTAL, FREET4, T3FREE, THYROIDAB in the last 72 hours. Anemia Panel: Recent Labs    02/07/20 0739  VITAMINB12 1,060*  FOLATE 10.3   Sepsis Labs: Recent Labs  Lab 02/05/20 0440 02/06/20 0646 02/07/20 0739  PROCALCITON 0.26 0.21 0.19    No results found for this or any previous visit (from the past 240 hour(s)).       Radiology Studies: US Venous Img Lower Bilateral (DVT)  Result Date: 02/06/2020 CLINICAL DATA:  Bilateral lower extremity pain and edema. Recent fall. Evaluate for DVT. EXAM: BILATERAL LOWER EXTREMITY VENOUS DOPPLER ULTRASOUND TECHNIQUE: Gray-scale sonography with graded compression, as well as color Doppler and duplex ultrasound were performed to evaluate the lower extremity deep venous systems from the level of the common femoral vein and  including the common femoral, femoral, profunda femoral, popliteal and calf veins including the posterior tibial, peroneal and gastrocnemius veins when visible. The superficial great saphenous vein was also interrogated. Spectral Doppler was utilized to evaluate flow at rest and with distal augmentation maneuvers in the common femoral, femoral and popliteal veins. COMPARISON:  None. FINDINGS: RIGHT LOWER EXTREMITY Common Femoral Vein: No evidence of thrombus. Normal  compressibility, respiratory phasicity and response to augmentation. Saphenofemoral Junction: No evidence of thrombus. Normal compressibility and flow on color Doppler imaging. Profunda Femoral Vein: No evidence of thrombus. Normal compressibility and flow on color Doppler imaging. Femoral Vein: No evidence of thrombus. Normal compressibility, respiratory phasicity and response to augmentation. Popliteal Vein: No evidence of thrombus. Normal compressibility, respiratory phasicity and response to augmentation. Calf Veins: No evidence of thrombus. Normal compressibility and flow on color Doppler imaging. Superficial Great Saphenous Vein: No evidence of thrombus. Normal compressibility. Venous Reflux:  None. Other Findings:  None. LEFT LOWER EXTREMITY Common Femoral Vein: No evidence of thrombus. Normal compressibility, respiratory phasicity and response to augmentation. Saphenofemoral Junction: No evidence of thrombus. Normal compressibility and flow on color Doppler imaging. Profunda Femoral Vein: No evidence of thrombus. Normal compressibility and flow on color Doppler imaging. Femoral Vein: No evidence of thrombus. Normal compressibility, respiratory phasicity and response to augmentation. Popliteal Vein: No evidence of thrombus. Normal compressibility, respiratory phasicity and response to augmentation. Calf Veins: No evidence of thrombus. Normal compressibility and flow on color Doppler imaging. Superficial Great Saphenous Vein: No evidence of  thrombus. Normal compressibility. Venous Reflux:  None. Other Findings: Note is made of an approximately 22.1 x 5.7 x 3.0 cm minimally complex fluid collection extending from the left popliteal fossa. IMPRESSION: 1. No evidence of DVT within either lower extremity. 2. Note made of an approximately 22.1 cm minimally complex fluid collection extending from the left popliteal fossa, nonspecific with differential considerations including a leaking Baker's cyst though given provided history of recent fall conceivably a hematoma could have a similar appearance. Clinical correlation is advised. Electronically Signed   By: Sandi Mariscal M.D.   On: 02/06/2020 11:50   DG Chest Port 1 View  Result Date: 02/08/2020 CLINICAL DATA:  Fever EXAM: PORTABLE CHEST 1 VIEW COMPARISON:  02/01/2020 FINDINGS: Cardiomegaly, vascular congestion. Bilateral hilar prominence, favor vascular. Diffuse interstitial prominence throughout the lungs, likely edema. No effusions. No acute bony abnormality. IMPRESSION: Cardiomegaly with vascular congestion and interstitial prominence, likely mild edema. Bilateral hilar prominence, favor vascular. Electronically Signed   By: Rolm Baptise M.D.   On: 02/08/2020 05:23        Scheduled Meds: . chlorhexidine gluconate (MEDLINE KIT)  15 mL Mouth Rinse BID  . Chlorhexidine Gluconate Cloth  6 each Topical Daily  . feeding supplement (ENSURE ENLIVE)  237 mL Oral TID BM  . folic acid  1 mg Oral Daily  . furosemide  20 mg Oral Daily  . heparin  5,000 Units Subcutaneous Q8H  . lactulose  30 g Oral BID  . metoprolol tartrate  12.5 mg Oral BID  . multivitamin with minerals  1 tablet Oral Daily  . pantoprazole (PROTONIX) IV  40 mg Intravenous Q12H  . rifaximin  550 mg Oral BID  . sodium chloride flush  10-40 mL Intracatheter Q12H  . sodium chloride flush  10-40 mL Intracatheter Q12H  . thiamine  100 mg Oral Daily  . traMADol  50 mg Oral Q6H   Continuous Infusions: . sodium chloride Stopped  (02/07/20 1335)  . sodium chloride    . ceFEPime (MAXIPIME) IV    . diltiazem (CARDIZEM) infusion Stopped (02/08/20 0600)  . metronidazole    . vancomycin       LOS: 16 days    Time spent:61  CRITICAL CARE Performed by: Athena Masse   Total critical care time: 61 minutes  Critical care time was exclusive of separately billable procedures and treating  other patients.  Critical care was necessary to treat or prevent imminent or life-threatening deterioration.  Critical care was time spent personally by me on the following activities: development of treatment plan with patient and/or surrogate as well as nursing, discussions with consultants, evaluation of patient's response to treatment, examination of patient, obtaining history from patient or surrogate, ordering and performing treatments and interventions, ordering and review of laboratory studies, ordering and review of radiographic studies, pulse oximetry and re-evaluation of patient's condition.    Athena Masse, MD [facility] Pager [phone]  If 7PM-7AM, please contact night-coverage [email or web address] Password [facility] 02/08/2020, 6:23 AM

## 2020-02-07 NOTE — Progress Notes (Signed)
Inpatient Rehabilitation Admissions Coordinator  Inpatient rehabilitation consult received for possible Inpt rehab /CIR admit at [facility]. Noted that both PT and OT have changed their recommendation to SNF. Felt patient unable to tolerate the intensity required of an inpt rehab admit. I concur. Please call me with any questions.  Ottie Glazier, RN, MSN Rehab Admissions Coordinator [phone] 02/07/2020 8:20 AM

## 2020-02-08 ENCOUNTER — Inpatient Hospital Stay: Payer: Self-pay

## 2020-02-08 ENCOUNTER — Encounter: Payer: Self-pay | Admitting: Family Medicine

## 2020-02-08 ENCOUNTER — Inpatient Hospital Stay (HOSPITAL_COMMUNITY)
Admit: 2020-02-08 | Discharge: 2020-02-08 | Disposition: A | Discharge status: Home / Self Care | Payer: Self-pay | Attending: Nurse Practitioner | Admitting: Nurse Practitioner

## 2020-02-08 DIAGNOSIS — R609 Edema, unspecified: Secondary | ICD-10-CM

## 2020-02-08 DIAGNOSIS — J810 Acute pulmonary edema: Secondary | ICD-10-CM

## 2020-02-08 DIAGNOSIS — R5081 Fever presenting with conditions classified elsewhere: Secondary | ICD-10-CM

## 2020-02-08 DIAGNOSIS — I340 Nonrheumatic mitral (valve) insufficiency: Secondary | ICD-10-CM

## 2020-02-08 DIAGNOSIS — I4891 Unspecified atrial fibrillation: Secondary | ICD-10-CM

## 2020-02-08 DIAGNOSIS — I361 Nonrheumatic tricuspid (valve) insufficiency: Secondary | ICD-10-CM

## 2020-02-08 LAB — COMPREHENSIVE METABOLIC PANEL
ALT: 82 U/L — ABNORMAL HIGH (ref 0–44)
AST: 136 U/L — ABNORMAL HIGH (ref 15–41)
Albumin: 2.8 g/dL — ABNORMAL LOW (ref 3.5–5.0)
Alkaline Phosphatase: 52 U/L (ref 38–126)
Anion gap: 10 (ref 5–15)
BUN: 23 mg/dL — ABNORMAL HIGH (ref 6–20)
CO2: 25 mmol/L (ref 22–32)
Calcium: 9 mg/dL (ref 8.9–10.3)
Chloride: 108 mmol/L (ref 98–111)
Creatinine, Ser: 0.6 mg/dL — ABNORMAL LOW (ref 0.61–1.24)
GFR calc Af Amer: >60 mL/min (ref >60)
GFR calc non Af Amer: >60 mL/min (ref >60)
Glucose, Bld: 101 mg/dL — ABNORMAL HIGH (ref 70–99)
Potassium: 3.4 mmol/L — ABNORMAL LOW (ref 3.5–5.1)
Sodium: 143 mmol/L (ref 135–145)
Total Bilirubin: 9.6 mg/dL — ABNORMAL HIGH (ref 0.3–1.2)
Total Protein: 6.9 g/dL (ref 6.5–8.1)

## 2020-02-08 LAB — PROTIME-INR
INR: 1.5 — ABNORMAL HIGH (ref 0.8–1.2)
Prothrombin Time: 17.2 seconds — ABNORMAL HIGH (ref 11.4–15.2)

## 2020-02-08 LAB — URINALYSIS, ROUTINE W REFLEX MICROSCOPIC
Bacteria, UA: NONE SEEN
Bilirubin Urine: NEGATIVE
Glucose, UA: NEGATIVE mg/dL
Ketones, ur: NEGATIVE mg/dL
Leukocytes,Ua: NEGATIVE
Nitrite: NEGATIVE
Protein, ur: 30 mg/dL — AB
Specific Gravity, Urine: 1.026 (ref 1.005–1.030)
Squamous Epithelial / HPF: NONE SEEN (ref 0–5)
pH: 5 (ref 5.0–8.0)

## 2020-02-08 LAB — ECHOCARDIOGRAM COMPLETE
AR max vel: 2.81 cm2
AV Area VTI: 3.09 cm2
AV Area mean vel: 2.69 cm2
AV Mean grad: 11 mmHg
AV Peak grad: 19 mmHg
Ao pk vel: 2.18 m/s
Area-P 1/2: 3.23 cm2
Height: 67.008 in
S' Lateral: 3.13 cm
Weight: 4140.8 oz

## 2020-02-08 LAB — CBC WITH DIFFERENTIAL/PLATELET
Abs Immature Granulocytes: 0.18 10*3/uL — ABNORMAL HIGH (ref 0.00–0.07)
Basophils Absolute: 0.2 10*3/uL — ABNORMAL HIGH (ref 0.0–0.1)
Basophils Relative: 1 %
Eosinophils Absolute: 0.5 10*3/uL (ref 0.0–0.5)
Eosinophils Relative: 4 %
HCT: 32.7 % — ABNORMAL LOW (ref 39.0–52.0)
Hemoglobin: 10.6 g/dL — ABNORMAL LOW (ref 13.0–17.0)
Immature Granulocytes: 1 %
Lymphocytes Relative: 17 %
Lymphs Abs: 2.3 10*3/uL (ref 0.7–4.0)
MCH: 34 pg (ref 26.0–34.0)
MCHC: 32.4 g/dL (ref 30.0–36.0)
MCV: 104.8 fL — ABNORMAL HIGH (ref 80.0–100.0)
Monocytes Absolute: 1.1 10*3/uL — ABNORMAL HIGH (ref 0.1–1.0)
Monocytes Relative: 8 %
Neutro Abs: 9 10*3/uL — ABNORMAL HIGH (ref 1.7–7.7)
Neutrophils Relative %: 69 %
Platelets: 167 10*3/uL (ref 150–400)
RBC: 3.12 MIL/uL — ABNORMAL LOW (ref 4.22–5.81)
RDW: 27.3 % — ABNORMAL HIGH (ref 11.5–15.5)
Smear Review: NORMAL
WBC: 13.2 10*3/uL — ABNORMAL HIGH (ref 4.0–10.5)
nRBC: 0.4 % — ABNORMAL HIGH (ref 0.0–0.2)

## 2020-02-08 LAB — GLUCOSE, CAPILLARY
Glucose-Capillary: 96 mg/dL (ref 70–99)
Glucose-Capillary: 90 mg/dL (ref 70–99)
Glucose-Capillary: 99 mg/dL (ref 70–99)
Glucose-Capillary: 94 mg/dL (ref 70–99)
Glucose-Capillary: 88 mg/dL (ref 70–99)

## 2020-02-08 LAB — MAGNESIUM: Magnesium: 1.6 mg/dL — ABNORMAL LOW (ref 1.7–2.4)

## 2020-02-08 LAB — APTT: aPTT: 33 seconds (ref 24–36)

## 2020-02-08 LAB — LACTIC ACID, PLASMA: Lactic Acid, Venous: 1.2 mmol/L (ref 0.5–1.9)

## 2020-02-08 LAB — PHOSPHORUS: Phosphorus: 4.3 mg/dL (ref 2.5–4.6)

## 2020-02-08 MED ORDER — FUROSEMIDE 10 MG/ML IJ SOLN
40.0000 mg | Freq: Once | INTRAMUSCULAR | Status: AC
  Administered 2020-02-08: 40 mg via INTRAVENOUS
  Filled 2020-02-08: qty 4

## 2020-02-08 MED ORDER — VANCOMYCIN HCL IN DEXTROSE 1-5 GM/200ML-% IV SOLN
1000.0000 mg | Freq: Three times a day (TID) | INTRAVENOUS | Status: DC
  Administered 2020-02-08 – 2020-02-10 (×6): 1000 mg via INTRAVENOUS
  Filled 2020-02-08 (×8): qty 200

## 2020-02-08 MED ORDER — SODIUM CHLORIDE 0.9 % IV SOLN: 2.0000 g | Freq: Three times a day (TID) | INTRAVENOUS | Status: DC

## 2020-02-08 MED ORDER — MAGNESIUM SULFATE 2 GM/50ML IV SOLN
2.0000 g | Freq: Once | INTRAVENOUS | Status: AC
  Administered 2020-02-08: 2 g via INTRAVENOUS
  Filled 2020-02-08: qty 50

## 2020-02-08 MED ORDER — FUROSEMIDE 10 MG/ML IJ SOLN: 40.0000 mg | Freq: Once | INTRAMUSCULAR | Status: DC

## 2020-02-08 MED ORDER — VANCOMYCIN HCL IN DEXTROSE 1-5 GM/200ML-% IV SOLN: 1000.0000 mg | Freq: Three times a day (TID) | INTRAVENOUS | Status: DC

## 2020-02-08 MED ORDER — FUROSEMIDE 10 MG/ML IJ SOLN
40.0000 mg | Freq: Two times a day (BID) | INTRAMUSCULAR | Status: DC
  Administered 2020-02-08 – 2020-02-10 (×4): 40 mg via INTRAVENOUS
  Filled 2020-02-08 (×4): qty 4

## 2020-02-08 MED ORDER — SODIUM CHLORIDE 0.9 % IV SOLN
2.0000 g | Freq: Once | INTRAVENOUS | Status: DC
  Filled 2020-02-08: qty 2

## 2020-02-08 MED ORDER — SODIUM CHLORIDE 0.9 % IV SOLN
2.0000 g | Freq: Three times a day (TID) | INTRAVENOUS | Status: DC
  Administered 2020-02-08 – 2020-02-10 (×7): 2 g via INTRAVENOUS
  Filled 2020-02-08 (×9): qty 2

## 2020-02-08 MED ORDER — VANCOMYCIN HCL IN DEXTROSE 1-5 GM/200ML-% IV SOLN
1000.0000 mg | Freq: Once | INTRAVENOUS | Status: DC
  Filled 2020-02-08: qty 200

## 2020-02-08 MED ORDER — FUROSEMIDE 10 MG/ML IJ SOLN: 20.0000 mg | Freq: Once | INTRAMUSCULAR | Status: AC

## 2020-02-08 MED ORDER — METRONIDAZOLE IN NACL 5-0.79 MG/ML-% IV SOLN
500.0000 mg | Freq: Three times a day (TID) | INTRAVENOUS | Status: DC
  Administered 2020-02-08 – 2020-02-09 (×4): 500 mg via INTRAVENOUS
  Filled 2020-02-08 (×6): qty 100

## 2020-02-08 MED ORDER — FUROSEMIDE 10 MG/ML IJ SOLN
INTRAMUSCULAR | Status: AC
  Administered 2020-02-08: 20 mg via INTRAVENOUS
  Filled 2020-02-08: qty 4

## 2020-02-08 MED ORDER — METOPROLOL TARTRATE 25 MG PO TABS
37.7000 mg | ORAL_TABLET | Freq: Two times a day (BID) | ORAL | Status: DC
  Filled 2020-02-08: qty 1

## 2020-02-08 MED ORDER — VANCOMYCIN HCL 1500 MG/300ML IV SOLN
1500.0000 mg | Freq: Once | INTRAVENOUS | Status: AC
  Administered 2020-02-08: 1500 mg via INTRAVENOUS
  Filled 2020-02-08: qty 300

## 2020-02-08 MED ORDER — LORAZEPAM 2 MG/ML IJ SOLN
1.0000 mg | Freq: Once | INTRAMUSCULAR | Status: AC
  Administered 2020-02-08: 1 mg via INTRAVENOUS
  Filled 2020-02-08: qty 1

## 2020-02-08 MED ORDER — METOPROLOL TARTRATE 25 MG PO TABS
37.5000 mg | ORAL_TABLET | Freq: Two times a day (BID) | ORAL | Status: DC
  Administered 2020-02-08 – 2020-02-14 (×12): 37.5 mg via ORAL
  Filled 2020-02-08 (×12): qty 2

## 2020-02-08 MED ORDER — POTASSIUM CHLORIDE CRYS ER 20 MEQ PO TBCR
20.0000 meq | EXTENDED_RELEASE_TABLET | Freq: Two times a day (BID) | ORAL | Status: AC
  Administered 2020-02-08 (×2): 20 meq via ORAL
  Filled 2020-02-08 (×2): qty 1

## 2020-02-08 NOTE — Progress Notes (Signed)
Tele called and reported pt back in normal sinus rhythm.

## 2020-02-08 NOTE — Progress Notes (Signed)
Pharmacy Antibiotic Note  [name] is a 49 y.o. male admitted on 01/23/2020 with sepsis.  Pharmacy has been consulted for Cefepime and Vancomycin dosing.  Plan: Cefepime 2gm IV q8hrs Vancomycin 1gm IV q8hrs (per nomogram dosing)  Height: 5' 7.01" (170.2 cm) Weight: 117.4 kg (258 lb 12.8 oz) IBW/kg (Calculated) : 66.12  Temp (24hrs), Avg:99.9 F (37.7 C), Min:98.3 F (36.8 C), Max:101 F (38.3 C)  Recent Labs  Lab 02/02/20 0500 02/03/20 0014 02/04/20 0430 02/05/20 0440 02/06/20 0646 02/07/20 0739 02/08/20 0545  WBC   < >  --  10.0 11.8* 15.0* 13.3* 13.2*  CREATININE  --  1.03  --  0.91 0.86 0.68 0.60*  LATICACIDVEN  --   --   --   --   --   --  1.2   < > = values in this interval not displayed.    Estimated Creatinine Clearance: 136.8 mL/min (A) (by C-G formula based on SCr of 0.6 mg/dL (L)).    No Known Allergies  Antimicrobials this admission:   >>    >>   Dose adjustments this admission:   Microbiology results:  BCx:   UCx:    Sputum:    MRSA PCR:   Thank you for allowing pharmacy to be a part of this patient's care.  Valrie Hart A 02/08/2020 6:38 AM

## 2020-02-08 NOTE — Progress Notes (Signed)
*  PRELIMINARY RESULTS* Echocardiogram 2D Echocardiogram has been performed.  Cristela Blue 02/08/2020, 1:25 PM

## 2020-02-08 NOTE — Progress Notes (Addendum)
PROGRESS NOTE    [name]  ZOX:[id number] DOB: 04/26/1971 DOA: 01/23/2020 PCP: Remi Haggard, FNP  Outpatient Specialists:     Brief Narrative:  [name] an 49 y.o.malewith a known history of polysubstance abuse including alcohol abuse who presented to the emergency room with acute onset of increased generalized weakness and amechanicalfall.Alcohol level 296 on admission. Patient was transferred to ICU on 6 September due to increased work of breathing and accessory muscle use. He was withdrawing from alcohol required intubation and mechanical ventilation.  Events: 9/6 transferred to ICU for increased WOB, using accessory muscles to breathe with liver failure 9/7 remains on vent, liver failure, PICC line placed, brother updated 9/8 remains encephalopathic, liver failure, resp failure 9/10 remains on ventilator. Long discussion and update with brother. Sedatives switch to Precedex and fentanyl, continue CIWA scale 9/11still with some agitation when sedation is lightened. Intolerance to SBT due to agitation. 9/12 extubated 9/13 hypoxia improving 9/14 more alert awake, DT's improving, transferred out of ICU 9/15 more alert, DTs continue to improve 9/16 more alert, DTs improving still. Overnight a fib with rvr, cxr showing pulmonary congestion. Treated with IV lasix and diltiazem drip, dilt trip weaned off after a few hours.    Assessment & Plan:   Principal Problem:   Sepsis ([place]) Active Problems:   Acute blood loss anemia   GIB (gastrointestinal bleeding)   Transaminitis   Hyponatremia   Hypokalemia   Falls   Hip pain   Rhabdomyolysis   Hypovolemia   Hypotension   Alcohol abuse   Goals of care, counseling/discussion   Palliative care by specialist  # new-onset atrial fibrillation w/ RVR - occurred overnight, in setting of possible infection. CXR with pulmonary congestion. Treated w/ iv lasix and dilt drip, dilt now off and has resumed  home metoprolol, rate controlled appears to be in sinus. With 2+ pittine LE edema still, appears to be net negative this hospitalization (though not clear if outs have been accurately recorded) - will give another 40 mg IV lasix, hold po - replete lytes - cont metoprolo - cardiology consulted  # Family request of transfer to [facility] - called [facility] transfer center and told due to covid surge they are not currently accepting outside transfers  # Fever - in setting of a fib w/ rvr. Now abated. ua unremarkable. CXR without signs infection. Blood cultures drawn. Started on broad-spectrum abx. May be sequelae of DTs - cont vanc/zosyn/flagyl - check abd u/s for fluid, result negative, but incidentally did note pleural effusion - f/u blood cultures  # Pleural effusion - incidentally noted on abd u/s - will check CT to further eval  # Hypoxic hypercapnic respiratory failure due to multiorgan liver failure and aspirationi pneumonia. Now extubated and breathing comfortably on 2 L [place]. S/p treatment for CAP - nursing to see about discontinuing O2  # Liver failure - stable - abd fluid assessment as above - will f/u with GI tomorrow, unclear if they signed off or if their involvement was paused when patient transferred to ICU.  # Electrolytes - k 3.4, will order 80 oral - mg 1.6, will order 2 g IV  # Acute diastolic heart failure - appears fluid overloaded today with sig LE edema - lasix and cardiology consult as above  # Macrocytic anemia - H 10s, stable. B12/folate wnl  # Delirium tremens  - improving, s/p precedex - will give ativan 1 mg x1 - cont thiamine, lactulose, rifaximin. Cont bid  lactulose, 3 BMs last 24 hours per nursing  # Right gluteal hematoma - seen by ortho this hospitalization, advised no intervention. With significant dependent bruising right leg. - PVLs ordered by night team to eval for DVT, negative for DVT      DVT prophylaxis: heparin Code Status:  full Family Communication: brother updated @ bedside Disposition Plan: needs SNF, has no insurance, care mgmt to look into charity outpt pt/ot. Has been declined by inpatient rehab   Consultants:   GI and ortho signed off   Procedures:    Antimicrobials: s/p cefepime. Vanc/zosyn/flagyl 9/17 >   Subjective: Mildly tremulous, persistent not worsening leg pain, denies shortness of breath or chest pain.   Objective: Vitals:   02/08/20 0600 02/08/20 0700 02/08/20 0753 02/08/20 0757  BP: 137/75   136/77  Pulse: 79  76 77  Resp: (!) 44 20 17   Temp:   98 F (36.7 C)   TempSrc:   Oral   SpO2:   100%   Weight:      Height:        Intake/Output Summary (Last 24 hours) at 02/08/2020 0829 Last data filed at 02/08/2020 0500 Gross per 24 hour  Intake 933.89 ml  Output 151 ml  Net 782.89 ml   Filed Weights   02/02/20 0500 02/06/20 0541 02/08/20 0407  Weight: 120.2 kg 117.3 kg 117.4 kg    Examination:  General exam: mild tremor, NAD Respiratory system: poor effort, scattered rhonchi Cardiovascular system: S1 & S2 heard, RRR. Mild systolic murmur Gastrointestinal system: abd moderately distended, non tender Central nervous system: Alert , oriented to self Extremities: Symmetric 5 x 5 power. Skin: echymoses dependent side of b/l LEs right worse than left Psychiatry: not agitated    Data Reviewed:   CBC: Recent Labs  Lab 02/04/20 0430 02/04/20 0430 02/04/20 0830 02/05/20 0440 02/06/20 0646 02/07/20 0739 02/08/20 0545  WBC 10.0  --   --  11.8* 15.0* 13.3* 13.2*  NEUTROABS 7.6  --   --  8.6* 10.9* 9.3* 9.0*  HGB 9.5*   < > 9.5* 9.8* 9.5* 9.6* 10.6*  HCT 29.0*   < > 29.2* 31.8* 30.2* 30.6* 32.7*  MCV 102.1*  --   --  107.1* 107.9* 108.5* 104.8*  PLT 171  --   --  167 191 177 167   < > = values in this interval not displayed.   Basic Metabolic Panel: Recent Labs  Lab 02/03/20 0014 02/03/20 0014 02/04/20 0430 02/05/20 0440 02/06/20 0646 02/07/20 0739  02/08/20 0545  NA 145  --   --  148* 145 143 143  K 4.6  --   --  4.5 3.7 3.6 3.4*  CL 112*  --   --  113* 110 108 108  CO2 28  --   --  $R'26 25 24 25  'JR$ GLUCOSE 114*  --   --  105* 108* 92 101*  BUN 42*  --   --  46* 31* 22* 23*  CREATININE 1.03  --   --  0.91 0.86 0.68 0.60*  CALCIUM 8.1*  --   --  8.5* 8.9 8.9 9.0  MG 1.8   < > 1.9 2.0 1.8 1.6* 1.6*  PHOS 3.0   < > 3.2 3.5 3.5  3.5 3.8  3.9 4.3   < > = values in this interval not displayed.   GFR: Estimated Creatinine Clearance: 136.8 mL/min (A) (by C-G formula based on SCr of 0.6 mg/dL (L)). Liver Function  Tests: Recent Labs  Lab 02/02/20 0500 02/02/20 0500 02/05/20 0440 02/06/20 0500 02/06/20 0646 02/07/20 0739 02/08/20 0545  AST 122*  --   --  145*  --  154* 136*  ALT 49*  --   --  69*  --  83* 82*  ALKPHOS 69  --   --  53  --  46 52  BILITOT 8.8*  --   --  8.5*  --  9.1* 9.6*  PROT 6.2*  --   --  6.6  --  6.5 6.9  ALBUMIN 2.3*   < > 2.7* 2.7* 2.7* 2.7*  2.7* 2.8*   < > = values in this interval not displayed.   No results for input(s): LIPASE, AMYLASE in the last 168 hours. Recent Labs  Lab 02/02/20 1800  AMMONIA 37*   Coagulation Profile: Recent Labs  Lab 02/01/20 1028 02/08/20 0545  INR 1.6* 1.5*   Cardiac Enzymes: Recent Labs  Lab 02/02/20 1357  CKTOTAL 210   BNP (last 3 results) No results for input(s): PROBNP in the last 8760 hours. HbA1C: No results for input(s): HGBA1C in the last 72 hours. CBG: Recent Labs  Lab 02/07/20 0742 02/07/20 1138 02/07/20 1620 02/07/20 2128 02/08/20 0410  GLUCAP 86 91 99 97 96   Lipid Profile: No results for input(s): CHOL, HDL, LDLCALC, TRIG, CHOLHDL, LDLDIRECT in the last 72 hours. Thyroid Function Tests: No results for input(s): TSH, T4TOTAL, FREET4, T3FREE, THYROIDAB in the last 72 hours. Anemia Panel: Recent Labs    02/07/20 0739  VITAMINB12 1,060*  FOLATE 10.3   Urine analysis:    Component Value Date/Time   COLORURINE AMBER (A) 02/08/2020  0455   APPEARANCEUR HAZY (A) 02/08/2020 0455   LABSPEC 1.026 02/08/2020 0455   PHURINE 5.0 02/08/2020 0455   GLUCOSEU NEGATIVE 02/08/2020 0455   HGBUR SMALL (A) 02/08/2020 0455   BILIRUBINUR NEGATIVE 02/08/2020 0455   KETONESUR NEGATIVE 02/08/2020 0455   PROTEINUR 30 (A) 02/08/2020 0455   NITRITE NEGATIVE 02/08/2020 0455   LEUKOCYTESUR NEGATIVE 02/08/2020 0455   Sepsis Labs: $RemoveBefo'@[email or web address]'beKlOjRuDUf$ (procalcitonin:4,lacticidven:4)  ) No results found for this or any previous visit (from the past 240 hour(s)).       Radiology Studies: US Venous Img Lower Bilateral (DVT)  Result Date: 02/06/2020 CLINICAL DATA:  Bilateral lower extremity pain and edema. Recent fall. Evaluate for DVT. EXAM: BILATERAL LOWER EXTREMITY VENOUS DOPPLER ULTRASOUND TECHNIQUE: Gray-scale sonography with graded compression, as well as color Doppler and duplex ultrasound were performed to evaluate the lower extremity deep venous systems from the level of the common femoral vein and including the common femoral, femoral, profunda femoral, popliteal and calf veins including the posterior tibial, peroneal and gastrocnemius veins when visible. The superficial great saphenous vein was also interrogated. Spectral Doppler was utilized to evaluate flow at rest and with distal augmentation maneuvers in the common femoral, femoral and popliteal veins. COMPARISON:  None. FINDINGS: RIGHT LOWER EXTREMITY Common Femoral Vein: No evidence of thrombus. Normal compressibility, respiratory phasicity and response to augmentation. Saphenofemoral Junction: No evidence of thrombus. Normal compressibility and flow on color Doppler imaging. Profunda Femoral Vein: No evidence of thrombus. Normal compressibility and flow on color Doppler imaging. Femoral Vein: No evidence of thrombus. Normal compressibility, respiratory phasicity and response to augmentation. Popliteal Vein: No evidence of thrombus. Normal compressibility, respiratory phasicity and response to  augmentation. Calf Veins: No evidence of thrombus. Normal compressibility and flow on color Doppler imaging. Superficial Great Saphenous Vein: No evidence of thrombus. Normal compressibility. Venous  Reflux:  None. Other Findings:  None. LEFT LOWER EXTREMITY Common Femoral Vein: No evidence of thrombus. Normal compressibility, respiratory phasicity and response to augmentation. Saphenofemoral Junction: No evidence of thrombus. Normal compressibility and flow on color Doppler imaging. Profunda Femoral Vein: No evidence of thrombus. Normal compressibility and flow on color Doppler imaging. Femoral Vein: No evidence of thrombus. Normal compressibility, respiratory phasicity and response to augmentation. Popliteal Vein: No evidence of thrombus. Normal compressibility, respiratory phasicity and response to augmentation. Calf Veins: No evidence of thrombus. Normal compressibility and flow on color Doppler imaging. Superficial Great Saphenous Vein: No evidence of thrombus. Normal compressibility. Venous Reflux:  None. Other Findings: Note is made of an approximately 22.1 x 5.7 x 3.0 cm minimally complex fluid collection extending from the left popliteal fossa. IMPRESSION: 1. No evidence of DVT within either lower extremity. 2. Note made of an approximately 22.1 cm minimally complex fluid collection extending from the left popliteal fossa, nonspecific with differential considerations including a leaking Baker's cyst though given provided history of recent fall conceivably a hematoma could have a similar appearance. Clinical correlation is advised. Electronically Signed   By: Sandi Mariscal M.D.   On: 02/06/2020 11:50   DG Chest Port 1 View  Result Date: 02/08/2020 CLINICAL DATA:  Fever EXAM: PORTABLE CHEST 1 VIEW COMPARISON:  02/01/2020 FINDINGS: Cardiomegaly, vascular congestion. Bilateral hilar prominence, favor vascular. Diffuse interstitial prominence throughout the lungs, likely edema. No effusions. No acute bony  abnormality. IMPRESSION: Cardiomegaly with vascular congestion and interstitial prominence, likely mild edema. Bilateral hilar prominence, favor vascular. Electronically Signed   By: Rolm Baptise M.D.   On: 02/08/2020 05:23        Scheduled Meds: . chlorhexidine gluconate (MEDLINE KIT)  15 mL Mouth Rinse BID  . Chlorhexidine Gluconate Cloth  6 each Topical Daily  . feeding supplement (ENSURE ENLIVE)  237 mL Oral TID BM  . folic acid  1 mg Oral Daily  . furosemide  40 mg Intravenous Once  . heparin  5,000 Units Subcutaneous Q8H  . lactulose  30 g Oral BID  . metoprolol tartrate  12.5 mg Oral BID  . multivitamin with minerals  1 tablet Oral Daily  . pantoprazole (PROTONIX) IV  40 mg Intravenous Q12H  . potassium chloride  20 mEq Oral BID  . rifaximin  550 mg Oral BID  . sodium chloride flush  10-40 mL Intracatheter Q12H  . sodium chloride flush  10-40 mL Intracatheter Q12H  . thiamine  100 mg Oral Daily  . traMADol  50 mg Oral Q6H   Continuous Infusions: . sodium chloride Stopped (02/07/20 1335)  . sodium chloride    . ceFEPime (MAXIPIME) IV    . magnesium sulfate bolus IVPB    . metronidazole    . vancomycin       LOS: 16 days    Time spent: [place], MD [facility] Pager [phone]  If 7PM-7AM, please contact night-coverage [email or web address] Password [facility] 02/08/2020, 8:29 AM

## 2020-02-08 NOTE — Progress Notes (Signed)
Lasix 20mg  given IVP per MD order.

## 2020-02-08 NOTE — Progress Notes (Signed)
Pharmacy Antibiotic Note  [name] is a 49 y.o. male admitted on 01/23/2020 with sepsis of unknown origin. Patient has PMH of alcoholic hepatitis, HLD, and HTN. Earlier in this admission, patient completed a 7 day course of cefepime and 5 day course of ceftriaxone for sepsis 9/6-9/13 while in the ICU and was transferred out of the ICU on 9/14. Patient WBC improved and began to increase again on 9/14 and also had intermittent low grade fevers. On 9/16 patient became septic again and had rapid Afib but converted back to NSR after receiving diltiazem infusion. CXR without signs of infection. Pharmacy has been consulted for Cefepime and Vancomycin dosing. Patient was started on Vancomycin IV 1g q8h with no loading dose.    Scr <1, stable WBC 13.3>13.2 Last fever 9/17 @0335  100.59F  Plan:  --Continue Cefepime 2gm IV q8hrs --Patient received first dose of Vancomycin maintenance dose at 0844, will give additional Vancomycin 1500mg  IV x1 to load the patient --Continue Vancomycin 1gm IV q8hrs (per nomogram dosing) --Monitor renal function and adjust dose as clinically indicated --If vanc is continued, will obtain vanc level prior to 4th or 5th dose   Height: 5' 7.01" (170.2 cm) Weight: 117.4 kg (258 lb 12.8 oz) IBW/kg (Calculated) : 66.12  Temp (24hrs), Avg:99.7 F (37.6 C), Min:98 F (36.7 C), Max:101 F (38.3 C)  Recent Labs  Lab 02/02/20 0500 02/03/20 0014 02/04/20 0430 02/05/20 0440 02/06/20 0646 02/07/20 0739 02/08/20 0545  WBC   < >  --  10.0 11.8* 15.0* 13.3* 13.2*  CREATININE  --  1.03  --  0.91 0.86 0.68 0.60*  LATICACIDVEN  --   --   --   --   --   --  1.2   < > = values in this interval not displayed.    Estimated Creatinine Clearance: 136.8 mL/min (A) (by C-G formula based on SCr of 0.6 mg/dL (L)).    No Known Allergies  Antimicrobials this admission: Cefepime >> 9/6-9/13, 9/17>> CTX >> 9/1-9/6 Metronidazole >> 9/17 Vancomycin >> 9/17   Microbiology  results: 9/2 BCx: negative   9/17 Bcx: pending 9/6 MRSA PCR: negative  Thank you for allowing pharmacy to be a part of this patient's care.  10/17, PharmD Pharmacy Resident  02/08/2020 1:59 PM

## 2020-02-08 NOTE — Progress Notes (Signed)
Tylenol 650mg  given po for temp 101.

## 2020-02-08 NOTE — Progress Notes (Signed)
Temp 100.7.  Ice packs applied under each arm.  Resp 44, HR reamins 130's- 150.  Dr, Para March notified thru secure chat.

## 2020-02-08 NOTE — Progress Notes (Signed)
Physical Therapy Treatment Patient Details Name: [name] MRN: [id number] DOB: [birth year] Today's Date: 02/08/2020    History of Present Illness 49 y.o. male with a known history of polysubstance abuse including alcohol abuse who presented to the emergency room with acute onset of increased generalized weakness and a mechanical fall.  Alcohol level 296 on admission.  Patient was transferred to ICU on 6 September due to increased work of breathing and accessory muscle use.  He was withdrawing from alcohol required intubation and mechanical ventilation. Extubated 9/12, Precedex drip stopped 9/14.    PT Comments    Pt presents in bed in fowler's position awake with his brother, Nash Dimmer, bedside. In the hallway, Nash Dimmer notes that he is doing worse today and having a more difficult time communicating. Once back in the room, pt is agreeable for some physical therapy. Pt is impulsive and starts trying to get EOB before it is safe to. Patient is mod to max +2 for supine <> sit with getting R LE off/on bed and assisting trunk up/down from sitting EOB. At EOB, pt able to sit with no UE support, but only for a short time before he leans posteriorly and needs UE support. He reports no dizziness. When sitting, pt uses the urinal with mod A +1 for support. Pt attempts to push up from bed before it is safe to, as the floor is wet and pt is not strong enough to stand up. Pt is immediately put back into bed and performs strengthening exercises and bedrolling to assist the process of changing the sheets. Pt is mod to max +2 for bed rolling, but is able to bring UE to bedrail to help support himself. VCs needed for hand placement on bed rails and bending the appropriate knee.  Monitored O2 sat levels throughout session and he stayed between 96-100 with 2 L. HR between 80 and 90 bpm throughout session. Pt reports he has been doing exercises in bed throughout the day. Pt would benefit from skilled PT to address  impairments and functional limitations in order to maximize function.   Follow Up Recommendations  SNF     Equipment Recommendations  Rolling walker with 5" wheels;3in1 (PT) (per progress/next venue of care)    Recommendations for Other Services       Precautions / Restrictions Precautions Precautions: Fall Restrictions Weight Bearing Restrictions: No    Mobility  Bed Mobility Overal bed mobility: Needs Assistance Bed Mobility: Rolling;Supine to Sit;Sit to Supine Rolling: Mod assist;Max assist;+2 for physical assistance   Supine to sit: Max assist;+2 for physical assistance Sit to supine: Max assist;+2 for physical assistance   General bed mobility comments: Patient able to perform rolling side to side for bed level toileting and pericare requiring min to mod A to perform, VC utilized for hand placement on bed rails to support in sideyling; Pt requires MAX A to advance BLE over EOB during sup<>sit as well as assit to advance hips forward to bring BLE onto floor this date.  Transfers                 General transfer comment: Did not attempt due to pt's safety and unsteadiness  Ambulation/Gait             General Gait Details: Unable/unsafe to attempt   Stairs             Wheelchair Mobility    Modified Rankin (Stroke Patients Only)       Balance Overall balance assessment:  Needs assistance Sitting-balance support: Single extremity supported;Bilateral upper extremity supported;Feet supported Sitting balance-Leahy Scale: Poor Sitting balance - Comments: Pt seated balance variable t/o session. He is intermittently able to sit unsupported with BUE on bed. Pt gets tired and has posterior lean, verbal and tactilues cues used to keep patient upright. Postural control: Posterior lean     Standing balance comment: Unable to stand to assess due to patient's unsteadiness                            Cognition Arousal/Alertness:  Awake/alert;Lethargic Behavior During Therapy: Impulsive;Flat affect Overall Cognitive Status: Within Functional Limits for tasks assessed                                 General Comments: Pt is alert and generally following 1 step VCs with increased time, however hard to understand him when he is talking due to hoarse voice and low volume. His brother said that it is worse today than yesterday.      Exercises General Exercises - Upper Extremity Elbow Flexion: Strengthening;AROM;10 reps;Supine (added manual resistance for last 5 reps) Elbow Extension: AROM;Strengthening;10 reps;Supine (added manual resistance for last 5 reps) General Exercises - Lower Extremity Ankle Circles/Pumps: AROM;Right;Left;10 reps;Supine Quad Sets: Left;Right;AROM;5 reps;Supine Heel Slides: Left;10 reps;Supine;AROM Hip ABduction/ADduction: AROM;Left;10 reps;Supine Other Exercises Other Exercises: bedmobility training with VC for hand placement to improve technique and stability in sidelying for pericare/bed level toileting task and changing sheets Other Exercises: Grip strength x 10 reps BUE Other Exercises: Pt instructed briefly in BUE and BLE ther ex to perform at bed level to maintain and improve functional strength for future ADL mobility and ADL tasks    General Comments        Pertinent Vitals/Pain Pain Assessment: 0-10 Pain Score: 7  Pain Location: R LE Pain Descriptors / Indicators: Shooting;Sharp;Pins and needles;Tingling Pain Intervention(s): Monitored during session;Limited activity within patient's tolerance    Home Living                      Prior Function            PT Goals (current goals can now be found in the care plan section) Acute Rehab PT Goals Patient Stated Goal: To get stronger and be independent again PT Goal Formulation: With patient Time For Goal Achievement: 02/19/20 Potential to Achieve Goals: Fair Progress towards PT goals: Progressing toward  goals    Frequency    Min 2X/week      PT Plan Current plan remains appropriate    Co-evaluation              AM-PAC PT "6 Clicks" Mobility   Outcome Measure  Help needed turning from your back to your side while in a flat bed without using bedrails?: A Lot Help needed moving from lying on your back to sitting on the side of a flat bed without using bedrails?: A Lot Help needed moving to and from a bed to a chair (including a wheelchair)?: Total Help needed standing up from a chair using your arms (e.g., wheelchair or bedside chair)?: Total Help needed to walk in hospital room?: Total Help needed climbing 3-5 steps with a railing? : Total 6 Click Score: 8    End of Session Equipment Utilized During Treatment: Oxygen Activity Tolerance: Patient limited by pain;Patient limited by fatigue Patient left: with  nursing/sitter in room;in bed Nurse Communication: Mobility status PT Visit Diagnosis: Muscle weakness (generalized) (M62.81);Difficulty in walking, not elsewhere classified (R26.2);Pain;Unsteadiness on feet (R26.81) Pain - Right/Left: Right Pain - part of body: Leg     Time: 0920-1009 PT Time Calculation (min) (ACUTE ONLY): 49 min  Charges:                          Katherine Basset, SPT Baker Pierini 02/08/2020, 12:39 PM

## 2020-02-08 NOTE — Progress Notes (Signed)
Sepsis orders initiated.  RRT nurse, Beth, came over to assess patient.  Urine obtained.  EKG being done presently.

## 2020-02-08 NOTE — Progress Notes (Signed)
OT Cancellation Note  Patient Details Name: [name] MRN: [id number] DOB: 24-Feb-1971   Cancelled Treatment:    Reason Eval/Treat Not Completed: Patient at procedure or test/ unavailable. OT continues to follow pt for tx. Upon arrival to unit, spoke with pt primary RN who indicated pt is currently off the floor for testing. Will hold OT tx at this time and re-attempt at a later time/date as available and pt medically appropriate for OT tx session.   Rockney Ghee, M.S., OTR/L Ascom: [phone] 02/08/20, 1:35 PM

## 2020-02-08 NOTE — Progress Notes (Signed)
Ch visited with Pt and Pt's brother Nash Dimmer after RR page. MDs let Ch know that he was okay. Nash Dimmer was worried about a decline in health by Pt from last night. He let Ch know that he would like to move Pt to a hospital closer to [place] where he lives.   09:45 a.m. Ch ran into [place] in the hallway while he was talking to [place]. Nash Dimmer expressed concerns and processed his concerns with chaplains. Ch let him know that she will come and check in on him after he gets back to the room.   10:30 a.m. Ch visited with PT and Nash Dimmer. RN giving oral medications to Pt at this time. Pt, Nash Dimmer and Ch discussed AD completion details again. Ch will be paged to help notarization and witnessing process. Pt seemed to be in better spirits, was joking and trying to talk to [place]. Ch will follow-up more to support them.

## 2020-02-08 NOTE — Progress Notes (Signed)
Dr. Duncan at bedside 

## 2020-02-08 NOTE — Progress Notes (Signed)
°   02/07/20 2221  Assess: MEWS Score  Temp 100.2 F (37.9 C)  BP (!) 141/93  Pulse Rate (!) 164  ECG Heart Rate (!) 162  Resp (!) 36  SpO2 96 %  O2 Device Nasal Cannula  O2 Flow Rate (L/min) 2 L/min  Assess: MEWS Score  MEWS Temp 0  MEWS Systolic 0  MEWS Pulse 3  MEWS RR 3  MEWS LOC 0  MEWS Score 6  MEWS Score Color Red  Assess: if the MEWS score is Yellow or Red  Were vital signs taken at a resting state? Yes  Focused Assessment No change from prior assessment  Early Detection of Sepsis Score *See Row Information* Low  MEWS guidelines implemented *See Row Information* Yes (EKG done and Dr. Para March notified, awaiting orders.)  Treat  MEWS Interventions Other (Comment) (EKG done and MD notified, charge nurse, Alisa aware.)  Pain Scale 0-10  Pain Score 5  Take Vital Signs  Increase Vital Sign Frequency  Red: Q 1hr X 4 then Q 4hr X 4, if remains red, continue Q 4hrs  Escalate  MEWS: Escalate Red: discuss with charge nurse/RN and provider, consider discussing with RRT  Notify: Charge Nurse/RN  Name of Charge Nurse/RN Notified Alisa  Date Charge Nurse/RN Notified 02/07/20  Time Charge Nurse/RN Notified 2217  Notify: Provider  Provider Name/Title Dr. Lindajo Royal  Date Provider Notified 02/07/20  Time Provider Notified 2223  Notification Type Page  Notification Reason Change in status  Response See new orders  Date of Provider Response 02/07/20  Time of Provider Response 2226  Document  Patient Outcome Not stable and remains on department

## 2020-02-08 NOTE — Consult Note (Signed)
Cardiology Consult    Patient ID: [name] MRN: [id number], DOB/AGE: [birth year]   Admit date: 01/23/2020 Date of Consult: 02/08/2020  Primary Physician: Remi Haggard, FNP Primary Cardiologist: Ida Rogue, MD - new Requesting Provider: Imagene Riches, MD  Patient Profile    [name] is a 49 y.o. male with a history of ETOH and polysubstance abuse, who is being seen today for the evaluation of PAF w/ RVR in the setting of admission for fall, DTs/encephalopathy, sepsis, anemia, rhabdo, resp failure req intubation, and hepatic failure, at the request of Dr. Si Raider.  Past Medical History   Past Medical History:  Diagnosis Date   Alcohol abuse    Anemia    a. 01/2020 s/p fall w/ significant R hip hematoma-->4 u prbcs during admission.   Delirium tremens ([place]) 01/2020   Hepatic failure ([place])    a. 01/2020 - ETOH/sepsis/rhabdo   Polysubstance abuse ([place])    Respiratory failure ([place])    a. 01/2020 in setting of DTs/Sepsis req intubation.   Rhabdomyolysis 01/2020   Sepsis ([place]) 01/2020    History reviewed. No pertinent surgical history.   Allergies  No Known Allergies  History of Present Illness    49 year old male with a prior history of alcohol abuse.  He presents to the emergency department on September 1 following a mechanical fall in the setting of generalized weakness and intoxication, which resulted in significant right upper and lower extremity bruising.  In the emergency department, he was mildly tachycardic and hypokalemic.  He was noted to have abnormal LFTs with an AST of 293, ALT 83, alkaline phosphatase of 53, and total bilirubin 14.2.  Ammonia level was 42.  Hemoglobin was 6.7 with hematocrit of 19.1 and INR was 1.5.  Alcohol level is 296.  Stool was guaiac positive.  Right upper quadrant ultrasound revealed hepatic steatosis with finding suggestive of portal hypertension.  Right hip CT showed marked left lower soft tissue swelling suspicious for  overlying contusive change and/or hematoma and questionable lucency along the posterolateral aspect of the right femoral head neck junction that could reflect a nondisplaced fracture or some projectional periarticular spurring.  He was placed on IV Protonix, IV octreotide, Rocephin, and IV Mucomyst due to possible concern for Tylenol toxicity.  He remained in the emergency department between September 1 and September 6.  He was transfused with a total of 4 units of packed red blood cells.  He was seen by gastroenterology with initial plans for EGD but this was canceled in the setting of development of fever and hypotension concerning for sepsis.  Rhabdomyolysis was also noted with a peak CK of 2227.  Patient also began to exhibit signs of hepatic encephalopathy and delirium tremens.  With this, he had worsening dyspnea and required intubation on September 6.  He remains ventilated between September 6 and September 11 with improvement in hypoxia and alertness following extubation.  He was transferred up to the floor on September 15 and has been managed with thiamine, lactulose, rifaximin.  Hemoglobin and hematocrit have been stable.  He was seen by Ortho earlier this admission with recommendation for conservative management in the absence of any acute fractures.  On the evening of September 16, he developed atrial fibrillation with rapid ventricular response at 10:37 PM.  He was treated with IV diltiazem bolus followed by infusion and subsequently converted to sinus rhythm at approximately 6:25 AM.  Oral metoprolol was ordered and IV diltiazem was discontinued.  During his episode  of A. fib, noted to become febrile and tachypneic.  Chest x-ray was performed and showed mild pulmonary vascular congestion and was placed on intravenous Lasix.  We have been asked to evaluate this morning.  He currently offers no complaints though his brother, who is at bedside notes that patient seems more confused than yesterday.   Patient is speaking in short, choppy sentences and is noted to have some increased work of breathing.  Inpatient Medications     chlorhexidine gluconate (MEDLINE KIT)  15 mL Mouth Rinse BID   Chlorhexidine Gluconate Cloth  6 each Topical Daily   feeding supplement (ENSURE ENLIVE)  237 mL Oral TID BM   folic acid  1 mg Oral Daily   heparin  5,000 Units Subcutaneous Q8H   lactulose  30 g Oral BID   metoprolol tartrate  12.5 mg Oral BID   multivitamin with minerals  1 tablet Oral Daily   pantoprazole (PROTONIX) IV  40 mg Intravenous Q12H   potassium chloride  20 mEq Oral BID   rifaximin  550 mg Oral BID   sodium chloride flush  10-40 mL Intracatheter Q12H   sodium chloride flush  10-40 mL Intracatheter Q12H   thiamine  100 mg Oral Daily   traMADol  50 mg Oral Q6H    Family History    No premature CAD. History reviewed. No pertinent family history. has no family status information on file.    Social History    Social History   Socioeconomic History   Marital status: Single    Spouse name: Not on file   Number of children: Not on file   Years of education: Not on file   Highest education level: Not on file  Occupational History   Not on file  Tobacco Use   Smoking status: Never Smoker   Smokeless tobacco: Never Used  Vaping Use   Vaping Use: Never used  Substance and Sexual Activity   Alcohol use: Yes    Comment: 5-6 every other day   Drug use: Not Currently   Sexual activity: Not on file  Other Topics Concern   Not on file  Social History Narrative   Not on file   Social Determinants of Health   Financial Resource Strain:    Difficulty of Paying Living Expenses: Not on file  Food Insecurity:    Worried About [place] in the Last Year: Not on file   Ran Out of Food in the Last Year: Not on file  Transportation Needs:    Lack of Transportation (Medical): Not on file   Lack of Transportation (Non-Medical): Not on  file  Physical Activity:    Days of Exercise per Week: Not on file   Minutes of Exercise per Session: Not on file  Stress:    Feeling of Stress : Not on file  Social Connections:    Frequency of Communication with Friends and Family: Not on file   Frequency of Social Gatherings with Friends and Family: Not on file   Attends Religious Services: Not on file   Active Member of Clubs or Organizations: Not on file   Attends Archivist Meetings: Not on file   Marital Status: Not on file  Intimate Partner Violence:    Fear of Current or Ex-Partner: Not on file   Emotionally Abused: Not on file   Physically Abused: Not on file   Sexually Abused: Not on file     Review of Systems  General:  No chills, +++ fever, no night sweats or weight changes.  Cardiovascular:  No chest pain, +++ dyspnea, +++ edema, +++ orthopnea, no palpitations, paroxysmal nocturnal dyspnea. Dermatological: No rash, lesions/masses Respiratory: +++ cough, +++ dyspnea Urologic: No hematuria, dysuria Abdominal:   No nausea, vomiting, +++ diarrhea in setting of lactulose, no bright red blood per rectum, melena, or hematemesis Neurologic:  No visual changes, +++ wkns, +++ changes in mental status. All other systems reviewed and are otherwise negative except as noted above.  Physical Exam    Blood pressure (!) 143/82, pulse 77, temperature 99.4 F (37.4 C), temperature source Oral, resp. rate 18, height 5' 7.01" (1.702 m), weight 117.4 kg, SpO2 97 %.  General: Pleasant, NAD Psych: Flat affect. Neuro: Alert.  Speech is difficult to comprehend - freq, short sentences.  ? Orientation to time/place.  Follows commands. Moves all extremities spontaneously. HEENT: Normal  Neck: Supple, obese, difficult to gauge jvp. Nobruits. Lungs:  Resp regular and unlabored, diminished breath sounds bilat.  Pt spoke freq during exam. Heart: RRR no s3, s4, or murmurs. Abdomen: Obese, semi-firm, non-distended, BS +  x 4.  Extremities: No clubbing, cyanosis.  2+ bilat LE edema. DP/PT/Radials 2+ and equal bilaterally.  Labs    Cardiac Enzymes Recent Labs  Lab 01/20/20 1654  TROPONINIHS 39*      Lab Results  Component Value Date   WBC 13.2 (H) 02/08/2020   HGB 10.6 (L) 02/08/2020   HCT 32.7 (L) 02/08/2020   MCV 104.8 (H) 02/08/2020   PLT 167 02/08/2020    Recent Labs  Lab 02/08/20 0545  NA 143  K 3.4*  CL 108  CO2 25  BUN 23*  CREATININE 0.60*  CALCIUM 9.0  PROT 6.9  BILITOT 9.6*  ALKPHOS 52  ALT 82*  AST 136*  GLUCOSE 101*   Lab Results  Component Value Date   TRIG 824 (H) 01/31/2020     Radiology Studies    DG Abd 1 View  Result Date: 01/28/2020 CLINICAL DATA:  Orogastric tube placement EXAM: ABDOMEN - 1 VIEW COMPARISON:  None. FINDINGS: A nasogastric tube is present with side port in the proximal stomach and distal port in the stomach body. Lower thoracic and upper lumbar spondylosis. Mild vascular accentuation in the lung bases. IMPRESSION: 1. Nasogastric tube is present with side port in the proximal stomach body. 2. Mild vascular accentuation in the lung bases. Electronically Signed   By: Van Clines M.D.   On: 01/28/2020 15:04   CT PELVIS WO CONTRAST  Result Date: 01/24/2020 CLINICAL DATA:  Pain, question fracture EXAM: CT PELVIS WITHOUT CONTRAST TECHNIQUE: Multidetector CT imaging of the pelvis was performed following the standard protocol without intravenous contrast. COMPARISON:  None. FINDINGS: Urinary Tract: The visualized distal ureters and bladder appear unremarkable. Bowel: No bowel wall thickening, distention or surrounding inflammation identified within the pelvis. Vascular/Lymphatic: No enlarged pelvic lymph nodes identified. No significant vascular findings. Reproductive: The prostate is unremarkable. Other: Bilateral subcutaneous edema seen around the hips. Musculoskeletal: No fracture or dislocation. There is moderate bilateral hip osteoarthritis with  superior joint space loss and marginal osteophyte formation. There is a large heterogeneous hyperdense areas seen throughout the right gluteal musculature with expansion most notable within the gluteus medius muscle belly, likely intramuscular hematoma. There is also a small subcutaneous hyperdense collections seen overlying the left gluteal musculature, likely superficial hematomas. IMPRESSION: No acute osseous abnormality. Large intramuscular hematoma within the right gluteal musculature. Overlying subcutaneous edema and skin thickening. superficial  subcutaneous small hematoma seen overlying the left gluteal musculature. Electronically Signed   By: Prudencio Pair M.D.   On: 01/24/2020 02:45   DG Pelvis Portable  Result Date: 02/03/2020 CLINICAL DATA:  Fall. EXAM: PORTABLE PELVIS 1-2 VIEWS COMPARISON:  None. FINDINGS: Hips are located.  No pelvic fracture or sacral fracture. Radiodense ribbon like foreign body projects over the pubic symphysis. IMPRESSION: 1. No pelvic fracture. 2. Radiopaque foreign body projects over the pubic symphysis. Electronically Signed   By: Suzy Bouchard M.D.   On: 02/03/2020 05:49   MR HIP RIGHT WO CONTRAST  Result Date: 01/27/2020 CLINICAL DATA:  Right gluteal hematoma, for further characterization. EXAM: MR OF THE RIGHT HIP WITHOUT CONTRAST TECHNIQUE: Multiplanar, multisequence MR imaging was performed. No intravenous contrast was administered. COMPARISON:  CT pelvis 01/24/2020 FINDINGS: The patient terminated the exam following the completion of coronal T1 and T2 weighted images. These images are adversely affected by motion artifact. The patient did not allow completion of the exam, and due to these factors, diagnostic sensitivity and specificity of the examination is reduced. Bones: Accentuated T2 signal and reduced T1 signal in the pubic bodies bilaterally suspicious for osteitis pubis. No surrounding fluid collection to suggest infection. SI joints grossly unremarkable. No  observed fracture. Articular cartilage and labrum Articular cartilage: Difficult to assess given the lack of small field-of-view images and the motion artifact. Labrum: Today's exam is not diagnostic for assessment of the labrum. Joint or bursal effusion Joint effusion:  Absent Bursae: No bursitis. Muscles and tendons Muscles and tendons: Heterogeneous hematoma primarily in the right gluteus medius muscle, but also tracking up through the sciatic notch along the right piriformis muscle, measuring approximately 22.1 by 13.5 by 8.5 cm (volume = 1300 cm^3). By my estimates this is similar in volume to the prior CT from 01/24/2020, indicating no significant change over the 12 hours between the 2 studies. The hematoma appears to moderately impinge on the right sciatic nerve at and distal to the sciatic notch. Low-level asymmetric edema in the right gluteus maximus muscle. Trace edema along the right hip adductor musculature. Other findings Miscellaneous: We partially include apparent hematoma in the subcutaneous tissues lateral to the left gluteus musculature, better shown on the CT examination. There is subcutaneous edema along both buttock and flank regions. Mild edema in the retroperitoneum, presacral region, and along the right pelvic sidewall. IMPRESSION: 1. Heterogeneous hematoma primarily in the right gluteus medius muscle, but also tracking up through the sciatic notch along the right piriformis muscle. Similar volume to prior CT about 1300 cubic cm. The hematoma appears to moderately impinge on the right sciatic nerve at and distal to the sciatic notch. 2. Low-level edema in the right gluteus maximus and right hip adductor musculature. 3. Subcutaneous edema along both buttock and flank regions. 4. 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 5. Subcutaneous hematoma lateral to the left gluteal musculature, better shown on the prior CT. 6. Mild edema in the retroperitoneum, presacral region, and along the right pelvic sidewall. 7.  The patient terminated the exam following the completion of the exam, and due to motion artifact. The patient did not allow completion of the exam, and due to these factors, diagnostic sensitivity and specificity of the examination is reduced. Electronically Signed   By: Van Clines M.D.   On: 01/27/2020 15:44   US Venous Img Lower Bilateral (DVT)  Result Date: 02/06/2020 CLINICAL DATA:  Bilateral lower extremity pain and edema. Recent fall. Evaluate for DVT. EXAM: BILATERAL LOWER EXTREMITY VENOUS DOPPLER  ULTRASOUND TECHNIQUE: Gray-scale sonography with graded compression, as well as color Doppler and duplex ultrasound were performed to evaluate the lower extremity deep venous systems from the level of the common femoral vein and including the common femoral, femoral, profunda femoral, popliteal and calf veins including the posterior tibial, peroneal and gastrocnemius veins when visible. The superficial great saphenous vein was also interrogated. Spectral Doppler was utilized to evaluate flow at rest and with distal augmentation maneuvers in the common femoral, femoral and popliteal veins. COMPARISON:  None. FINDINGS: RIGHT LOWER EXTREMITY Common Femoral Vein: No evidence of thrombus. Normal compressibility, respiratory phasicity and response to augmentation. Saphenofemoral Junction: No evidence of thrombus. Normal compressibility and flow on color Doppler imaging. Profunda Femoral Vein: No evidence of thrombus. Normal compressibility and flow on color Doppler imaging. Femoral Vein: No evidence of thrombus. Normal compressibility, respiratory phasicity and response to augmentation. Popliteal Vein: No evidence of thrombus. Normal compressibility, respiratory phasicity and response to augmentation. Calf Veins: No evidence of thrombus. Normal compressibility and flow on color Doppler imaging. Superficial Great Saphenous Vein: No evidence of thrombus. Normal compressibility. Venous Reflux:  None. Other  Findings:  None. LEFT LOWER EXTREMITY Common Femoral Vein: No evidence of thrombus. Normal compressibility, respiratory phasicity and response to augmentation. Saphenofemoral Junction: No evidence of thrombus. Normal compressibility and flow on color Doppler imaging. Profunda Femoral Vein: No evidence of thrombus. Normal compressibility and flow on color Doppler imaging. Femoral Vein: No evidence of thrombus. Normal compressibility, respiratory phasicity and response to augmentation. Popliteal Vein: No evidence of thrombus. Normal compressibility, respiratory phasicity and response to augmentation. Calf Veins: No evidence of thrombus. Normal compressibility and flow on color Doppler imaging. Superficial Great Saphenous Vein: No evidence of thrombus. Normal compressibility. Venous Reflux:  None. Other Findings: Note is made of an approximately 22.1 x 5.7 x 3.0 cm minimally complex fluid collection extending from the left popliteal fossa. IMPRESSION: 1. No evidence of DVT within either lower extremity. 2. Note made of an approximately 22.1 cm minimally complex fluid collection extending from the left popliteal fossa, nonspecific with differential considerations including a leaking Baker's cyst though given provided history of recent fall conceivably a hematoma could have a similar appearance. Clinical correlation is advised. Electronically Signed   By: Sandi Mariscal M.D.   On: 02/06/2020 11:50   DG Chest Port 1 View  Result Date: 02/08/2020 CLINICAL DATA:  Fever EXAM: PORTABLE CHEST 1 VIEW COMPARISON:  02/01/2020 FINDINGS: Cardiomegaly, vascular congestion. Bilateral hilar prominence, favor vascular. Diffuse interstitial prominence throughout the lungs, likely edema. No effusions. No acute bony abnormality. IMPRESSION: Cardiomegaly with vascular congestion and interstitial prominence, likely mild edema. Bilateral hilar prominence, favor vascular. Electronically Signed   By: Rolm Baptise M.D.   On: 02/08/2020 05:23    DG Chest Port 1 View  Result Date: 02/01/2020 CLINICAL DATA:  Acute respiratory failure EXAM: PORTABLE CHEST 1 VIEW COMPARISON:  01/28/2020 FINDINGS: Endotracheal and enteric tubes are unchanged in position. Shallow inspiration. Cardiac enlargement with pulmonary vascular congestion. No definite edema or consolidation. No pleural effusions. No pneumothorax. IMPRESSION: Cardiac enlargement with pulmonary vascular congestion. No edema or consolidation. Electronically Signed   By: Lucienne Capers M.D.   On: 02/01/2020 01:58   DG Chest Port 1 View  Result Date: 01/28/2020 CLINICAL DATA:  Endotracheal tube placement for respiratory failure. Acute toxic metabolic encephalopathy. Acute liver failure complicated by rhabdomyolysis. EXAM: PORTABLE CHEST 1 VIEW COMPARISON:  None. FINDINGS: Endotracheal tube tip is 2.7 cm above the carina, satisfactorily position. Nasogastric tube  side port is in the vicinity of the gastric cardia with distal port in the stomach body. Mild enlargement of the cardiopericardial silhouette noted with indistinct pulmonary vasculature and cephalization of blood flow suspicious for pulmonary venous hypertension. Borderline appearance for interstitial edema. IMPRESSION: 1. Endotracheal tube tip is 2.7 cm above the carina, satisfactorily positioned. 2. Mild enlargement of the cardiopericardial silhouette with suspected pulmonary venous hypertension and borderline appearance for interstitial edema. Electronically Signed   By: Van Clines M.D.   On: 01/28/2020 15:08   DG Hip Unilat W or Wo Pelvis 2-3 Views Right  Result Date: 01/23/2020 CLINICAL DATA:  Right hip pain post fall EXAM: DG HIP (WITH OR WITHOUT PELVIS) 2-3V RIGHT COMPARISON:  None. FINDINGS: There is marked lateral soft tissue swelling of the right hip. Suspicious for overlying contusive change and/or hematoma. There is a questionable lucency along the posterolateral aspect of the right femoral head neck junction which  could reflect a nondisplaced fracture or some projectional periarticular spurring. Additional suspicious lucency is seen along the junction of the left inferior pubic ramus and pubic body best seen on the lateral hip radiograph. Could correlate for point tenderness in this location. There is a background of diffuse degenerative change in the lower spine, hips and pelvis. Enthesopathic changes noted as well at the iliac crest, ischial tuberosities and lesser trochanters bilaterally. IMPRESSION: 1. Marked lateral soft tissue swelling of the right hip. Suspicious for overlying contusive change and/or hematoma. 2. Questionable lucency along the posterolateral aspect of the right femoral head neck junction which could reflect a nondisplaced fracture or some projectional periarticular spurring. 3. Additional suspicious lucency along the junction of the left inferior pubic ramus and pubic body best seen on the lateral radiograph. 4. Given these latter findings, further evaluation with a bony pelvis CT could be obtained for more definitive assessment. Electronically Signed   By: Lovena Le M.D.   On: 01/23/2020 21:55   [place] EKG SITE RITE  Result Date: 01/28/2020 If Site Rite image not attached, placement could not be confirmed due to current cardiac rhythm.  US Abdomen Limited RUQ  Result Date: 01/23/2020 CLINICAL DATA:  Elevated liver enzymes EXAM: ULTRASOUND ABDOMEN LIMITED RIGHT UPPER QUADRANT COMPARISON:  None. FINDINGS: Gallbladder: No gallstones or wall thickening visualized. No sonographic Murphy sign noted by sonographer. Common bile duct: Diameter: 5.9 mm Liver: Increased echogenicity seen throughout the liver parenchyma. No focal hepatic lesion. No biliary ductal dilatation. The portal vein is patent, with reversal of flow however and recanalized umbilical vein. Other: None. IMPRESSION: Hepatic steatosis Reversal of flow in the portal vein with findings suggestive of portal hypertension Electronically Signed    By: Prudencio Pair M.D.   On: 01/23/2020 21:21    ECG & Cardiac Imaging    Afib, 110, nonspecific T changes - personally reviewed.  Assessment & Plan    1. PAF w/ RVR: Patient with complex hospitalization in the setting of hepatic encephalopathy, sepsis, anemia requiring transfusions, and hypoxic respiratory failure requiring prolonged intubation.  He was transferred up to the floor September 15 and developed atrial fibrillation with rapid ventricular response on the evening of September 16, which persisted for approximately 8 hours and broke on IV diltiazem.  He was also noted to develop a fever during that period of time and a follow-up chest x-ray showed mild heart failure.  He is currently in sinus rhythm.  CHA2DS2-VASc is likely 0-1 (currently hypertensive).  He does have some volume overload currently however he is also +  4 L since admission in the setting of sepsis.  Continue beta-blocker therapy.  We will hold off on anticoagulation in the setting of low CHA2DS2-VASc and elevated bleeding risk in the setting of liver failure.  Supplement magnesium (ordered).  Check TSH.  Check echo.  2.  Acute congestive heart failure: Patient volume overloaded this morning with chest x-ray showing CHF.  Agree with IV diuresis.  He received 20 mg IV earlier this morning and then 40 mg IV at 9 AM.  I suspect he will need at least a day or 2 of ongoing IV diuresis and will add 40 IV twice daily for the time being.  Continue beta-blocker for heart rate and blood pressure management.  May need additional agent (?  Spironolactone) follow-up echo as outlined above.    3.  Sepsis: Prolonged hospitalization complicated by sepsis and respiratory failure.  Patient febrile again overnight.  Antibiotic therapy per internal medicine.  4.  Hepatic encephalopathy: Patient remains on lactulose and rifaximin.  LFTs higher over the past 24 hours than they have been in several days.  Patient reports good response to lactulose  last yesterday and 4 bowel movements since morning however, nursing reports no bowel movements as of yet today.  Patient's brother notes more confusion this morning.  Ongoing therapy per internal medicine.  5.  Macrocytic anemia: In the setting of alcoholism with acute drop in the setting of large right thigh hematoma status post fall.  There is question of GI bleeding and he was previously seen by GI with initial plan for EGD which was canceled secondary to clinical instability.  Given stable H&H, GI signed off and patient remains on PPI therapy.  6.  Acute hypoxic respiratory failure: Status post prolonged intubation between September 6 and September 11.  Some increased work of breathing overnight in the setting of rapid atrial fibrillation and volume overload.  Follow closely with IV diuresis.  7.  Hypomagnesemia: Supplementation ordered yesterday and again today.  Signed, Murray Hodgkins, NP 02/08/2020, 1:02 PM  For questions or updates, please contact   Please consult [email or web address] for contact info under Cardiology/STEMI.

## 2020-02-09 DIAGNOSIS — E7800 Pure hypercholesterolemia, unspecified: Secondary | ICD-10-CM

## 2020-02-09 DIAGNOSIS — I480 Paroxysmal atrial fibrillation: Secondary | ICD-10-CM

## 2020-02-09 LAB — GLUCOSE, CAPILLARY
Glucose-Capillary: 74 mg/dL (ref 70–99)
Glucose-Capillary: 86 mg/dL (ref 70–99)
Glucose-Capillary: 90 mg/dL (ref 70–99)
Glucose-Capillary: 97 mg/dL (ref 70–99)
Glucose-Capillary: 99 mg/dL (ref 70–99)

## 2020-02-09 LAB — PHOSPHORUS: Phosphorus: 4 mg/dL (ref 2.5–4.6)

## 2020-02-09 LAB — COMPREHENSIVE METABOLIC PANEL
ALT: 70 U/L — ABNORMAL HIGH (ref 0–44)
AST: 107 U/L — ABNORMAL HIGH (ref 15–41)
Albumin: 2.8 g/dL — ABNORMAL LOW (ref 3.5–5.0)
Alkaline Phosphatase: 50 U/L (ref 38–126)
Anion gap: 10 (ref 5–15)
BUN: 20 mg/dL (ref 6–20)
CO2: 28 mmol/L (ref 22–32)
Calcium: 9 mg/dL (ref 8.9–10.3)
Chloride: 103 mmol/L (ref 98–111)
Creatinine, Ser: 0.65 mg/dL (ref 0.61–1.24)
GFR calc Af Amer: >60 mL/min (ref >60)
GFR calc non Af Amer: >60 mL/min (ref >60)
Glucose, Bld: 95 mg/dL (ref 70–99)
Potassium: 3.2 mmol/L — ABNORMAL LOW (ref 3.5–5.1)
Sodium: 141 mmol/L (ref 135–145)
Total Bilirubin: 10 mg/dL — ABNORMAL HIGH (ref 0.3–1.2)
Total Protein: 6.6 g/dL (ref 6.5–8.1)

## 2020-02-09 LAB — CBC
HCT: 31.3 % — ABNORMAL LOW (ref 39.0–52.0)
Hemoglobin: 10.5 g/dL — ABNORMAL LOW (ref 13.0–17.0)
MCH: 34.8 pg — ABNORMAL HIGH (ref 26.0–34.0)
MCHC: 33.5 g/dL (ref 30.0–36.0)
MCV: 103.6 fL — ABNORMAL HIGH (ref 80.0–100.0)
Platelets: 143 10*3/uL — ABNORMAL LOW (ref 150–400)
RBC: 3.02 MIL/uL — ABNORMAL LOW (ref 4.22–5.81)
RDW: 26.5 % — ABNORMAL HIGH (ref 11.5–15.5)
WBC: 12 10*3/uL — ABNORMAL HIGH (ref 4.0–10.5)
nRBC: 0.2 % (ref 0.0–0.2)

## 2020-02-09 LAB — MAGNESIUM: Magnesium: 1.5 mg/dL — ABNORMAL LOW (ref 1.7–2.4)

## 2020-02-09 LAB — TSH: TSH: 3.676 u[IU]/mL (ref 0.350–4.500)

## 2020-02-09 MED ORDER — MAGNESIUM SULFATE 2 GM/50ML IV SOLN
2.0000 g | Freq: Once | INTRAVENOUS | Status: AC
  Administered 2020-02-09: 2 g via INTRAVENOUS
  Filled 2020-02-09: qty 50

## 2020-02-09 MED ORDER — POTASSIUM CHLORIDE CRYS ER 20 MEQ PO TBCR
20.0000 meq | EXTENDED_RELEASE_TABLET | Freq: Three times a day (TID) | ORAL | Status: AC
  Administered 2020-02-09: 20 meq via ORAL
  Filled 2020-02-09: qty 1

## 2020-02-09 NOTE — Progress Notes (Addendum)
PROGRESS NOTE    [name]  WKG:[id number] DOB: 11-Sep-1970 DOA: 01/23/2020 PCP: Remi Haggard, FNP  Outpatient Specialists:     Brief Narrative:  Cody Resor [name] an 49 y.o.malewith a known history of polysubstance abuse including alcohol abuse who presented to the emergency room with acute onset of increased generalized weakness and amechanicalfall.Alcohol level 296 on admission. Patient was transferred to ICU on 6 September due to increased work of breathing and accessory muscle use. He was withdrawing from alcohol required intubation and mechanical ventilation.  Events: 9/6 transferred to ICU for increased WOB, using accessory muscles to breathe with liver failure 9/7 remains on vent, liver failure, PICC line placed, brother updated 9/8 remains encephalopathic, liver failure, resp failure 9/10 remains on ventilator. Long discussion and update with brother. Sedatives switch to Precedex and fentanyl, continue CIWA scale 9/11still with some agitation when sedation is lightened. Intolerance to SBT due to agitation. 9/12 extubated 9/13 hypoxia improving 9/14 more alert awake, DT's improving, transferred out of ICU 9/15 more alert, DTs continue to improve 9/16 more alert, DTs improving still. Overnight a fib with rvr and fever, cxr showing pulmonary congestion. Treated with IV lasix and diltiazem drip and broad spectrum abx, dilt trip weaned off after a few hours.    Assessment & Plan:   Principal Problem:   Sepsis ([place]) Active Problems:   Acute blood loss anemia   GIB (gastrointestinal bleeding)   Transaminitis   Hyponatremia   Hypokalemia   Falls   Hip pain   Rhabdomyolysis   Hypovolemia   Hypotension   Alcohol abuse   Goals of care, counseling/discussion   Palliative care by specialist  # new-onset atrial fibrillation w/ RVR - occurred night of 9/16, in setting of fluid overload, poss infection. CXR with pulmonary congestion. Treated w/ iv  lasix and dilt drip, dilt now off and has resumed home metoprolol, rate controlled appears to be in sinus. With 2+ pittine LE edema still, appears to be net negative this hospitalization (though not clear if outs have been accurately recorded) - cont lasix 40 mg IV bid, out 4300 yesterday - replete lytes - cont metoprolo - cardiology consulted  # Family request of transfer to [facility] - called [facility] transfer center and told due to covid surge they are not currently accepting outside transfers  # Fever - in setting of a fib w/ rvr. Now abated. ua unremarkable. CXR without signs infection but CT showing multifocal opacities. Blood cultures drawn and neg to date (has picc). Started on broad-spectrum abx. May be sequelae of DTs. No fluid on abd u/s, no sig effusion on chest CT - cont vanc/zosyn (9/17 >, stop flagyl  - f/u blood cultures  # Hypoxic hypercapnic respiratory failure due to multiorgan liver failure and aspirationi pneumonia. Now extubated. Mild tachypnea continues.  - will trial weaning O2 today - incentive spirometry  # Acute alcohol hepatitis with hepatic encephalopathy - stable - cont lactulose and bid ppi - will discuss w/ GI today re any further inpatient recs. Holding on steroids given above infection  # Electrolytes - k 3.2, will order 120 oral - mg 1.5, will order 2 g IV  # Acute diastolic heart failure - appears fluid overloaded today with sig LE edema - lasix and cardiology consult as above  # Macrocytic anemia - H 10s, stable. B12/folate wnl  # Delirium tremens  - improving, s/p precedex - cont ativan prn - cont thiamine, lactulose, rifaximin. Cont bid lactulose, 3 BMs  last 24 hours per nursing  # Right gluteal hematoma - seen by ortho this hospitalization, advised no intervention. With significant dependent bruising right leg. - PVLs ordered by night team to eval for DVT, negative for DVT    DVT prophylaxis: heparin Code Status: full Family  Communication: brother updated @ bedside Disposition Plan: needs SNF, has no insurance, care mgmt to look into charity outpt pt/ot. Has been declined by inpatient rehab   Consultants:   GI and ortho signed off   Procedures:    Antimicrobials:   s/p cefepime 7 days then ceftriaxone 5 days 9/6-9/13.\  Vanc/zosyn/flagyl 9/17 >  Lines: left picc  Subjective: Mildly tremulous, denies pain. No fever last 24 hours. Tolerating diet.   Objective: Vitals:   02/08/20 2100 02/09/20 0319 02/09/20 0801 02/09/20 0900  BP: (!) 141/64 140/82 135/76 135/76  Pulse: 76 80 75 80  Resp: (!) $RemoveB'23 16 19   'epeqVnUm$ Temp:  97.7 F (36.5 C) 99.4 F (37.4 C)   TempSrc:  Oral Oral   SpO2: 99% 91% 97%   Weight:  108.7 kg    Height:        Intake/Output Summary (Last 24 hours) at 02/09/2020 1003 Last data filed at 02/09/2020 0321 Gross per 24 hour  Intake 120 ml  Output 3900 ml  Net -3780 ml   Filed Weights   02/06/20 0541 02/08/20 0407 02/09/20 0319  Weight: 117.3 kg 117.4 kg 108.7 kg    Examination:  General exam: mild tremor, NAD Respiratory system: poor effort, scattered rhonchi, tachypnic to low 30s Cardiovascular system: S1 & S2 heard, RRR. Mild systolic murmur Gastrointestinal system: abd moderately distended, non tender Central nervous system: Alert , oriented to self Extremities: Symmetric 5 x 5 power. Skin: echymoses dependent side of b/l LEs right worse than left. improving Psychiatry: not agitated    Data Reviewed:   CBC: Recent Labs  Lab 02/04/20 0430 02/04/20 0830 02/05/20 0440 02/06/20 0646 02/07/20 0739 02/08/20 0545 02/09/20 0645  WBC 10.0  --  11.8* 15.0* 13.3* 13.2* 12.0*  NEUTROABS 7.6  --  8.6* 10.9* 9.3* 9.0*  --   HGB 9.5*   < > 9.8* 9.5* 9.6* 10.6* 10.5*  HCT 29.0*   < > 31.8* 30.2* 30.6* 32.7* 31.3*  MCV 102.1*  --  107.1* 107.9* 108.5* 104.8* 103.6*  PLT 171  --  167 191 177 167 143*   < > = values in this interval not displayed.   Basic Metabolic  Panel: Recent Labs  Lab 02/05/20 0440 02/06/20 0646 02/07/20 0739 02/08/20 0545 02/09/20 0645  NA 148* 145 143 143 141  K 4.5 3.7 3.6 3.4* 3.2*  CL 113* 110 108 108 103  CO2 $Re'26 25 24 25 28  'QZF$ GLUCOSE 105* 108* 92 101* 95  BUN 46* 31* 22* 23* 20  CREATININE 0.91 0.86 0.68 0.60* 0.65  CALCIUM 8.5* 8.9 8.9 9.0 9.0  MG 2.0 1.8 1.6* 1.6* 1.5*  PHOS 3.5 3.5  3.5 3.8  3.9 4.3 4.0   GFR: Estimated Creatinine Clearance: 131.3 mL/min (by C-G formula based on SCr of 0.65 mg/dL). Liver Function Tests: Recent Labs  Lab 02/06/20 0500 02/06/20 0646 02/07/20 0739 02/08/20 0545 02/09/20 0645  AST 145*  --  154* 136* 107*  ALT 69*  --  83* 82* 70*  ALKPHOS 53  --  46 52 50  BILITOT 8.5*  --  9.1* 9.6* 10.0*  PROT 6.6  --  6.5 6.9 6.6  ALBUMIN 2.7* 2.7* 2.7*  2.7* 2.8* 2.8*   No results for input(s): LIPASE, AMYLASE in the last 168 hours. Recent Labs  Lab 02/02/20 1800  AMMONIA 37*   Coagulation Profile: Recent Labs  Lab 02/08/20 0545  INR 1.5*   Cardiac Enzymes: Recent Labs  Lab 02/02/20 1357  CKTOTAL 210   BNP (last 3 results) No results for input(s): PROBNP in the last 8760 hours. HbA1C: No results for input(s): HGBA1C in the last 72 hours. CBG: Recent Labs  Lab 02/08/20 1123 02/08/20 1658 02/08/20 2011 02/09/20 0020 02/09/20 0814  GLUCAP 99 94 88 90 86   Lipid Profile: No results for input(s): CHOL, HDL, LDLCALC, TRIG, CHOLHDL, LDLDIRECT in the last 72 hours. Thyroid Function Tests: Recent Labs    02/09/20 0645  TSH 3.676   Anemia Panel: Recent Labs    02/07/20 0739  VITAMINB12 1,060*  FOLATE 10.3   Urine analysis:    Component Value Date/Time   COLORURINE AMBER (A) 02/08/2020 0455   APPEARANCEUR HAZY (A) 02/08/2020 0455   LABSPEC 1.026 02/08/2020 0455   PHURINE 5.0 02/08/2020 0455   GLUCOSEU NEGATIVE 02/08/2020 0455   HGBUR SMALL (A) 02/08/2020 0455   BILIRUBINUR NEGATIVE 02/08/2020 0455   [place] 02/08/2020 0455   PROTEINUR  30 (A) 02/08/2020 0455   NITRITE NEGATIVE 02/08/2020 0455   LEUKOCYTESUR NEGATIVE 02/08/2020 0455   Sepsis Labs: $RemoveBefo'@LABRCNTIP'WwkXnEIzMIe$ (procalcitonin:4,lacticidven:4)  ) Recent Results (from the past 240 hour(s))  Culture, blood (x 2)     Status: None (Preliminary result)   Collection Time: 02/08/20  5:45 AM   Specimen: BLOOD  Result Value Ref Range Status   Specimen Description BLOOD LINE  Final   Special Requests   Final    BOTTLES DRAWN AEROBIC AND ANAEROBIC Blood Culture results may not be optimal due to an excessive volume of blood received in culture bottles   Culture   Final    NO GROWTH 1 DAY Performed at [facility], [address], [place], [place] [address]    Report Status PENDING  Incomplete  Culture, blood (x 2)     Status: None (Preliminary result)   Collection Time: 02/08/20  7:16 AM   Specimen: BLOOD  Result Value Ref Range Status   Specimen Description BLOOD RIGHT ANTECUBITAL  Final   Special Requests   Final    BOTTLES DRAWN AEROBIC AND ANAEROBIC Blood Culture adequate volume   Culture   Final    NO GROWTH < 24 HOURS Performed at [facility], [address], [place], [place] [address]    Report Status PENDING  Incomplete         Radiology Studies: CT CHEST WO CONTRAST  Result Date: 02/08/2020 CLINICAL DATA:  Pleural effusion EXAM: CT CHEST WITHOUT CONTRAST TECHNIQUE: Multidetector CT imaging of the chest was performed following the standard protocol without IV contrast. COMPARISON:  None. FINDINGS: Cardiovascular: Moderate multi-vessel coronary artery calcification. Global cardiac size within normal limits. No pericardial effusion. The central pulmonary arteries are enlarged in keeping with changes of pulmonary arterial hypertension. The thoracic aorta is unremarkable save for bovine arch anatomy. Mediastinum/Nodes: No pathologic thoracic adenopathy. Soft tissue infiltration within the anterior mediastinum may represent rebound thymic hyperplasia  in the setting of acute infection. No discrete anterior mediastinal mass identified. Esophagus unremarkable. Visualized thyroid unremarkable. Lungs/Pleura: There are scattered nodular infiltrates noted within the right upper lobe, lower lobes, and basilar lingula likely infectious or inflammatory in the acute setting. Small bilateral pleural effusions are present with associated mild bibasilar compressive  atelectasis. No pneumothorax. Central airways are widely patent. Upper Abdomen: There is recanalization of the umbilical vein as well as gastroesophageal varices identified in keeping with changes of portal venous hypertension possibly related to hepatocellular disease. This is not well assessed on this examination. Musculoskeletal: No chest wall mass or suspicious bone lesions identified. IMPRESSION: 1. Multifocal pulmonary infiltrates most in keeping with acute atypical infection the appropriate clinical setting. 2. Moderate coronary artery calcification. Morphologic changes in keeping with pulmonary arterial hypertension. 3. Small bilateral pleural effusions. 4. Findings in keeping with portal venous hypertension, possibly related to underlying hepatocellular disease. Electronically Signed   By: Fidela Salisbury MD   On: 02/08/2020 19:37   US Abdomen Limited  Result Date: 02/08/2020 CLINICAL DATA:  Ascites. EXAM: LIMITED ABDOMEN ULTRASOUND FOR ASCITES TECHNIQUE: Limited ultrasound survey for ascites was performed in all four abdominal quadrants. COMPARISON:  None. FINDINGS: No ascites is noted in any quadrant of the abdomen. IMPRESSION: No ascites is noted. Electronically Signed   By: Marijo Conception M.D.   On: 02/08/2020 14:00   DG Chest Port 1 View  Result Date: 02/08/2020 CLINICAL DATA:  Fever EXAM: PORTABLE CHEST 1 VIEW COMPARISON:  02/01/2020 FINDINGS: Cardiomegaly, vascular congestion. Bilateral hilar prominence, favor vascular. Diffuse interstitial prominence throughout the lungs, likely edema. No  effusions. No acute bony abnormality. IMPRESSION: Cardiomegaly with vascular congestion and interstitial prominence, likely mild edema. Bilateral hilar prominence, favor vascular. Electronically Signed   By: Rolm Baptise M.D.   On: 02/08/2020 05:23   ECHOCARDIOGRAM COMPLETE  Result Date: 02/08/2020    ECHOCARDIOGRAM REPORT   Patient Name:   BRENDT DIBLE Date of Exam: 02/08/2020 Medical Rec #:  [id number]         Height:       67.0 in Accession #:    [id number]        Weight:       258.8 lb Date of Birth:  1971/01/08        BSA:          2.256 m Patient Age:    52 years          BP:           136/77 mmHg Patient Gender: M                 HR:           77 bpm. Exam Location:  [facility] Procedure: 2D Echo, Cardiac Doppler and Color Doppler Indications:     Atrial Fibrillation 427.31  History:         Patient has no prior history of Echocardiogram examinations.                  Alcohol abuse, sepsis.  Sonographer:     Sherrie Sport RDCS (AE) Referring Phys:  [place] Diagnosing Phys: Ida Rogue MD IMPRESSIONS  1. Left ventricular ejection fraction, by estimation, is 60 to 65%. The left ventricle has normal function. The left ventricle has no regional wall motion abnormalities. Left ventricular diastolic parameters were normal.  2. Right ventricular systolic function is normal. The right ventricular size is normal. There is mildly elevated pulmonary artery systolic pressure. The estimated right ventricular systolic pressure is 75.9 mmHg.  3. Left atrial size was mildly dilated.  4. Mild mitral valve regurgitation. FINDINGS  Left Ventricle: Left ventricular ejection fraction, by estimation, is 60 to 65%. The left ventricle has normal function. The left ventricle has no regional wall  motion abnormalities. The left ventricular internal cavity size was normal in size. There is  no left ventricular hypertrophy. Left ventricular diastolic parameters were normal. Right Ventricle: The right ventricular  size is normal. No increase in right ventricular wall thickness. Right ventricular systolic function is normal. There is mildly elevated pulmonary artery systolic pressure. The tricuspid regurgitant velocity is 2.96  m/s, and with an assumed right atrial pressure of 5 mmHg, the estimated right ventricular systolic pressure is 24.0 mmHg. Left Atrium: Left atrial size was mildly dilated. Right Atrium: Right atrial size was normal in size. Pericardium: There is no evidence of pericardial effusion. Mitral Valve: The mitral valve is normal in structure. Mild mitral valve regurgitation. No evidence of mitral valve stenosis. Tricuspid Valve: The tricuspid valve is normal in structure. Tricuspid valve regurgitation is mild . No evidence of tricuspid stenosis. Aortic Valve: The aortic valve is normal in structure. Aortic valve regurgitation is not visualized. No aortic stenosis is present. Aortic valve mean gradient measures 11.0 mmHg. Aortic valve peak gradient measures 19.0 mmHg. Aortic valve area, by VTI measures 3.09 cm. Pulmonic Valve: The pulmonic valve was normal in structure. Pulmonic valve regurgitation is not visualized. No evidence of pulmonic stenosis. Aorta: The aortic root is normal in size and structure. Venous: The inferior vena cava is normal in size with greater than 50% respiratory variability, suggesting right atrial pressure of 3 mmHg. IAS/Shunts: No atrial level shunt detected by color flow Doppler.  LEFT VENTRICLE PLAX 2D LVIDd:         4.83 cm  Diastology LVIDs:         3.13 cm  LV e' medial:    10.10 cm/s LV PW:         1.26 cm  LV E/e' medial:  12.1 LV IVS:        1.28 cm  LV e' lateral:   13.90 cm/s LVOT diam:     2.00 cm  LV E/e' lateral: 8.8 LV SV:         123 LV SV Index:   55 LVOT Area:     3.14 cm  LEFT ATRIUM              Index       RIGHT ATRIUM           Index LA diam:        4.70 cm  2.08 cm/m  RA Area:     23.30 cm LA Vol (A2C):   113.0 ml 50.08 ml/m RA Volume:   62.40 ml  27.66  ml/m LA Vol (A4C):   96.2 ml  42.64 ml/m LA Biplane Vol: 107.0 ml 47.42 ml/m  AORTIC VALVE                    PULMONIC VALVE AV Area (Vmax):    2.81 cm     PV Vmax:        0.92 m/s AV Area (Vmean):   2.69 cm     PV Peak grad:   3.4 mmHg AV Area (VTI):     3.09 cm     RVOT Peak grad: 6 mmHg AV Vmax:           218.00 cm/s AV Vmean:          153.000 cm/s AV VTI:            0.400 m AV Peak Grad:      19.0 mmHg AV Mean Grad:  11.0 mmHg LVOT Vmax:         195.00 cm/s LVOT Vmean:        131.000 cm/s LVOT VTI:          0.393 m LVOT/AV VTI ratio: 0.98  AORTA Ao Root diam: 2.70 cm MITRAL VALVE                TRICUSPID VALVE MV Area (PHT): 3.23 cm     TR Peak grad:   35.0 mmHg MV Decel Time: 235 msec     TR Vmax:        296.00 cm/s MV E velocity: 122.00 cm/s MV A velocity: 73.70 cm/s   SHUNTS MV E/A ratio:  1.66         Systemic VTI:  0.39 m                             Systemic Diam: 2.00 cm Ida Rogue MD Electronically signed by Ida Rogue MD Signature Date/Time: 02/08/2020/1:50:54 PM    Final         Scheduled Meds: . chlorhexidine gluconate (MEDLINE KIT)  15 mL Mouth Rinse BID  . Chlorhexidine Gluconate Cloth  6 each Topical Daily  . feeding supplement (ENSURE ENLIVE)  237 mL Oral TID BM  . folic acid  1 mg Oral Daily  . furosemide  40 mg Intravenous BID  . heparin  5,000 Units Subcutaneous Q8H  . lactulose  30 g Oral BID  . metoprolol tartrate  37.5 mg Oral BID  . multivitamin with minerals  1 tablet Oral Daily  . pantoprazole (PROTONIX) IV  40 mg Intravenous Q12H  . potassium chloride  20 mEq Oral TID  . rifaximin  550 mg Oral BID  . sodium chloride flush  10-40 mL Intracatheter Q12H  . sodium chloride flush  10-40 mL Intracatheter Q12H  . thiamine  100 mg Oral Daily  . traMADol  50 mg Oral Q6H   Continuous Infusions: . sodium chloride Stopped (02/07/20 1335)  . sodium chloride    . ceFEPime (MAXIPIME) IV 2 g (02/09/20 0057)  . magnesium sulfate bolus IVPB    . metronidazole  500 mg (02/09/20 0650)  . vancomycin 1,000 mg (02/09/20 0354)     LOS: 17 days    Time spent: [place], MD [facility] Pager [phone]  If 7PM-7AM, please contact night-coverage [email or web address] Password [facility] 02/09/2020, 10:03 AM

## 2020-02-09 NOTE — Progress Notes (Signed)
Cody Lame, MD [facility]   [address] [place], [place] [address] Phone: [phone] Fax : [phone]   Subjective: The patient is nonverbal but awake. The patient's brother is at the bedside. I have been called to see the patient again for liver failure. The patient's meld score on admission was 27 with a 19.6 predicted 20-month mortality. The repeat meld score today is 20 which is also a 19.6% 70-month mortality.   Objective: Vital signs in last 24 hours: Vitals:   02/09/20 0801 02/09/20 0900 02/09/20 1000 02/09/20 1129  BP: 135/76 135/76  (!) 153/77  Pulse: 75 80 82 79  Resp: 19  (!) 36 20  Temp: 99.4 F (37.4 C)   98.9 F (37.2 C)  TempSrc: Oral   Axillary  SpO2: 97%  94% 95%  Weight:      Height:       Weight change: -8.691 kg  Intake/Output Summary (Last 24 hours) at 02/09/2020 1307 Last data filed at 02/09/2020 1132 Gross per 24 hour  Intake 360 ml  Output 5900 ml  Net -5540 ml     Exam: Heart:: Regular rate and rhythm, S1S2 present or without murmur or extra heart sounds Lungs: normal and clear to auscultation and percussion Abdomen: soft, nontender, normal bowel sounds   Lab Results: $[email or web address]'@[email or web address]'[id number]$ @ Micro Results: Recent Results (from the past 240 hour(s))  Culture, blood (x 2)     Status: None (Preliminary result)   Collection Time: 02/08/20  5:45 AM   Specimen: BLOOD  Result Value Ref Range Status   Specimen Description BLOOD LINE  Final   Special Requests   Final    BOTTLES DRAWN AEROBIC AND ANAEROBIC Blood Culture results may not be optimal due to an excessive volume of blood received in culture bottles   Culture   Final    NO GROWTH 1 DAY Performed at [facility], [address] [name], [place] [address]    Report Status PENDING  Incomplete  Culture, blood (x 2)     Status: None (Preliminary result)   Collection Time: 02/08/20  7:16 AM   Specimen: BLOOD  Result Value Ref Range Status   Specimen Description BLOOD RIGHT  ANTECUBITAL  Final   Special Requests   Final    BOTTLES DRAWN AEROBIC AND ANAEROBIC Blood Culture adequate volume   Culture   Final    NO GROWTH < 24 HOURS Performed at [facility], [address] [place],  [address]    Report Status PENDING  Incomplete   Studies/Results: CT CHEST WO CONTRAST  Result Date: 02/08/2020 CLINICAL DATA:  Pleural effusion EXAM: CT CHEST WITHOUT CONTRAST TECHNIQUE: Multidetector CT imaging of the chest was performed following the standard protocol without IV contrast. COMPARISON:  None. FINDINGS: Cardiovascular: Moderate multi-vessel coronary artery calcification. Global cardiac size within normal limits. No pericardial effusion. The central pulmonary arteries are enlarged in keeping with changes of pulmonary arterial hypertension. The thoracic aorta is unremarkable save for bovine arch anatomy. Mediastinum/Nodes: No pathologic thoracic adenopathy. Soft tissue infiltration within the anterior mediastinum may represent rebound thymic hyperplasia in the setting of acute infection. No discrete anterior mediastinal mass identified. Esophagus unremarkable. Visualized thyroid unremarkable. Lungs/Pleura: There are scattered nodular infiltrates noted within the right upper lobe, lower lobes, and basilar lingula likely infectious or inflammatory in the acute setting. Small bilateral pleural effusions are present with associated mild bibasilar compressive atelectasis. No pneumothorax. Central airways are widely patent. Upper Abdomen: There is recanalization of the umbilical  vein as well as gastroesophageal varices identified in keeping with changes of portal venous hypertension possibly related to hepatocellular disease. This is not well assessed on this examination. Musculoskeletal: No chest wall mass or suspicious bone lesions identified. IMPRESSION: 1. Multifocal pulmonary infiltrates most in keeping with acute atypical infection the appropriate clinical setting.  2. Moderate coronary artery calcification. Morphologic changes in keeping with pulmonary arterial hypertension. 3. Small bilateral pleural effusions. 4. Findings in keeping with portal venous hypertension, possibly related to underlying hepatocellular disease. Electronically Signed   By: Fidela Salisbury MD   On: 02/08/2020 19:37   US Abdomen Limited  Result Date: 02/08/2020 CLINICAL DATA:  Ascites. EXAM: LIMITED ABDOMEN ULTRASOUND FOR ASCITES TECHNIQUE: Limited ultrasound survey for ascites was performed in all four abdominal quadrants. COMPARISON:  None. FINDINGS: No ascites is noted in any quadrant of the abdomen. IMPRESSION: No ascites is noted. Electronically Signed   By: Marijo Conception M.D.   On: 02/08/2020 14:00   DG Chest Port 1 View  Result Date: 02/08/2020 CLINICAL DATA:  Fever EXAM: PORTABLE CHEST 1 VIEW COMPARISON:  02/01/2020 FINDINGS: Cardiomegaly, vascular congestion. Bilateral hilar prominence, favor vascular. Diffuse interstitial prominence throughout the lungs, likely edema. No effusions. No acute bony abnormality. IMPRESSION: Cardiomegaly with vascular congestion and interstitial prominence, likely mild edema. Bilateral hilar prominence, favor vascular. Electronically Signed   By: Rolm Baptise M.D.   On: 02/08/2020 05:23   ECHOCARDIOGRAM COMPLETE  Result Date: 02/08/2020    ECHOCARDIOGRAM REPORT   Patient Name:   [name] Date of Exam: 02/08/2020 Medical Rec #:  [id number]         Height:       67.0 in Accession #:    [id number]        Weight:       258.8 lb Date of Birth:  [birth year]        BSA:          2.256 m Patient Age:    49 years          BP:           136/77 mmHg Patient Gender: M                 HR:           77 bpm. Exam Location:  [facility] Procedure: 2D Echo, Cardiac Doppler and Color Doppler Indications:     Atrial Fibrillation 427.31  History:         Patient has no prior history of Echocardiogram examinations.                  Alcohol abuse, sepsis.  Sonographer:      Sherrie Sport RDCS (AE) Referring Phys:  [place] Diagnosing Phys: Ida Rogue MD IMPRESSIONS  1. Left ventricular ejection fraction, by estimation, is 60 to 65%. The left ventricle has normal function. The left ventricle has no regional wall motion abnormalities. Left ventricular diastolic parameters were normal.  2. Right ventricular systolic function is normal. The right ventricular size is normal. There is mildly elevated pulmonary artery systolic pressure. The estimated right ventricular systolic pressure is 33.5 mmHg.  3. Left atrial size was mildly dilated.  4. Mild mitral valve regurgitation. FINDINGS  Left Ventricle: Left ventricular ejection fraction, by estimation, is 60 to 65%. The left ventricle has normal function. The left ventricle has no regional wall motion abnormalities. The left ventricular internal cavity size was normal in size. There is  no left ventricular hypertrophy. Left ventricular diastolic parameters were normal. Right Ventricle: The right ventricular size is normal. No increase in right ventricular wall thickness. Right ventricular systolic function is normal. There is mildly elevated pulmonary artery systolic pressure. The tricuspid regurgitant velocity is 2.96  m/s, and with an assumed right atrial pressure of 5 mmHg, the estimated right ventricular systolic pressure is 37.1 mmHg. Left Atrium: Left atrial size was mildly dilated. Right Atrium: Right atrial size was normal in size. Pericardium: There is no evidence of pericardial effusion. Mitral Valve: The mitral valve is normal in structure. Mild mitral valve regurgitation. No evidence of mitral valve stenosis. Tricuspid Valve: The tricuspid valve is normal in structure. Tricuspid valve regurgitation is mild . No evidence of tricuspid stenosis. Aortic Valve: The aortic valve is normal in structure. Aortic valve regurgitation is not visualized. No aortic stenosis is present. Aortic valve mean gradient measures  11.0 mmHg. Aortic valve peak gradient measures 19.0 mmHg. Aortic valve area, by VTI measures 3.09 cm. Pulmonic Valve: The pulmonic valve was normal in structure. Pulmonic valve regurgitation is not visualized. No evidence of pulmonic stenosis. Aorta: The aortic root is normal in size and structure. Venous: The inferior vena cava is normal in size with greater than 50% respiratory variability, suggesting right atrial pressure of 3 mmHg. IAS/Shunts: No atrial level shunt detected by color flow Doppler.  LEFT VENTRICLE PLAX 2D LVIDd:         4.83 cm  Diastology LVIDs:         3.13 cm  LV e' medial:    10.10 cm/s LV PW:         1.26 cm  LV E/e' medial:  12.1 LV IVS:        1.28 cm  LV e' lateral:   13.90 cm/s LVOT diam:     2.00 cm  LV E/e' lateral: 8.8 LV SV:         123 LV SV Index:   55 LVOT Area:     3.14 cm  LEFT ATRIUM              Index       RIGHT ATRIUM           Index LA diam:        4.70 cm  2.08 cm/m  RA Area:     23.30 cm LA Vol (A2C):   113.0 ml 50.08 ml/m RA Volume:   62.40 ml  27.66 ml/m LA Vol (A4C):   96.2 ml  42.64 ml/m LA Biplane Vol: 107.0 ml 47.42 ml/m  AORTIC VALVE                    PULMONIC VALVE AV Area (Vmax):    2.81 cm     PV Vmax:        0.92 m/s AV Area (Vmean):   2.69 cm     PV Peak grad:   3.4 mmHg AV Area (VTI):     3.09 cm     RVOT Peak grad: 6 mmHg AV Vmax:           218.00 cm/s AV Vmean:          153.000 cm/s AV VTI:            0.400 m AV Peak Grad:      19.0 mmHg AV Mean Grad:      11.0 mmHg LVOT Vmax:         195.00 cm/s LVOT  Vmean:        131.000 cm/s LVOT VTI:          0.393 m LVOT/AV VTI ratio: 0.98  AORTA Ao Root diam: 2.70 cm MITRAL VALVE                TRICUSPID VALVE MV Area (PHT): 3.23 cm     TR Peak grad:   35.0 mmHg MV Decel Time: 235 msec     TR Vmax:        296.00 cm/s MV E velocity: 122.00 cm/s MV A velocity: 73.70 cm/s   SHUNTS MV E/A ratio:  1.66         Systemic VTI:  0.39 m                             Systemic Diam: 2.00 cm Ida Rogue MD  Electronically signed by Ida Rogue MD Signature Date/Time: 02/08/2020/1:50:54 PM    Final    Medications: I have reviewed the patient's current medications. Scheduled Meds: . chlorhexidine gluconate (MEDLINE KIT)  15 mL Mouth Rinse BID  . Chlorhexidine Gluconate Cloth  6 each Topical Daily  . feeding supplement (ENSURE ENLIVE)  237 mL Oral TID BM  . folic acid  1 mg Oral Daily  . furosemide  40 mg Intravenous BID  . heparin  5,000 Units Subcutaneous Q8H  . lactulose  30 g Oral BID  . metoprolol tartrate  37.5 mg Oral BID  . multivitamin with minerals  1 tablet Oral Daily  . pantoprazole (PROTONIX) IV  40 mg Intravenous Q12H  . rifaximin  550 mg Oral BID  . sodium chloride flush  10-40 mL Intracatheter Q12H  . sodium chloride flush  10-40 mL Intracatheter Q12H  . thiamine  100 mg Oral Daily  . traMADol  50 mg Oral Q6H   Continuous Infusions: . sodium chloride Stopped (02/07/20 1335)  . sodium chloride    . ceFEPime (MAXIPIME) IV 2 g (02/09/20 1010)  . vancomycin 1,000 mg (02/09/20 0354)   PRN Meds:.sodium chloride, acetaminophen **OR** acetaminophen, haloperidol lactate, hydrOXYzine, LORazepam, ondansetron **OR** ondansetron (ZOFRAN) IV, sodium chloride flush, sodium chloride flush, traZODone   Assessment: Principal Problem:   Sepsis ([place]) Active Problems:   Acute blood loss anemia   GIB (gastrointestinal bleeding)   Transaminitis   Hyponatremia   Hypokalemia   Falls   Hip pain   Rhabdomyolysis   Hypovolemia   Hypotension   Alcohol abuse   Goals of care, counseling/discussion   Palliative care by specialist   PAF (paroxysmal atrial fibrillation) ([place])   Pure hypercholesterolemia    Plan: I have been asked to see this patient for liver disease. 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 The patient's treatment should include supportive care at  this time. The patient's liver will either improve as the remaining liver regenerates or will continue to decline resulting in fulminant hepatic failure and likely the patient's death. With a meld score showing only a 19% predicted 68-month mortality. The patient's liver enzymes besides the total bilirubin have improved.   LOS: 17 days   Lewayne Bunting 02/09/2020, 1:07 PM Pager [phone] 7am-5pm  Check AMION for 5pm -7am coverage and on weekends

## 2020-02-09 NOTE — Progress Notes (Signed)
Pharmacy Antibiotic Note  [name] is a 49 y.o. male admitted on 01/23/2020 with sepsis of unknown origin. Patient has PMH of alcoholic hepatitis, HLD, and HTN. Earlier in this admission, patient completed a 7 day course of cefepime and 5 day course of ceftriaxone for sepsis 9/6-9/13 while in the ICU and was transferred out of the ICU on 9/14. Patient WBC improved and began to increase again on 9/14 and also had intermittent low grade fevers. On 9/16 patient became septic again and had rapid Afib but converted back to NSR after receiving diltiazem infusion. CXR without signs of infection. Pharmacy has been consulted for Cefepime and Vancomycin dosing. Patient was started on Vancomycin IV 1g q8h with no loading dose.    Scr <1, stable WBC 13.3>13.2>12.0 Last fever 9/17 @0335  100.77F  Plan:  --Continue Cefepime 2 g IV q8hrs --Patient received first dose of Vancomycin maintenance dose 9/17 at 0844, additional Vancomycin 1500mg  IV x1 to load the patient given midday 9/17 --Continue Vancomycin 1 g IV q8hrs (per nomogram dosing) --Monitor renal function and adjust dose as clinically indicated --If vanc is continued, will obtain vanc level at steady state - ordered for 9/19 at 1100 (before 1200 dose)  Height: 5' 7.01" (170.2 cm) Weight: 108.7 kg (239 lb 10.2 oz) IBW/kg (Calculated) : 66.12  Temp (24hrs), Avg:98.6 F (37 C), Min:97.7 F (36.5 C), Max:99.4 F (37.4 C)  Recent Labs  Lab 02/05/20 0440 02/06/20 0646 02/07/20 0739 02/08/20 0545 02/09/20 0645  WBC 11.8* 15.0* 13.3* 13.2* 12.0*  CREATININE 0.91 0.86 0.68 0.60* 0.65  LATICACIDVEN  --   --   --  1.2  --     Estimated Creatinine Clearance: 131.3 mL/min (by C-G formula based on SCr of 0.65 mg/dL).    No Known Allergies  Antimicrobials this admission: Cefepime >> 9/6-9/13, 9/17>> CTX >> 9/1-9/6 Metronidazole 9/17>>9/18 Vancomycin  9/17>>   Microbiology results: 9/2 BCx: NG 9/17 Bcx: NGTD 9/6 MRSA PCR:  negative  Thank you for allowing pharmacy to be a part of this patients care.  10/17, PharmD, BCPS Clinical Pharmacist 02/09/2020 2:54 PM

## 2020-02-09 NOTE — Progress Notes (Addendum)
Progress Note  Patient Name: [name] Date of Encounter: 02/09/2020  [facility] Cardiologist: Ida Rogue, MD   Subjective   No acute events overnight, brother at bedside.  Occasional tremors, diaphoresis noted.  Inpatient Medications    Scheduled Meds: . chlorhexidine gluconate (MEDLINE KIT)  15 mL Mouth Rinse BID  . Chlorhexidine Gluconate Cloth  6 each Topical Daily  . feeding supplement (ENSURE ENLIVE)  237 mL Oral TID BM  . folic acid  1 mg Oral Daily  . furosemide  40 mg Intravenous BID  . heparin  5,000 Units Subcutaneous Q8H  . lactulose  30 g Oral BID  . metoprolol tartrate  37.5 mg Oral BID  . multivitamin with minerals  1 tablet Oral Daily  . pantoprazole (PROTONIX) IV  40 mg Intravenous Q12H  . rifaximin  550 mg Oral BID  . sodium chloride flush  10-40 mL Intracatheter Q12H  . sodium chloride flush  10-40 mL Intracatheter Q12H  . thiamine  100 mg Oral Daily  . traMADol  50 mg Oral Q6H   Continuous Infusions: . sodium chloride Stopped (02/07/20 1335)  . sodium chloride    . ceFEPime (MAXIPIME) IV 2 g (02/09/20 1010)  . magnesium sulfate bolus IVPB    . vancomycin 1,000 mg (02/09/20 0354)   PRN Meds: sodium chloride, acetaminophen **OR** acetaminophen, haloperidol lactate, hydrOXYzine, LORazepam, ondansetron **OR** ondansetron (ZOFRAN) IV, sodium chloride flush, sodium chloride flush, traZODone   Vital Signs    Vitals:   02/08/20 2100 02/09/20 0319 02/09/20 0801 02/09/20 0900  BP: (!) 141/64 140/82 135/76 135/76  Pulse: 76 80 75 80  Resp: (!) _0 Temp:  97.7 F (36.5 C) 99.4 F (37.4 C)   TempSrc:  Oral Oral   SpO2: 99% 91% 97%   Weight:  108.7 kg    Height:        Intake/Output Summary (Last 24 hours) at 02/09/2020 1112 Last data filed at 02/09/2020 0321 Gross per 24 hour  Intake 120 ml  Output 3900 ml  Net -3780 ml   Last 3 Weights 02/09/2020 02/08/2020 02/06/2020  Weight (lbs) 239 lb 10.2 oz 258 lb 12.8 oz 258 lb 9.6 oz   Weight (kg) 108.7 kg 117.391 kg 117.3 kg      Telemetry    Sinus rhythm- Personally Reviewed  ECG    No new tracing obtained- Personally Reviewed  Physical Exam   GEN:  Looks altered, mild tremors, speech not fluent Neck: No JVD Cardiac: RRR, no murmurs, rubs, or gallops.  Respiratory: Clear to auscultation bilaterally. GI: Soft, nontender, non-distended  MS: No edema; No deformity. Neuro:  Nonfocal, tremors Psych: Speech not fluent, appears altered  Labs    High Sensitivity Troponin:   Recent Labs  Lab 01/20/20 1654  TROPONINIHS 39*      Chemistry Recent Labs  Lab 02/07/20 0739 02/08/20 0545 02/09/20 0645  NA 143 143 141  K 3.6 3.4* 3.2*  CL 108 108 103  CO2 _1 GLUCOSE 92 101* 95  BUN 22* 23* 20  CREATININE 0.68 0.60* 0.65  CALCIUM 8.9 9.0 9.0  PROT 6.5 6.9 6.6  ALBUMIN 2.7*  2.7* 2.8* 2.8*  AST 154* 136* 107*  ALT 83* 82* 70*  ALKPHOS 46 52 50  BILITOT 9.1* 9.6* 10.0*  GFRNONAA >60 >60 >60  GFRAA >60  NOT CALCULATED >60 >60  ANIONGAP _2 Hematology Recent Labs  Lab 02/07/20 [phone]  02/08/20 0545 02/09/20 0645  WBC 13.3* 13.2* 12.0*  RBC 2.82* 3.12* 3.02*  HGB 9.6* 10.6* 10.5*  HCT 30.6* 32.7* 31.3*  MCV 108.5* 104.8* 103.6*  MCH 34.0 34.0 34.8*  MCHC 31.4 32.4 33.5  RDW 27.0* 27.3* 26.5*  PLT 177 167 143*    BNPNo results for input(s): BNP, PROBNP in the last 168 hours.   DDimer No results for input(s): DDIMER in the last 168 hours.   Radiology    CT CHEST WO CONTRAST  Result Date: 02/08/2020 CLINICAL DATA:  Pleural effusion EXAM: CT CHEST WITHOUT CONTRAST TECHNIQUE: Multidetector CT imaging of the chest was performed following the standard protocol without IV contrast. COMPARISON:  None. FINDINGS: Cardiovascular: Moderate multi-vessel coronary artery calcification. Global cardiac size within normal limits. No pericardial effusion. The central pulmonary arteries are enlarged in keeping with changes of pulmonary  arterial hypertension. The thoracic aorta is unremarkable save for bovine arch anatomy. Mediastinum/Nodes: No pathologic thoracic adenopathy. Soft tissue infiltration within the anterior mediastinum may represent rebound thymic hyperplasia in the setting of acute infection. No discrete anterior mediastinal mass identified. Esophagus unremarkable. Visualized thyroid unremarkable. Lungs/Pleura: There are scattered nodular infiltrates noted within the right upper lobe, lower lobes, and basilar lingula likely infectious or inflammatory in the acute setting. Small bilateral pleural effusions are present with associated mild bibasilar compressive atelectasis. No pneumothorax. Central airways are widely patent. Upper Abdomen: There is recanalization of the umbilical vein as well as gastroesophageal varices identified in keeping with changes of portal venous hypertension possibly related to hepatocellular disease. This is not well assessed on this examination. Musculoskeletal: No chest wall mass or suspicious bone lesions identified. IMPRESSION: 1. Multifocal pulmonary infiltrates most in keeping with acute atypical infection the appropriate clinical setting. 2. Moderate coronary artery calcification. Morphologic changes in keeping with pulmonary arterial hypertension. 3. Small bilateral pleural effusions. 4. Findings in keeping with portal venous hypertension, possibly related to underlying hepatocellular disease. Electronically Signed   By: Helyn Numbers MD   On: 02/08/2020 19:37   US Abdomen Limited  Result Date: 02/08/2020 CLINICAL DATA:  Ascites. EXAM: LIMITED ABDOMEN ULTRASOUND FOR ASCITES TECHNIQUE: Limited ultrasound survey for ascites was performed in all four abdominal quadrants. COMPARISON:  None. FINDINGS: No ascites is noted in any quadrant of the abdomen. IMPRESSION: No ascites is noted. Electronically Signed   By: Lupita Raider M.D.   On: 02/08/2020 14:00   DG Chest Port 1 View  Result Date:  02/08/2020 CLINICAL DATA:  Fever EXAM: PORTABLE CHEST 1 VIEW COMPARISON:  02/01/2020 FINDINGS: Cardiomegaly, vascular congestion. Bilateral hilar prominence, favor vascular. Diffuse interstitial prominence throughout the lungs, likely edema. No effusions. No acute bony abnormality. IMPRESSION: Cardiomegaly with vascular congestion and interstitial prominence, likely mild edema. Bilateral hilar prominence, favor vascular. Electronically Signed   By: Charlett Nose M.D.   On: 02/08/2020 05:23   ECHOCARDIOGRAM COMPLETE  Result Date: 02/08/2020    ECHOCARDIOGRAM REPORT   Patient Name:   [name] Date of Exam: 02/08/2020 Medical Rec #:  [id number]         Height:       67.0 in Accession #:    [id number]        Weight:       258.8 lb Date of Birth:  [birth year]        BSA:          2.256 m Patient Age:    48 years          BP:  136/77 mmHg Patient Gender: M                 HR:           77 bpm. Exam Location:  [facility] Procedure: 2D Echo, Cardiac Doppler and Color Doppler Indications:     Atrial Fibrillation 427.31  History:         Patient has no prior history of Echocardiogram examinations.                  Alcohol abuse, sepsis.  Sonographer:     Sherrie Sport RDCS (AE) Referring Phys:  [place] Diagnosing Phys: Ida Rogue MD IMPRESSIONS  1. Left ventricular ejection fraction, by estimation, is 60 to 65%. The left ventricle has normal function. The left ventricle has no regional wall motion abnormalities. Left ventricular diastolic parameters were normal.  2. Right ventricular systolic function is normal. The right ventricular size is normal. There is mildly elevated pulmonary artery systolic pressure. The estimated right ventricular systolic pressure is 15.9 mmHg.  3. Left atrial size was mildly dilated.  4. Mild mitral valve regurgitation. FINDINGS  Left Ventricle: Left ventricular ejection fraction, by estimation, is 60 to 65%. The left ventricle has normal function. The left  ventricle has no regional wall motion abnormalities. The left ventricular internal cavity size was normal in size. There is  no left ventricular hypertrophy. Left ventricular diastolic parameters were normal. Right Ventricle: The right ventricular size is normal. No increase in right ventricular wall thickness. Right ventricular systolic function is normal. There is mildly elevated pulmonary artery systolic pressure. The tricuspid regurgitant velocity is 2.96  m/s, and with an assumed right atrial pressure of 5 mmHg, the estimated right ventricular systolic pressure is 47.0 mmHg. Left Atrium: Left atrial size was mildly dilated. Right Atrium: Right atrial size was normal in size. Pericardium: There is no evidence of pericardial effusion. Mitral Valve: The mitral valve is normal in structure. Mild mitral valve regurgitation. No evidence of mitral valve stenosis. Tricuspid Valve: The tricuspid valve is normal in structure. Tricuspid valve regurgitation is mild . No evidence of tricuspid stenosis. Aortic Valve: The aortic valve is normal in structure. Aortic valve regurgitation is not visualized. No aortic stenosis is present. Aortic valve mean gradient measures 11.0 mmHg. Aortic valve peak gradient measures 19.0 mmHg. Aortic valve area, by VTI measures 3.09 cm. Pulmonic Valve: The pulmonic valve was normal in structure. Pulmonic valve regurgitation is not visualized. No evidence of pulmonic stenosis. Aorta: The aortic root is normal in size and structure. Venous: The inferior vena cava is normal in size with greater than 50% respiratory variability, suggesting right atrial pressure of 3 mmHg. IAS/Shunts: No atrial level shunt detected by color flow Doppler.  LEFT VENTRICLE PLAX 2D LVIDd:         4.83 cm  Diastology LVIDs:         3.13 cm  LV e' medial:    10.10 cm/s LV PW:         1.26 cm  LV E/e' medial:  12.1 LV IVS:        1.28 cm  LV e' lateral:   13.90 cm/s LVOT diam:     2.00 cm  LV E/e' lateral: 8.8 LV SV:          123 LV SV Index:   55 LVOT Area:     3.14 cm  LEFT ATRIUM  Index       RIGHT ATRIUM           Index LA diam:        4.70 cm  2.08 cm/m  RA Area:     23.30 cm LA Vol (A2C):   113.0 ml 50.08 ml/m RA Volume:   62.40 ml  27.66 ml/m LA Vol (A4C):   96.2 ml  42.64 ml/m LA Biplane Vol: 107.0 ml 47.42 ml/m  AORTIC VALVE                    PULMONIC VALVE AV Area (Vmax):    2.81 cm     PV Vmax:        0.92 m/s AV Area (Vmean):   2.69 cm     PV Peak grad:   3.4 mmHg AV Area (VTI):     3.09 cm     RVOT Peak grad: 6 mmHg AV Vmax:           218.00 cm/s AV Vmean:          153.000 cm/s AV VTI:            0.400 m AV Peak Grad:      19.0 mmHg AV Mean Grad:      11.0 mmHg LVOT Vmax:         195.00 cm/s LVOT Vmean:        131.000 cm/s LVOT VTI:          0.393 m LVOT/AV VTI ratio: 0.98  AORTA Ao Root diam: 2.70 cm MITRAL VALVE                TRICUSPID VALVE MV Area (PHT): 3.23 cm     TR Peak grad:   35.0 mmHg MV Decel Time: 235 msec     TR Vmax:        296.00 cm/s MV E velocity: 122.00 cm/s MV A velocity: 73.70 cm/s   SHUNTS MV E/A ratio:  1.66         Systemic VTI:  0.39 m                             Systemic Diam: 2.00 cm Julien Nordmann MD Electronically signed by Julien Nordmann MD Signature Date/Time: 02/08/2020/1:50:54 PM    Final     Cardiac Studies   Echo 01/2020 1. Left ventricular ejection fraction, by estimation, is 60 to 65%. The  left ventricle has normal function. The left ventricle has no regional  wall motion abnormalities. Left ventricular diastolic parameters were  normal.  2. Right ventricular systolic function is normal. The right ventricular  size is normal. There is mildly elevated pulmonary artery systolic  pressure. The estimated right ventricular systolic pressure is 40.0 mmHg.  3. Left atrial size was mildly dilated.  4. Mild mitral valve regurgitation.   Patient Profile     49 y.o. male with history of alcohol abuse presenting to the hospital after a fall and altered  mental status.  Found to be in respiratory failure requiring intubation.  Went into atrial fibrillation, treated with IV diltiazem with conversion to normal sinus rhythm on 9/16.  Being seen due to paroxysmal A. fib.  Assessment & Plan    1.  Paroxysmal atrial fibrillation -Currently in sinus rhythm -CHA2DS2-VASc of 0 -Continue Lopressor 37.5 mg twice daily. -EF shows normal systolic and diastolic function -Will not anticoagulate due to low CHA2DS2-VASc score, alcohol  abuse with risk of falls, anemia.  2.  EtOH abuse -On CIWA protocol per primary team -Cessation advised  3.  History of hyperlipidemia -PTA Lipitor held due to elevated lft's   No anticoagulation recommended as mentioned above.  Continue beta-blockers.  Discussed at length with brother at bedside.  Continue management of alcohol abuse/withdrawal as per primary team.   Total encounter time 35 minutes  Greater than 50% was spent in counseling and coordination of care with the patient     Signed, Kate Sable, MD  02/09/2020, 11:12 AM

## 2020-02-09 NOTE — Progress Notes (Signed)
  Chaplain On-Call was paged by Unit Lavetta Nielsen who reported that the patient's brother requested to speak with a Chaplain. Chaplain met brother [name] and wife Laddie Aquas seated on bench in hallway, and accompanied them to the [facility] for conversation.  [name] stated to this Chaplain that while he is the Medical illustrator for the patient, he wants to be sure that he will be the Agent as [place] also. He stated that he has had prior discussions with other Chaplains about Advance Directives documents, but the patient's grave condition has precluded completion.  Chaplain provided education about the Living Will and the [facility] documents. [name] will follow up with the next on-call Chaplain for possible completion if witnesses and a [employer] can be available.  Chaplain and [name] spoke also with RN Amy on Unit 2[address]A, who confirmed that an order for a consult with a Social Worker is in place for Monday, September 20. [name] stated his relief about that news, because he has many questions regarding potential discharge needs for the patient.  This Chaplain reassured [name] of the availability of Chaplains as needed.  [place] Malcolm Hetz M.Div., [facility]

## 2020-02-10 LAB — GLUCOSE, CAPILLARY
Glucose-Capillary: 121 mg/dL — ABNORMAL HIGH (ref 70–99)
Glucose-Capillary: 86 mg/dL (ref 70–99)
Glucose-Capillary: 89 mg/dL (ref 70–99)
Glucose-Capillary: 94 mg/dL (ref 70–99)
Glucose-Capillary: 98 mg/dL (ref 70–99)
Glucose-Capillary: 98 mg/dL (ref 70–99)

## 2020-02-10 LAB — COMPREHENSIVE METABOLIC PANEL
ALT: 63 U/L — ABNORMAL HIGH (ref 0–44)
AST: 97 U/L — ABNORMAL HIGH (ref 15–41)
Albumin: 2.7 g/dL — ABNORMAL LOW (ref 3.5–5.0)
Alkaline Phosphatase: 55 U/L (ref 38–126)
Anion gap: 10 (ref 5–15)
BUN: 19 mg/dL (ref 6–20)
CO2: 32 mmol/L (ref 22–32)
Calcium: 8.9 mg/dL (ref 8.9–10.3)
Chloride: 99 mmol/L (ref 98–111)
Creatinine, Ser: 0.52 mg/dL — ABNORMAL LOW (ref 0.61–1.24)
GFR calc Af Amer: >60 mL/min (ref >60)
GFR calc non Af Amer: >60 mL/min (ref >60)
Glucose, Bld: 89 mg/dL (ref 70–99)
Potassium: 3 mmol/L — ABNORMAL LOW (ref 3.5–5.1)
Sodium: 141 mmol/L (ref 135–145)
Total Bilirubin: 8.4 mg/dL — ABNORMAL HIGH (ref 0.3–1.2)
Total Protein: 6.4 g/dL — ABNORMAL LOW (ref 6.5–8.1)

## 2020-02-10 LAB — CBC
HCT: 32.1 % — ABNORMAL LOW (ref 39.0–52.0)
Hemoglobin: 10.4 g/dL — ABNORMAL LOW (ref 13.0–17.0)
MCH: 34.6 pg — ABNORMAL HIGH (ref 26.0–34.0)
MCHC: 32.4 g/dL (ref 30.0–36.0)
MCV: 106.6 fL — ABNORMAL HIGH (ref 80.0–100.0)
Platelets: 138 10*3/uL — ABNORMAL LOW (ref 150–400)
RBC: 3.01 MIL/uL — ABNORMAL LOW (ref 4.22–5.81)
RDW: 26.2 % — ABNORMAL HIGH (ref 11.5–15.5)
WBC: 12.9 10*3/uL — ABNORMAL HIGH (ref 4.0–10.5)
nRBC: 0.2 % (ref 0.0–0.2)

## 2020-02-10 LAB — MAGNESIUM: Magnesium: 1.4 mg/dL — ABNORMAL LOW (ref 1.7–2.4)

## 2020-02-10 LAB — PHOSPHORUS: Phosphorus: 3.6 mg/dL (ref 2.5–4.6)

## 2020-02-10 MED ORDER — POTASSIUM CHLORIDE CRYS ER 20 MEQ PO TBCR: 60.0000 meq | EXTENDED_RELEASE_TABLET | Freq: Once | ORAL | Status: DC

## 2020-02-10 MED ORDER — FUROSEMIDE 40 MG PO TABS
40.0000 mg | ORAL_TABLET | Freq: Every day | ORAL | Status: DC
  Administered 2020-02-11: 40 mg via ORAL
  Filled 2020-02-10: qty 1

## 2020-02-10 MED ORDER — MAGNESIUM SULFATE 4 GM/100ML IV SOLN
4.0000 g | Freq: Once | INTRAVENOUS | Status: AC
  Administered 2020-02-10: 4 g via INTRAVENOUS
  Filled 2020-02-10: qty 100

## 2020-02-10 MED ORDER — POTASSIUM CHLORIDE CRYS ER 20 MEQ PO TBCR
60.0000 meq | EXTENDED_RELEASE_TABLET | Freq: Two times a day (BID) | ORAL | Status: AC
  Administered 2020-02-10 (×2): 60 meq via ORAL
  Filled 2020-02-10 (×2): qty 3

## 2020-02-10 NOTE — Progress Notes (Signed)
PROGRESS NOTE    [name]  YQI:[id number] DOB: 24-Apr-1971 DOA: 01/23/2020 PCP: Remi Haggard, FNP  Outpatient Specialists:     Brief Narrative:  [name] an 49 y.o.malewith a known history of polysubstance abuse including alcohol abuse who presented to the emergency room with acute onset of increased generalized weakness and amechanicalfall.Alcohol level 296 on admission. Patient was transferred to ICU on 6 September due to increased work of breathing and accessory muscle use. He was withdrawing from alcohol required intubation and mechanical ventilation.  Events: 9/6 transferred to ICU for increased WOB, using accessory muscles to breathe with liver failure 9/7 remains on vent, liver failure, PICC line placed, brother updated 9/8 remains encephalopathic, liver failure, resp failure 9/10 remains on ventilator. Long discussion and update with brother. Sedatives switch to Precedex and fentanyl, continue CIWA scale 9/11still with some agitation when sedation is lightened. Intolerance to SBT due to agitation. 9/12 extubated 9/13 hypoxia improving 9/14 more alert awake, DT's improving, transferred out of ICU 9/15 more alert, DTs continue to improve 9/16 more alert, DTs improving still. Overnight a fib with rvr and fever, cxr showing pulmonary congestion. Treated with IV lasix and diltiazem drip and broad spectrum abx, dilt trip weaned off after a few hours.  9/19 more awake and alert, DTs improving  Assessment & Plan:   Principal Problem:   Sepsis ([place]) Active Problems:   Acute blood loss anemia   GIB (gastrointestinal bleeding)   Transaminitis   Hyponatremia   Hypokalemia   Falls   Hip pain   Rhabdomyolysis   Hypovolemia   Hypotension   Alcohol abuse   Goals of care, counseling/discussion   Palliative care by specialist   PAF (paroxysmal atrial fibrillation) ([place])   Pure hypercholesterolemia  # new-onset atrial fibrillation w/ RVR -  occurred night of 9/16, in setting of fluid overload, poss infection. CXR with pulmonary congestion. Treated w/ iv lasix and dilt drip, dilt now off and has resumed home metoprolol, rate controlled appears to be in sinus. Edema much improved (though not clear if outs have been accurately recorded) - stop lasix 40 iv bid, start 40 qd - replete lytes as below - cont metoprolol - cardiology consulted, no changes advised  # Family request of transfer to [facility] - called [facility] transfer center and told due to covid surge they are not currently accepting outside transfers  # Fever - in setting of a fib w/ rvr. On 9/16. Now abated. ua unremarkable. CXR without signs infection but CT showing multifocal opacities. Blood cultures drawn and neg to date (has picc). Started on broad-spectrum abx. May be sequelae of DTs. No fluid on abd u/s, no sig effusion on chest CT - stop abx today (vanc/zosyn (9/17 > 9/19), flagyl 9/17-9/18  - f/u blood cultures  # Hypoxic hypercapnic respiratory failure due to multiorgan liver failure and aspirationi pneumonia. Now extubated. Mild tachypnea continues.  - has weaned to 1 L - incentive spirometry  # Acute alcohol hepatitis with hepatic encephalopathy - stable - cont lactulose and bid ppi - GI advises no further interventions, supportive care  # Electrolytes - k 3.0, will order 120 oral - mg 1.4, will order 4 g IV  # Acute diastolic heart failure - lasix as above  # Macrocytic anemia - H 10s, stable. B12/folate wnl  # Delirium tremens  - improving, s/p precedex - cont ativan prn - cont thiamine, lactulose, rifaximin. Cont bid lactulose  # Right gluteal hematoma - seen  by ortho this hospitalization, advised no intervention. With significant dependent bruising right leg. Improving - PVLs ordered by night team to eval for DVT, negative for DVT    DVT prophylaxis: heparin Code Status: full Family Communication: brother updated @  bedside Disposition Plan: needs SNF, has no insurance, care mgmt to look into charity outpt pt/ot. Has been declined by inpatient rehab   Consultants:   GI and ortho signed off   Procedures:    Antimicrobials:   s/p cefepime 7 days then ceftriaxone 5 days 9/6-9/13.\  Vanc/zosyn/flagyl 9/17 > 9/19  Lines: left picc  Subjective: Mildly tremulous, denies pain. No fever last 24 hours. Tolerating diet. Feels more energetic, able to converse better.  Objective: Vitals:   02/10/20 1050 02/10/20 1055 02/10/20 1100 02/10/20 1134  BP:   125/67   Pulse:   74   Resp: (!) 29 18 (!) 35 (!) 22  Temp:   99.8 F (37.7 C)   TempSrc:   Oral   SpO2:   96%   Weight:      Height:        Intake/Output Summary (Last 24 hours) at 02/10/2020 1340 Last data filed at 02/10/2020 1218 Gross per 24 hour  Intake 1651.64 ml  Output 5150 ml  Net -3498.36 ml   Filed Weights   02/08/20 0407 02/09/20 0319 02/10/20 0303  Weight: 117.4 kg 108.7 kg 101.2 kg    Examination:  General exam: mild tremor, NAD Respiratory system: poor effort, scattered rhonchi, Cardiovascular system: S1 & S2 heard, RRR. Mild systolic murmur Gastrointestinal system: abd moderately distended, non tender Central nervous system: Alert , oriented to self Extremities: Symmetric 5 x 5 power. Skin: echymoses dependent side of b/l LEs right worse than left. improving Psychiatry: not agitated    Data Reviewed:   CBC: Recent Labs  Lab 02/04/20 0430 02/04/20 0830 02/05/20 0440 02/05/20 0440 02/06/20 0646 02/07/20 0739 02/08/20 0545 02/09/20 0645 02/10/20 0437  WBC 10.0  --  11.8*   < > 15.0* 13.3* 13.2* 12.0* 12.9*  NEUTROABS 7.6  --  8.6*  --  10.9* 9.3* 9.0*  --   --   HGB 9.5*   < > 9.8*   < > 9.5* 9.6* 10.6* 10.5* 10.4*  HCT 29.0*   < > 31.8*   < > 30.2* 30.6* 32.7* 31.3* 32.1*  MCV 102.1*  --  107.1*   < > 107.9* 108.5* 104.8* 103.6* 106.6*  PLT 171  --  167   < > 191 177 167 143* 138*   < > = values in  this interval not displayed.   Basic Metabolic Panel: Recent Labs  Lab 02/06/20 0646 02/07/20 0739 02/08/20 0545 02/09/20 0645 02/10/20 0437  NA 145 143 143 141 141  K 3.7 3.6 3.4* 3.2* 3.0*  CL 110 108 108 103 99  CO2 _0 32  GLUCOSE 108* 92 101* 95 89  BUN 31* 22* 23* 20 19  CREATININE 0.86 0.68 0.60* 0.65 0.52*  CALCIUM 8.9 8.9 9.0 9.0 8.9  MG 1.8 1.6* 1.6* 1.5* 1.4*  PHOS 3.5  3.5 3.8  3.9 4.3 4.0 3.6   GFR: Estimated Creatinine Clearance: 126.5 mL/min (A) (by C-G formula based on SCr of 0.52 mg/dL (L)). Liver Function Tests: Recent Labs  Lab 02/06/20 0500 02/06/20 0500 02/06/20 0646 02/07/20 0739 02/08/20 0545 02/09/20 0645 02/10/20 0437  AST 145*  --   --  154* 136* 107* 97*  ALT 69*  --   --  83* 82* 70* 63*  ALKPHOS 53  --   --  46 52 50 55  BILITOT 8.5*  --   --  9.1* 9.6* 10.0* 8.4*  PROT 6.6  --   --  6.5 6.9 6.6 6.4*  ALBUMIN 2.7*   < > 2.7* 2.7*  2.7* 2.8* 2.8* 2.7*   < > = values in this interval not displayed.   No results for input(s): LIPASE, AMYLASE in the last 168 hours. No results for input(s): AMMONIA in the last 168 hours. Coagulation Profile: Recent Labs  Lab 02/08/20 0545  INR 1.5*   Cardiac Enzymes: No results for input(s): CKTOTAL, CKMB, CKMBINDEX, TROPONINI in the last 168 hours. BNP (last 3 results) No results for input(s): PROBNP in the last 8760 hours. HbA1C: No results for input(s): HGBA1C in the last 72 hours. CBG: Recent Labs  Lab 02/09/20 2152 02/09/20 2359 02/10/20 0304 02/10/20 0734 02/10/20 1120  GLUCAP 74 86 94 98 121*   Lipid Profile: No results for input(s): CHOL, HDL, LDLCALC, TRIG, CHOLHDL, LDLDIRECT in the last 72 hours. Thyroid Function Tests: Recent Labs    02/09/20 0645  TSH 3.676   Anemia Panel: No results for input(s): VITAMINB12, FOLATE, FERRITIN, TIBC, IRON, RETICCTPCT in the last 72 hours. Urine analysis:    Component Value Date/Time   COLORURINE AMBER (A) 02/08/2020 0455    APPEARANCEUR HAZY (A) 02/08/2020 0455   LABSPEC 1.026 02/08/2020 0455   PHURINE 5.0 02/08/2020 0455   GLUCOSEU NEGATIVE 02/08/2020 0455   HGBUR SMALL (A) 02/08/2020 0455   BILIRUBINUR NEGATIVE 02/08/2020 0455   [place] 02/08/2020 0455   PROTEINUR 30 (A) 02/08/2020 0455   NITRITE NEGATIVE 02/08/2020 0455   LEUKOCYTESUR NEGATIVE 02/08/2020 0455   Sepsis Labs: _0 (procalcitonin:4,lacticidven:4)  ) Recent Results (from the past 240 hour(s))  Culture, blood (x 2)     Status: None (Preliminary result)   Collection Time: 02/08/20  5:45 AM   Specimen: BLOOD  Result Value Ref Range Status   Specimen Description BLOOD LINE  Final   Special Requests   Final    BOTTLES DRAWN AEROBIC AND ANAEROBIC Blood Culture results may not be optimal due to an excessive volume of blood received in culture bottles   Culture   Final    NO GROWTH 2 DAYS Performed at [facility], [address], [place], [place] [address]    Report Status PENDING  Incomplete  Culture, blood (x 2)     Status: None (Preliminary result)   Collection Time: 02/08/20  7:16 AM   Specimen: BLOOD  Result Value Ref Range Status   Specimen Description BLOOD RIGHT ANTECUBITAL  Final   Special Requests   Final    BOTTLES DRAWN AEROBIC AND ANAEROBIC Blood Culture adequate volume   Culture   Final    NO GROWTH 2 DAYS Performed at [facility], [address], [place], [place] [address]    Report Status PENDING  Incomplete         Radiology Studies: CT CHEST WO CONTRAST  Result Date: 02/08/2020 CLINICAL DATA:  Pleural effusion EXAM: CT CHEST WITHOUT CONTRAST TECHNIQUE: Multidetector CT imaging of the chest was performed following the standard protocol without IV contrast. COMPARISON:  None. FINDINGS: Cardiovascular: Moderate multi-vessel coronary artery calcification. Global cardiac size within normal limits. No pericardial effusion. The central pulmonary arteries are enlarged in keeping  with changes of pulmonary arterial hypertension. The thoracic aorta is unremarkable save for bovine arch anatomy. Mediastinum/Nodes: No pathologic thoracic adenopathy.  Soft tissue infiltration within the anterior mediastinum may represent rebound thymic hyperplasia in the setting of acute infection. No discrete anterior mediastinal mass identified. Esophagus unremarkable. Visualized thyroid unremarkable. Lungs/Pleura: There are scattered nodular infiltrates noted within the right upper lobe, lower lobes, and basilar lingula likely infectious or inflammatory in the acute setting. Small bilateral pleural effusions are present with associated mild bibasilar compressive atelectasis. No pneumothorax. Central airways are widely patent. Upper Abdomen: There is recanalization of the umbilical vein as well as gastroesophageal varices identified in keeping with changes of portal venous hypertension possibly related to hepatocellular disease. This is not well assessed on this examination. Musculoskeletal: No chest wall mass or suspicious bone lesions identified. IMPRESSION: 1. Multifocal pulmonary infiltrates most in keeping with acute atypical infection the appropriate clinical setting. 2. Moderate coronary artery calcification. Morphologic changes in keeping with pulmonary arterial hypertension. 3. Small bilateral pleural effusions. 4. Findings in keeping with portal venous hypertension, possibly related to underlying hepatocellular disease. Electronically Signed   By: Fidela Salisbury MD   On: 02/08/2020 19:37   US Abdomen Limited  Result Date: 02/08/2020 CLINICAL DATA:  Ascites. EXAM: LIMITED ABDOMEN ULTRASOUND FOR ASCITES TECHNIQUE: Limited ultrasound survey for ascites was performed in all four abdominal quadrants. COMPARISON:  None. FINDINGS: No ascites is noted in any quadrant of the abdomen. IMPRESSION: No ascites is noted. Electronically Signed   By: Marijo Conception M.D.   On: 02/08/2020 14:00         Scheduled Meds: . chlorhexidine gluconate (MEDLINE KIT)  15 mL Mouth Rinse BID  . Chlorhexidine Gluconate Cloth  6 each Topical Daily  . feeding supplement (ENSURE ENLIVE)  237 mL Oral TID BM  . folic acid  1 mg Oral Daily  . [START ON 02/11/2020] furosemide  40 mg Oral Daily  . heparin  5,000 Units Subcutaneous Q8H  . lactulose  30 g Oral BID  . metoprolol tartrate  37.5 mg Oral BID  . multivitamin with minerals  1 tablet Oral Daily  . pantoprazole (PROTONIX) IV  40 mg Intravenous Q12H  . potassium chloride  60 mEq Oral Once  . rifaximin  550 mg Oral BID  . sodium chloride flush  10-40 mL Intracatheter Q12H  . sodium chloride flush  10-40 mL Intracatheter Q12H  . thiamine  100 mg Oral Daily  . traMADol  50 mg Oral Q6H   Continuous Infusions: . sodium chloride Stopped (02/10/20 0859)  . sodium chloride    . ceFEPime (MAXIPIME) IV Stopped (02/10/20 0848)  . magnesium sulfate bolus IVPB    . vancomycin Stopped (02/10/20 0720)     LOS: 18 days    Time spent: Bella Villa, MD [facility] Pager [phone]  If 7PM-7AM, please contact night-coverage [email or web address] Password [email or web address] 02/10/2020, 1:40 PM

## 2020-02-10 NOTE — Progress Notes (Signed)
Pharmacy Antibiotic Note  [name] is a 49 y.o. male admitted on 01/23/2020 with sepsis of unknown origin. Patient has PMH of alcoholic hepatitis, HLD, and HTN. Earlier in this admission, patient completed a 7 day course of cefepime and 5 day course of ceftriaxone for sepsis 9/6-9/13 while in the ICU and was transferred out of the ICU on 9/14. Patient WBC improved and began to increase again on 9/14 and also had intermittent low grade fevers. On 9/16 patient became septic again and had rapid Afib but converted back to NSR after receiving diltiazem infusion. CXR without signs of infection. Pharmacy has been consulted for Cefepime and Vancomycin dosing. Patient was started on Vancomycin IV 1g q8h with no loading dose.    Scr <1, stable WBC 13.3>13.2>12.0 Last fever 9/17 @0335  100.60F  Plan:  --Continue Cefepime 2 g IV q8hrs --Patient received first dose of Vancomycin maintenance dose 9/17 at 0844, additional Vancomycin 1500mg  IV x1 to load the patient given midday 9/17 --Continue Vancomycin 1 g IV q8hrs (per nomogram dosing) --Monitor renal function and adjust dose as clinically indicated --If vanc is continued, will obtain vanc level at steady state - ordered for 9/19 at 1300 (before 1400 dose)  Height: 5' 7.01" (170.2 cm) Weight: 101.2 kg (223 lb 3.2 oz) IBW/kg (Calculated) : 66.12  Temp (24hrs), Avg:99 F (37.2 C), Min:98.6 F (37 C), Max:99.4 F (37.4 C)  Recent Labs  Lab 02/06/20 0646 02/07/20 0739 02/08/20 0545 02/09/20 0645 02/10/20 0437  WBC 15.0* 13.3* 13.2* 12.0* 12.9*  CREATININE 0.86 0.68 0.60* 0.65 0.52*  LATICACIDVEN  --   --  1.2  --   --     Estimated Creatinine Clearance: 126.5 mL/min (A) (by C-G formula based on SCr of 0.52 mg/dL (L)).    No Known Allergies  Antimicrobials this admission: Cefepime >> 9/6-9/13, 9/17>> CTX >> 9/1-9/6 Metronidazole 9/17>>9/18 Vancomycin  9/17>>   Microbiology results: 9/2 BCx: NG 9/17 Bcx: NGTD 9/6 MRSA PCR:  negative  Thank you for allowing pharmacy to be a part of this patient's care.  10/17, PharmD, BCPS Clinical Pharmacist 02/10/2020 7:41 AM

## 2020-02-10 NOTE — Progress Notes (Addendum)
 Progress Note  Patient Name: [name] Date of Encounter: 02/10/2020  [facility] Cardiologist: Timothy Gollan, MD   Subjective   No acute events overnight, overall looks better compared to yesterday.  Inpatient Medications    Scheduled Meds: . chlorhexidine gluconate (MEDLINE KIT)  15 mL Mouth Rinse BID  . Chlorhexidine Gluconate Cloth  6 each Topical Daily  . feeding supplement (ENSURE ENLIVE)  237 mL Oral TID BM  . folic acid  1 mg Oral Daily  . furosemide  40 mg Intravenous BID  . heparin  5,000 Units Subcutaneous Q8H  . lactulose  30 g Oral BID  . metoprolol tartrate  37.5 mg Oral BID  . multivitamin with minerals  1 tablet Oral Daily  . pantoprazole (PROTONIX) IV  40 mg Intravenous Q12H  . rifaximin  550 mg Oral BID  . sodium chloride flush  10-40 mL Intracatheter Q12H  . sodium chloride flush  10-40 mL Intracatheter Q12H  . thiamine  100 mg Oral Daily  . traMADol  50 mg Oral Q6H   Continuous Infusions: . sodium chloride Stopped (02/10/20 0859)  . sodium chloride    . ceFEPime (MAXIPIME) IV Stopped (02/10/20 0848)  . vancomycin Stopped (02/10/20 0720)   PRN Meds: sodium chloride, acetaminophen **OR** acetaminophen, haloperidol lactate, hydrOXYzine, LORazepam, ondansetron **OR** ondansetron (ZOFRAN) IV, sodium chloride flush, sodium chloride flush, traZODone   Vital Signs    Vitals:   02/10/20 1050 02/10/20 1055 02/10/20 1100 02/10/20 1134  BP:   125/67   Pulse:   74   Resp: (!) 29 18 (!) 35 (!) 22  Temp:   99.8 F (37.7 C)   TempSrc:   Oral   SpO2:   96%   Weight:      Height:        Intake/Output Summary (Last 24 hours) at 02/10/2020 1243 Last data filed at 02/10/2020 1218 Gross per 24 hour  Intake 1651.64 ml  Output 6350 ml  Net -4698.36 ml   Last 3 Weights 02/10/2020 02/09/2020 02/08/2020  Weight (lbs) 223 lb 3.2 oz 239 lb 10.2 oz 258 lb 12.8 oz  Weight (kg) 101.243 kg 108.7 kg 117.391 kg      Telemetry    Sinus rhythm- Personally  Reviewed  ECG    No new tracing obtained- Personally Reviewed  Physical Exam   GEN:  Looks altered, mild tremors, speech not fluent Neck: No JVD Cardiac: RRR, no murmurs, rubs, or gallops.  Respiratory: Clear to auscultation bilaterally. GI: Soft, nontender, non-distended  MS: No edema; No deformity. Neuro:  Nonfocal, tremors Psych: Speech not fluent, appears altered  Labs    High Sensitivity Troponin:   Recent Labs  Lab 01/20/20 1654  TROPONINIHS 39*      Chemistry Recent Labs  Lab 02/08/20 0545 02/09/20 0645 02/10/20 0437  NA 143 141 141  K 3.4* 3.2* 3.0*  CL 108 103 99  CO2 25 28 32  GLUCOSE 101* 95 89  BUN 23* 20 19  CREATININE 0.60* 0.65 0.52*  CALCIUM 9.0 9.0 8.9  PROT 6.9 6.6 6.4*  ALBUMIN 2.8* 2.8* 2.7*  AST 136* 107* 97*  ALT 82* 70* 63*  ALKPHOS 52 50 55  BILITOT 9.6* 10.0* 8.4*  GFRNONAA >60 >60 >60  GFRAA >60 >60 >60  ANIONGAP 10 10 10     Hematology Recent Labs  Lab 02/08/20 0545 02/09/20 0645 02/10/20 0437  WBC 13.2* 12.0* 12.9*  RBC 3.12* 3.02* 3.01*  HGB 10.6* 10.5* 10.4*    HCT 32.7* 31.3* 32.1*  MCV 104.8* 103.6* 106.6*  MCH 34.0 34.8* 34.6*  MCHC 32.4 33.5 32.4  RDW 27.3* 26.5* 26.2*  PLT 167 143* 138*    BNPNo results for input(s): BNP, PROBNP in the last 168 hours.   DDimer No results for input(s): DDIMER in the last 168 hours.   Radiology    CT CHEST WO CONTRAST  Result Date: 02/08/2020 CLINICAL DATA:  Pleural effusion EXAM: CT CHEST WITHOUT CONTRAST TECHNIQUE: Multidetector CT imaging of the chest was performed following the standard protocol without IV contrast. COMPARISON:  None. FINDINGS: Cardiovascular: Moderate multi-vessel coronary artery calcification. Global cardiac size within normal limits. No pericardial effusion. The central pulmonary arteries are enlarged in keeping with changes of pulmonary arterial hypertension. The thoracic aorta is unremarkable save for bovine arch anatomy. Mediastinum/Nodes: No  pathologic thoracic adenopathy. Soft tissue infiltration within the anterior mediastinum may represent rebound thymic hyperplasia in the setting of acute infection. No discrete anterior mediastinal mass identified. Esophagus unremarkable. Visualized thyroid unremarkable. Lungs/Pleura: There are scattered nodular infiltrates noted within the right upper lobe, lower lobes, and basilar lingula likely infectious or inflammatory in the acute setting. Small bilateral pleural effusions are present with associated mild bibasilar compressive atelectasis. No pneumothorax. Central airways are widely patent. Upper Abdomen: There is recanalization of the umbilical vein as well as gastroesophageal varices identified in keeping with changes of portal venous hypertension possibly related to hepatocellular disease. This is not well assessed on this examination. Musculoskeletal: No chest wall mass or suspicious bone lesions identified. IMPRESSION: 1. Multifocal pulmonary infiltrates most in keeping with acute atypical infection the appropriate clinical setting. 2. Moderate coronary artery calcification. Morphologic changes in keeping with pulmonary arterial hypertension. 3. Small bilateral pleural effusions. 4. Findings in keeping with portal venous hypertension, possibly related to underlying hepatocellular disease. Electronically Signed   By: Ashesh  Parikh MD   On: 02/08/2020 19:37   US Abdomen Limited  Result Date: 02/08/2020 CLINICAL DATA:  Ascites. EXAM: LIMITED ABDOMEN ULTRASOUND FOR ASCITES TECHNIQUE: Limited ultrasound survey for ascites was performed in all four abdominal quadrants. COMPARISON:  None. FINDINGS: No ascites is noted in any quadrant of the abdomen. IMPRESSION: No ascites is noted. Electronically Signed   By: James  Green Jr M.D.   On: 02/08/2020 14:00   ECHOCARDIOGRAM COMPLETE  Result Date: 02/08/2020    ECHOCARDIOGRAM REPORT   Patient Name:   Cash TODD Kolton Date of Exam: 02/08/2020 Medical Rec #:   [id number]         Height:       67.0 in Accession #:    [id number]        Weight:       258.8 lb Date of Birth:  [birth year]        BSA:          2.256 m Patient Age:    48 years          BP:           136/77 mmHg Patient Gender: M                 HR:           77 bpm. Exam Location:  [facility] Procedure: 2D Echo, Cardiac Doppler and Color Doppler Indications:     Atrial Fibrillation 427.31  History:         Patient has no prior history of Echocardiogram examinations.                    Alcohol abuse, sepsis.  Sonographer:     Sherrie Sport RDCS (AE) Referring Phys:  [place] Diagnosing Phys: Ida Rogue MD IMPRESSIONS  1. Left ventricular ejection fraction, by estimation, is 60 to 65%. The left ventricle has normal function. The left ventricle has no regional wall motion abnormalities. Left ventricular diastolic parameters were normal.  2. Right ventricular systolic function is normal. The right ventricular size is normal. There is mildly elevated pulmonary artery systolic pressure. The estimated right ventricular systolic pressure is 31.5 mmHg.  3. Left atrial size was mildly dilated.  4. Mild mitral valve regurgitation. FINDINGS  Left Ventricle: Left ventricular ejection fraction, by estimation, is 60 to 65%. The left ventricle has normal function. The left ventricle has no regional wall motion abnormalities. The left ventricular internal cavity size was normal in size. There is  no left ventricular hypertrophy. Left ventricular diastolic parameters were normal. Right Ventricle: The right ventricular size is normal. No increase in right ventricular wall thickness. Right ventricular systolic function is normal. There is mildly elevated pulmonary artery systolic pressure. The tricuspid regurgitant velocity is 2.96  m/s, and with an assumed right atrial pressure of 5 mmHg, the estimated right ventricular systolic pressure is 94.5 mmHg. Left Atrium: Left atrial size was mildly dilated. Right Atrium:  Right atrial size was normal in size. Pericardium: There is no evidence of pericardial effusion. Mitral Valve: The mitral valve is normal in structure. Mild mitral valve regurgitation. No evidence of mitral valve stenosis. Tricuspid Valve: The tricuspid valve is normal in structure. Tricuspid valve regurgitation is mild . No evidence of tricuspid stenosis. Aortic Valve: The aortic valve is normal in structure. Aortic valve regurgitation is not visualized. No aortic stenosis is present. Aortic valve mean gradient measures 11.0 mmHg. Aortic valve peak gradient measures 19.0 mmHg. Aortic valve area, by VTI measures 3.09 cm. Pulmonic Valve: The pulmonic valve was normal in structure. Pulmonic valve regurgitation is not visualized. No evidence of pulmonic stenosis. Aorta: The aortic root is normal in size and structure. Venous: The inferior vena cava is normal in size with greater than 50% respiratory variability, suggesting right atrial pressure of 3 mmHg. IAS/Shunts: No atrial level shunt detected by color flow Doppler.  LEFT VENTRICLE PLAX 2D LVIDd:         4.83 cm  Diastology LVIDs:         3.13 cm  LV e' medial:    10.10 cm/s LV PW:         1.26 cm  LV E/e' medial:  12.1 LV IVS:        1.28 cm  LV e' lateral:   13.90 cm/s LVOT diam:     2.00 cm  LV E/e' lateral: 8.8 LV SV:         123 LV SV Index:   55 LVOT Area:     3.14 cm  LEFT ATRIUM              Index       RIGHT ATRIUM           Index LA diam:        4.70 cm  2.08 cm/m  RA Area:     23.30 cm LA Vol (A2C):   113.0 ml 50.08 ml/m RA Volume:   62.40 ml  27.66 ml/m LA Vol (A4C):   96.2 ml  42.64 ml/m LA Biplane Vol: 107.0 ml 47.42 ml/m  AORTIC VALVE  PULMONIC VALVE AV Area (Vmax):    2.81 cm     PV Vmax:        0.92 m/s AV Area (Vmean):   2.69 cm     PV Peak grad:   3.4 mmHg AV Area (VTI):     3.09 cm     RVOT Peak grad: 6 mmHg AV Vmax:           218.00 cm/s AV Vmean:          153.000 cm/s AV VTI:            0.400 m AV Peak Grad:       19.0 mmHg AV Mean Grad:      11.0 mmHg LVOT Vmax:         195.00 cm/s LVOT Vmean:        131.000 cm/s LVOT VTI:          0.393 m LVOT/AV VTI ratio: 0.98  AORTA Ao Root diam: 2.70 cm MITRAL VALVE                TRICUSPID VALVE MV Area (PHT): 3.23 cm     TR Peak grad:   35.0 mmHg MV Decel Time: 235 msec     TR Vmax:        296.00 cm/s MV E velocity: 122.00 cm/s MV A velocity: 73.70 cm/s   SHUNTS MV E/A ratio:  1.66         Systemic VTI:  0.39 m                             Systemic Diam: 2.00 cm Ida Rogue MD Electronically signed by Ida Rogue MD Signature Date/Time: 02/08/2020/1:50:54 PM    Final     Cardiac Studies   Echo 01/2020 1. Left ventricular ejection fraction, by estimation, is 60 to 65%. The  left ventricle has normal function. The left ventricle has no regional  wall motion abnormalities. Left ventricular diastolic parameters were  normal.  2. Right ventricular systolic function is normal. The right ventricular  size is normal. There is mildly elevated pulmonary artery systolic  pressure. The estimated right ventricular systolic pressure is 63.8 mmHg.  3. Left atrial size was mildly dilated.  4. Mild mitral valve regurgitation.   Patient Profile     49 y.o. male with history of alcohol abuse presenting to the hospital after a fall and altered mental status.  Found to be in respiratory failure requiring intubation.  Went into atrial fibrillation, treated with IV diltiazem with conversion to normal sinus rhythm on 9/16.  Being seen due to paroxysmal A. fib.  Assessment & Plan    1.  Paroxysmal atrial fibrillation -Currently in sinus rhythm -CHA2DS2-VASc of 0 -Continue Lopressor 37.5 mg twice daily. -EF shows normal systolic and diastolic function -No plan for anticoagulation due to low CHA2DS2-VASc score, alcohol abuse with risk of falls, anemia.  2.  EtOH abuse -On CIWA protocol per primary team  3.  History of hyperlipidemia -PTA Lipitor held due to elevated  lft's, likely alcoholic hepatitis.   Plan Discussed at length with family at bedside.  Continue management of alcohol abuse/withdrawal as per primary team.  No additional cardiac input or testing indicated or advised at this point.  Continue beta-blocker as above.  No plans for anticoagulation as mentioned above.  Cardiology will sign off.  Please let us know if additional input is needed.  Thank you for  this consult.   Total encounter time 35 minutes  Greater than 50% was spent in counseling and coordination of care with the patient     Signed,  Agbor-Etang, MD  02/10/2020, 12:43 PM    

## 2020-02-10 NOTE — Progress Notes (Signed)
Cody Lame, MD [facility]   [address] [place], [place] [address] Phone: [phone] Fax : [phone]   Subjective: The patient is more alert today and able to answer questions.  He has had improvement in his bilirubin.  The patient reports some continued hip and right leg pain.  The patient's sister laws with him today.   Objective: Vital signs in last 24 hours: Vitals:   02/10/20 1505 02/10/20 1510 02/10/20 1515 02/10/20 1520  BP:   (!) 163/77   Pulse: 69 68 71 71  Resp: (!) 22 (!) 26 20 (!) 38  Temp:   99.1 F (37.3 C)   TempSrc:   Oral   SpO2: 95% 95% 95% 95%  Weight:      Height:       Weight change: -7.457 kg  Intake/Output Summary (Last 24 hours) at 02/10/2020 1608 Last data filed at 02/10/2020 1218 Gross per 24 hour  Intake 1651.64 ml  Output 5150 ml  Net -3498.36 ml     Exam: Heart:: Regular rate and rhythm, S1S2 present or without murmur or extra heart sounds Lungs: normal and clear to auscultation and percussion Abdomen: soft, nontender, normal bowel sounds   Lab Results: $RemoveBefo'@[email or web address]'[id number]$ @ Micro Results: Recent Results (from the past 240 hour(s))  Culture, blood (x 2)     Status: None (Preliminary result)   Collection Time: 02/08/20  5:45 AM   Specimen: BLOOD  Result Value Ref Range Status   Specimen Description BLOOD LINE  Final   Special Requests   Final    BOTTLES DRAWN AEROBIC AND ANAEROBIC Blood Culture results may not be optimal due to an excessive volume of blood received in culture bottles   Culture   Final    NO GROWTH 2 DAYS Performed at [facility], [address] [place], [place] [address]    Report Status PENDING  Incomplete  Culture, blood (x 2)     Status: None (Preliminary result)   Collection Time: 02/08/20  7:16 AM   Specimen: BLOOD  Result Value Ref Range Status   Specimen Description BLOOD RIGHT ANTECUBITAL  Final   Special Requests   Final    BOTTLES DRAWN AEROBIC AND ANAEROBIC Blood Culture adequate volume    Culture   Final    NO GROWTH 2 DAYS Performed at [facility], [address] [place], [place] [address]    Report Status PENDING  Incomplete   Studies/Results: CT CHEST WO CONTRAST  Result Date: 02/08/2020 CLINICAL DATA:  Pleural effusion EXAM: CT CHEST WITHOUT CONTRAST TECHNIQUE: Multidetector CT imaging of the chest was performed following the standard protocol without IV contrast. COMPARISON:  None. FINDINGS: Cardiovascular: Moderate multi-vessel coronary artery calcification. Global cardiac size within normal limits. No pericardial effusion. The central pulmonary arteries are enlarged in keeping with changes of pulmonary arterial hypertension. The thoracic aorta is unremarkable save for bovine arch anatomy. Mediastinum/Nodes: No pathologic thoracic adenopathy. Soft tissue infiltration within the anterior mediastinum may represent rebound thymic hyperplasia in the setting of acute infection. No discrete anterior mediastinal mass identified. Esophagus unremarkable. Visualized thyroid unremarkable. Lungs/Pleura: There are scattered nodular infiltrates noted within the right upper lobe, lower lobes, and basilar lingula likely infectious or inflammatory in the acute setting. Small bilateral pleural effusions are present with associated mild bibasilar compressive atelectasis. No pneumothorax. Central airways are widely patent. Upper Abdomen: There is recanalization of the umbilical vein as well as gastroesophageal varices identified in keeping with changes of portal venous hypertension possibly related  to hepatocellular disease. This is not well assessed on this examination. Musculoskeletal: No chest wall mass or suspicious bone lesions identified. IMPRESSION: 1. Multifocal pulmonary infiltrates most in keeping with acute atypical infection the appropriate clinical setting. 2. Moderate coronary artery calcification. Morphologic changes in keeping with pulmonary arterial hypertension. 3. Small  bilateral pleural effusions. 4. Findings in keeping with portal venous hypertension, possibly related to underlying hepatocellular disease. Electronically Signed   By: Fidela Salisbury MD   On: 02/08/2020 19:37   Medications: I have reviewed the patient's current medications. Scheduled Meds: . chlorhexidine gluconate (MEDLINE KIT)  15 mL Mouth Rinse BID  . Chlorhexidine Gluconate Cloth  6 each Topical Daily  . feeding supplement (ENSURE ENLIVE)  237 mL Oral TID BM  . folic acid  1 mg Oral Daily  . [START ON 02/11/2020] furosemide  40 mg Oral Daily  . heparin  5,000 Units Subcutaneous Q8H  . lactulose  30 g Oral BID  . metoprolol tartrate  37.5 mg Oral BID  . multivitamin with minerals  1 tablet Oral Daily  . pantoprazole (PROTONIX) IV  40 mg Intravenous Q12H  . potassium chloride  60 mEq Oral BID  . rifaximin  550 mg Oral BID  . sodium chloride flush  10-40 mL Intracatheter Q12H  . sodium chloride flush  10-40 mL Intracatheter Q12H  . thiamine  100 mg Oral Daily  . traMADol  50 mg Oral Q6H   Continuous Infusions: . sodium chloride Stopped (02/10/20 0859)  . sodium chloride    . magnesium sulfate bolus IVPB 4 g (02/10/20 1506)   PRN Meds:.sodium chloride, acetaminophen **OR** acetaminophen, haloperidol lactate, hydrOXYzine, LORazepam, ondansetron **OR** ondansetron (ZOFRAN) IV, sodium chloride flush, sodium chloride flush, traZODone   Assessment: Principal Problem:   Sepsis ([place]) Active Problems:   Acute blood loss anemia   GIB (gastrointestinal bleeding)   Transaminitis   Hyponatremia   Hypokalemia   Falls   Hip pain   Rhabdomyolysis   Hypovolemia   Hypotension   Alcohol abuse   Goals of care, counseling/discussion   Palliative care by specialist   PAF (paroxysmal atrial fibrillation) ([place])   Pure hypercholesterolemia    Plan: This patient has chronic liver disease with acute alcoholic hepatitis.  Patient's labs have been coming down.  There is nothing further to do  from a GI point of view and I would recommend continued supportive care for his other medical issues.  His liver may take up to 3 months to reach its maximum point of improvement.  The patient has been told to avoid alcohol use.  I will sign off.  Please call if any further GI concerns or questions.  We would like to thank you for the opportunity to participate in the care of [name].     LOS: 18 days   [name], Marval Regal 02/10/2020, 4:08 PM Pager [phone] 7am-5pm  Check AMION for 5pm -7am coverage and on weekends

## 2020-02-11 LAB — CBC
HCT: 33.3 % — ABNORMAL LOW (ref 39.0–52.0)
Hemoglobin: 11.1 g/dL — ABNORMAL LOW (ref 13.0–17.0)
MCH: 34.5 pg — ABNORMAL HIGH (ref 26.0–34.0)
MCHC: 33.3 g/dL (ref 30.0–36.0)
MCV: 103.4 fL — ABNORMAL HIGH (ref 80.0–100.0)
Platelets: 134 10*3/uL — ABNORMAL LOW (ref 150–400)
RBC: 3.22 MIL/uL — ABNORMAL LOW (ref 4.22–5.81)
RDW: 25.5 % — ABNORMAL HIGH (ref 11.5–15.5)
WBC: 11.8 10*3/uL — ABNORMAL HIGH (ref 4.0–10.5)
nRBC: 0 % (ref 0.0–0.2)

## 2020-02-11 LAB — COMPREHENSIVE METABOLIC PANEL
ALT: 57 U/L — ABNORMAL HIGH (ref 0–44)
AST: 89 U/L — ABNORMAL HIGH (ref 15–41)
Albumin: 2.8 g/dL — ABNORMAL LOW (ref 3.5–5.0)
Alkaline Phosphatase: 57 U/L (ref 38–126)
Anion gap: 8 (ref 5–15)
BUN: 19 mg/dL (ref 6–20)
CO2: 34 mmol/L — ABNORMAL HIGH (ref 22–32)
Calcium: 9.2 mg/dL (ref 8.9–10.3)
Chloride: 98 mmol/L (ref 98–111)
Creatinine, Ser: 0.72 mg/dL (ref 0.61–1.24)
GFR calc Af Amer: >60 mL/min (ref >60)
GFR calc non Af Amer: >60 mL/min (ref >60)
Glucose, Bld: 92 mg/dL (ref 70–99)
Potassium: 3.5 mmol/L (ref 3.5–5.1)
Sodium: 140 mmol/L (ref 135–145)
Total Bilirubin: 8.5 mg/dL — ABNORMAL HIGH (ref 0.3–1.2)
Total Protein: 6.5 g/dL (ref 6.5–8.1)

## 2020-02-11 LAB — MAGNESIUM: Magnesium: 1.7 mg/dL (ref 1.7–2.4)

## 2020-02-11 MED ORDER — IBUPROFEN 400 MG PO TABS
600.0000 mg | ORAL_TABLET | Freq: Four times a day (QID) | ORAL | Status: DC | PRN
  Administered 2020-02-12 – 2020-02-13 (×2): 600 mg via ORAL
  Filled 2020-02-11 (×2): qty 2

## 2020-02-11 MED ORDER — POTASSIUM CHLORIDE CRYS ER 20 MEQ PO TBCR
60.0000 meq | EXTENDED_RELEASE_TABLET | Freq: Once | ORAL | Status: AC
  Administered 2020-02-11: 60 meq via ORAL
  Filled 2020-02-11: qty 3

## 2020-02-11 MED ORDER — FUROSEMIDE 20 MG PO TABS
20.0000 mg | ORAL_TABLET | Freq: Every day | ORAL | Status: DC
  Administered 2020-02-12: 20 mg via ORAL
  Filled 2020-02-11: qty 1

## 2020-02-11 MED ORDER — OXYCODONE HCL 5 MG PO TABS
2.5000 mg | ORAL_TABLET | Freq: Four times a day (QID) | ORAL | Status: DC | PRN
  Filled 2020-02-11: qty 1

## 2020-02-11 NOTE — Progress Notes (Signed)
OT Cancellation Note  Patient Details Name: [name] MRN: [id number] DOB: 10/14/1970   Cancelled Treatment:    Reason Eval/Treat Not Completed: Patient declined, no reason specified   OTR attempted to engage pt in OT treatment session, but pt declined.  He reports just participating in self care related tasks and is fatigued. OTR educated pt on importance of OOB mobility for functional strengthening and improved endurance, but pt unwilling to participate.  Will continue to follow up at next opportunity.  Kathyrn Drown Areana Kosanke, OTR/L 02/11/20, 2:33 PM

## 2020-02-11 NOTE — Evaluation (Addendum)
Clinical/Bedside Swallow Evaluation Patient Details  Name: [name] MRN: [id number] Date of Birth: March 24, 1971  Today's Date: 02/11/2020 Time: SLP Start Time (ACUTE ONLY): 1145 SLP Stop Time (ACUTE ONLY): 1240 SLP Time Calculation (min) (ACUTE ONLY): 55 min  Past Medical History:  Past Medical History:  Diagnosis Date  . Alcohol abuse   . Anemia    a. 01/2020 s/p fall w/ significant R hip hematoma-->4 u prbcs during admission.  . Delirium tremens (HCC) 01/2020  . Hepatic failure (HCC)    a. 01/2020 - ETOH/sepsis/rhabdo  . Polysubstance abuse (HCC)   . Respiratory failure (HCC)    a. 01/2020 in setting of DTs/Sepsis req intubation.  . Rhabdomyolysis 01/2020  . Sepsis (HCC) 01/2020   Past Surgical History: History reviewed. No pertinent surgical history. HPI:  Pt is a 49 y.o. Caucasian male admitted w/ Acute symptomatic blood loss anemia secondary to GI bleeding and Alcoholic hepatitis with early hepatic cell failure and current alcohol intoxication w/ a known history of polysubstance abuse including alcohol abuse who presented to the emergency room with acute onset of increased generalized weakness and a mechanical fall. He believes that he lost balance and fell with subsequent right hip pain he usually drinks 5-6 beer at night. His last drink was earlier today. He has bruising over his right medial arm as well as right medial and lateral thigh and left buttock and thigh. He has been taking Advil and Tylenol for pain and leftover Percocet.  Post admit, he was transferred to ICU for increased WOB, using accessory muscles to breathe with liver failure; he was orally intubated, extubated ~5 days later while DTs continued to improve; some continued confusion currently.    Assessment / Plan / Recommendation Clinical Impression  Pt appears to present w/ adequate oropharyngeal phase swallow w/ No overt oropharyngeal phase dysphagia noted, No neuromuscular deficits/weakness noted.Pt  consumed po trials w/ No overt, clinical s/s of aspiration during po trials. He has current min Cognitive decline(improving per NSG/MD) d/t recent illness/DTs form ETOH abuse, which can increase risk for dysphagia, but he appears at reduced risk for aspiration following general aspiration precautions and w/ setup/monitoring during po's. In preparation for po trials, family member(present) and pt needed education on the importance of sitting Fully upright for all eating/drinking. After positioning, pt consumed all consistencies w/ no overt coughing, decline in vocal quality, or change in respiratory presentation during/post trials. Oral phase appeared grossly [facility] w/ timely bolus management, mastication, and control of bolus propulsion for A-P transfer for swallowing. Oral clearing achieved w/ all trial consistencies as pt alternated foods/liquids during trials. OM Exam appeared [facility] w/ no unilateral weakness noted. Speech Clear. Pt held Cup to drink. Recommend a upgrade to Regular consistency diet w/ well-Cut meats, moistened foods; Thin liquids. Recommend general aspiration precautions, general REFLUX precautions, Pills WHOLE in Puree for safer, easier swallowing as needed. Education given on Pills in Puree if needed; food consistencies and easy to eat options; general aspiration precautions. NSG to reconsult if any new swallowing needs arise. If pt needs any f/u or assessment for Cognitive decline at discharge, recommend Outpatient services for formal assessment to address cognitive-linguistic abilities in ADLs. Pt/NSG agreed.  SLP Visit Diagnosis: Dysphagia, unspecified (R13.10)    Aspiration Risk   (reduced following general precautions)    Diet Recommendation  Regular diet w/ Cut meats, moistened foods; Thin liquids. General aspiration and Reflux precautions  Medication Administration: Whole meds with puree (as needed fo easier swallowing)  Other  Recommendations Recommended Consults:  (GI  following) Oral Care Recommendations: Oral care BID;Oral care before and after PO;Patient independent with oral care Other Recommendations:  (n/a)   Follow up Recommendations None      Frequency and Duration  (n/a)   (n/a)       Prognosis Prognosis for Safe Diet Advancement: Good Barriers to Reach Goals: Behavior (currently)      Swallow Study   General Date of Onset: 01/23/20 HPI: Pt is a 49 y.o. Caucasian male admitted w/ Acute symptomatic blood loss anemia secondary to GI bleeding and Alcoholic hepatitis with early hepatic cell failure and current alcohol intoxication w/ a known history of polysubstance abuse including alcohol abuse who presented to the emergency room with acute onset of increased generalized weakness and a mechanical fall. He believes that he lost balance and fell with subsequent right hip pain he usually drinks 5-6 beer at night. His last drink was earlier today. He has bruising over his right medial arm as well as right medial and lateral thigh and left buttock and thigh. He has been taking Advil and Tylenol for pain and leftover Percocet.  Post admit, he was transferred to ICU for increased WOB, using accessory muscles to breathe with liver failure; he was orally intubated, extubated ~5 days later while DTs continued to improve; some continued confusion currently.  Type of Study: Bedside Swallow Evaluation Previous Swallow Assessment: none reported Diet Prior to this Study: Thin liquids (Full Liquid diet per MD d/t GI issues) Temperature Spikes Noted: No (wbc 11.8) Respiratory Status: Room air (not on [place] during this eval) History of Recent Intubation: Yes Length of Intubations (days): 5 days Date extubated: 02/03/20 Behavior/Cognition: Alert;Cooperative;Pleasant mood;Confused;Distractible;Requires cueing Oral Cavity Assessment: Within Functional Limits Oral Care Completed by SLP: Recent completion by staff Oral Cavity - Dentition: Adequate natural  dentition Vision: Functional for self-feeding Self-Feeding Abilities: Able to feed self;Needs assist;Needs set up Patient Positioning: Upright in bed (needed education on sitting upright in bed for po's) Baseline Vocal Quality: Normal (gravely) Volitional Cough: Strong Volitional Swallow: Able to elicit    Oral/Motor/Sensory Function Overall Oral Motor/Sensory Function: Within functional limits   Ice Chips Ice chips: Not tested   Thin Liquid Thin Liquid: Within functional limits Presentation: Self Fed;Straw (~4+ ozs total)    Nectar Thick Nectar Thick Liquid: Not tested   Honey Thick Honey Thick Liquid: Not tested   Puree Puree: Within functional limits Presentation: Spoon (3 trials)   Solid     Solid: Within functional limits Presentation: Self Fed;Spoon (5-6 trials)        Jerilynn Som, MS, CCC-SLP Speech Language Pathologist Rehab Services [phone] Renato Spellman 02/11/2020,2:04 PM

## 2020-02-11 NOTE — TOC Progression Note (Signed)
Transition of Care [facility]) - Progression Note    Patient Details  Name: Margaret Staggs MRN: [id number] Date of Birth: 1970-11-04  [facility] CM/SW Contact  Shawn Route, RN Phone Number: 02/11/2020, 12:45 PM  Clinical Narrative:    I haven spoken to patient and his brother last week.  Brother reported at discharge they are interested in therapy if I am able to get charity therapy for him.  He reported to me at that time he was going to take his brother to a hotel at discharge.  Since that time he has requested for his brother to be transferred with the attending.  The physician access line was provided for [facility] at that time.  It is reported they are not accepting transfers at this time.  Since that time, patients family requested with Nursing Director on current floor that he would like to see if his brother can be transferred to [facility] in [place].  I called and was told that they are accepting transfers for Medical Reasons.  The Patient Transfer Center number was sent to physician and Director.    Expected Discharge Plan: Home/Self Care Barriers to Discharge: Continued Medical Work up  Expected Discharge Plan and Services Expected Discharge Plan: Home/Self Care       Living arrangements for the past 2 months: Single Family Home                                       Social Determinants of Health (SDOH) Interventions    Readmission Risk Interventions No flowsheet data found.

## 2020-02-11 NOTE — Progress Notes (Signed)
Physical Therapy Treatment Patient Details Name: [name] MRN: [id number] DOB: Aug 26, 1970 Today's Date: 02/11/2020    History of Present Illness 49 y.o. male with a known history of polysubstance abuse including alcohol abuse who presented to the emergency room with acute onset of increased generalized weakness and a mechanical fall.  Alcohol level 296 on admission.  Patient was transferred to ICU on 6 September due to increased work of breathing and accessory muscle use.  He was withdrawing from alcohol required intubation and mechanical ventilation. Extubated 9/12, Precedex drip stopped 9/14.    PT Comments    Pt presents to bed in semi HOB position. Pt is talking well with good volume and tone, which is a huge improvement from last Friday. He has improved strength in his RLE as he was able to perform SLR and heel slides supine with good ROM. Pt is still min to modA+1 during bed mobility but able to push his UE strength using the bed rail to come sitting EOB. Attempted sit to stand transfer but pt unable to stand up from lowest bed. Transitioned to squat pivot transfer to the chair with modA+1, but he had good eccentric control to sit down. Performed exercises in chair to help strengthen LE. Throughout session, pt is impulsive as he wants to move before it is safe to. Pt currently not on O2 on entering the room, so monitored O2 levels throughout. He was between 90-93 with exertional activity. Pt left in chair with all needs. Pt would benefit from skilled PT to address impairments and functional limitations in order to maximize function.  Follow Up Recommendations  SNF     Equipment Recommendations  Rolling walker with 5" wheels;3in1 (PT)    Recommendations for Other Services       Precautions / Restrictions Precautions Precautions: Fall Restrictions Weight Bearing Restrictions: No    Mobility  Bed Mobility Overal bed mobility: Needs Assistance Bed Mobility:  Rolling;Sidelying to Sit Rolling: Mod assist Sidelying to sit: Min assist       General bed mobility comments: Patient able to perform rolling to L side then sitting EOB from sidelying position with minA to modA+1. Pt relied on therapist for UE support during rolling, but once in sidelying he used his UE to push up and come sit to EOB with minA+1 for safety.   Transfers Overall transfer level: Needs assistance   Transfers: Squat Pivot Transfers     Squat pivot transfers: Mod assist     General transfer comment: Pt attempted sit to stand transfer with RW, but was unsuccessful. Pt did not want to try on elevated surface, so transitioned to squat pivot to the recliner chair. Pt was modA+1 for transfer but able to help lower himself in chair wiith good control  Ambulation/Gait             General Gait Details: Did not attempt gait due to patient's refusal   Stairs             Wheelchair Mobility    Modified Rankin (Stroke Patients Only)       Balance Overall balance assessment: Needs assistance Sitting-balance support: Feet supported Sitting balance-Leahy Scale: Fair Sitting balance - Comments: Pt able to maintain seated balance with no UE support with feet support. Pt reports minimal dizziness Postural control: Other (comment) (No postural lean noted)     Standing balance comment: Did not stand due to squat pivot  Cognition Arousal/Alertness: Awake/alert Behavior During Therapy: WFL for tasks assessed/performed;Impulsive Overall Cognitive Status: Within Functional Limits for tasks assessed                                 General Comments: Pt is alert and able to follow directions. He is now speaking clearly and able to have a normal conversation. He is still impulsive as he wants to move before it is safe; His congition is still impaired as he is not on the same page as therapists during session.       Exercises General Exercises - Lower Extremity Heel Slides: Supine;AROM;Right;5 reps Straight Leg Raises: AROM;Both;5 reps;Supine Hip Flexion/Marching: AROM;10 reps;Both;Seated    General Comments        Pertinent Vitals/Pain Pain Assessment: No/denies pain    Home Living                      Prior Function            PT Goals (current goals can now be found in the care plan section) Acute Rehab PT Goals Patient Stated Goal: To get stronger and be independent again PT Goal Formulation: With patient Time For Goal Achievement: 02/19/20 Potential to Achieve Goals: Fair Progress towards PT goals: Progressing toward goals    Frequency    Min 2X/week      PT Plan Current plan remains appropriate    Co-evaluation              AM-PAC PT "6 Clicks" Mobility   Outcome Measure  Help needed turning from your back to your side while in a flat bed without using bedrails?: A Lot Help needed moving from lying on your back to sitting on the side of a flat bed without using bedrails?: A Lot Help needed moving to and from a bed to a chair (including a wheelchair)?: A Lot Help needed standing up from a chair using your arms (e.g., wheelchair or bedside chair)?: Total Help needed to walk in hospital room?: Total Help needed climbing 3-5 steps with a railing? : Total 6 Click Score: 9    End of Session Equipment Utilized During Treatment: Gait belt Activity Tolerance: Patient tolerated treatment well;Patient limited by fatigue Patient left: in chair;with chair alarm set;with call bell/phone within reach;with family/visitor present Nurse Communication: Mobility status PT Visit Diagnosis: Muscle weakness (generalized) (M62.81);Difficulty in walking, not elsewhere classified (R26.2);Pain;Unsteadiness on feet (R26.81)     Time: 6333-5456 PT Time Calculation (min) (ACUTE ONLY): 29 min  Charges:                         Katherine Basset, SPT Baker Pierini 02/11/2020,  5:14 PM

## 2020-02-11 NOTE — Progress Notes (Signed)
PROGRESS NOTE    [name]  WUJ:[id number] DOB: [birth year] DOA: 01/23/2020 PCP: Remi Haggard, FNP  Outpatient Specialists:     Brief Narrative:  Cody Rucinski [name] an 49 y.o.malewith a known history of polysubstance abuse including alcohol abuse who presented to the emergency room with acute onset of increased generalized weakness and amechanicalfall.Alcohol level 296 on admission. Patient was transferred to ICU on 6 September due to increased work of breathing and accessory muscle use. He was withdrawing from alcohol required intubation and mechanical ventilation.  Events: 9/6 transferred to ICU for increased WOB, using accessory muscles to breathe with liver failure 9/7 remains on vent, liver failure, PICC line placed, brother updated 9/8 remains encephalopathic, liver failure, resp failure 9/10 remains on ventilator. Long discussion and update with brother. Sedatives switch to Precedex and fentanyl, continue CIWA scale 9/11still with some agitation when sedation is lightened. Intolerance to SBT due to agitation. 9/12 extubated 9/13 hypoxia improving 9/14 more alert awake, DT's improving, transferred out of ICU 9/15 more alert, DTs continue to improve 9/16 more alert, DTs improving still. Overnight a fib with rvr and fever, cxr showing pulmonary congestion. Treated with IV lasix and diltiazem drip and broad spectrum abx, dilt trip weaned off after a few hours.  9/19 more awake and alert, DTs improving 9/20 continued improvement  Assessment & Plan:   Principal Problem:   Sepsis ([place]) Active Problems:   Acute blood loss anemia   GIB (gastrointestinal bleeding)   Transaminitis   Hyponatremia   Hypokalemia   Falls   Hip pain   Rhabdomyolysis   Hypovolemia   Hypotension   Alcohol abuse   Goals of care, counseling/discussion   Palliative care by specialist   PAF (paroxysmal atrial fibrillation) ([place])   Pure hypercholesterolemia  # new-onset  atrial fibrillation w/ RVR - occurred night of 9/16, in setting of fluid overload, poss infection. CXR with pulmonary congestion. Treated w/ iv lasix and dilt drip, dilt now off and has resumed home metoprolol, rate controlled appears to be in sinus. Edema much improved (though not clear if outs have been accurately recorded) - decrease lasix to 20 - replete lytes as below - cont metoprolol - cardiology consulted and has signed off. No anticoagulation.  # Family request of transfer to [facility] - called [facility] transfer center and told due to covid surge they are not currently accepting outside transfers  # Fever - in setting of a fib w/ rvr. On 9/16. Now abated. ua unremarkable. CXR without signs infection but CT showing multifocal opacities. Blood cultures drawn and neg to date (has picc). Started on broad-spectrum abx. May be sequelae of DTs. No fluid on abd u/s, no sig effusion on chest CT - s/p abx (vanc/zosyn (9/17 > 9/19), flagyl 9/17-9/18  - f/u blood cultures  # Hypoxic hypercapnic respiratory failure due to multiorgan liver failure and aspirationi pneumonia. Now extubated. Mild tachypnea continues.  - has weaned to 1 L - incentive spirometry - SLP eval today, will advance diet if passes  # Acute alcohol hepatitis with hepatic encephalopathy - stable - cont lactulose and bid ppi - GI advises no further interventions, supportive care, has signed off  # Electrolytes - k 3.5 in setting of ongoing lasix, will order k 60  # Acute diastolic heart failure - lasix as above  # Macrocytic anemia - H 10s, stable. B12/folate wnl  # Delirium tremens  - improving, s/p precedex - cont ativan prn - cont thiamine, lactulose, rifaximin.  Cont bid lactulose  # Right gluteal hematoma - seen by ortho this hospitalization, advised no intervention. With significant dependent bruising right leg. Improving - PVLs ordered by night team to eval for DVT, negative for DVT    DVT  prophylaxis: heparin Code Status: full Family Communication: friend updated @ bedside Disposition Plan: needs SNF, has no insurance, care mgmt to look into charity outpt pt/ot. Has been declined by inpatient rehab   Consultants:   GI, cardiology, orthopedics all signed off   Procedures:    Antimicrobials:   s/p cefepime 7 days then ceftriaxone 5 days 9/6-9/13.\  Vanc/zosyn/flagyl 9/17 > 9/19  Lines: left picc  Subjective: Voice improved. Tremor resolved. More energy and alertness. No cough or fever. Wants a diet. No vomiting or diarrhea.  Objective: Vitals:   02/11/20 0500 02/11/20 0537 02/11/20 0753 02/11/20 1130  BP:  (!) 145/85 122/74 134/89  Pulse:  70 72 71  Resp:  $Remo'18 17 18  'Jpylx$ Temp:  99.1 F (37.3 C) 98.7 F (37.1 C) 98.7 F (37.1 C)  TempSrc:  Oral Oral Oral  SpO2:  92% 96% 92%  Weight: 99.6 kg     Height:        Intake/Output Summary (Last 24 hours) at 02/11/2020 1233 Last data filed at 02/11/2020 0806 Gross per 24 hour  Intake 325.1 ml  Output 350 ml  Net -24.9 ml   Filed Weights   02/09/20 0319 02/10/20 0303 02/11/20 0500  Weight: 108.7 kg 101.2 kg 99.6 kg    Examination:  General exam: NAD Respiratory system: poor effort, scattered rhonchi, rales @ bases Cardiovascular system: S1 & S2 heard, RRR. Mild systolic murmur Gastrointestinal system: abd moderately distended, non tender Central nervous system: Alert , oriented to self Extremities: Symmetric 5 x 5 power. Skin: echymoses dependent side of b/l LEs right worse than left. improving Psychiatry: not agitated    Data Reviewed:   CBC: Recent Labs  Lab 02/05/20 0440 02/05/20 0440 02/06/20 0646 02/06/20 0646 02/07/20 0739 02/08/20 0545 02/09/20 0645 02/10/20 0437 02/11/20 0615  WBC 11.8*   < > 15.0*   < > 13.3* 13.2* 12.0* 12.9* 11.8*  NEUTROABS 8.6*  --  10.9*  --  9.3* 9.0*  --   --   --   HGB 9.8*   < > 9.5*   < > 9.6* 10.6* 10.5* 10.4* 11.1*  HCT 31.8*   < > 30.2*   < > 30.6*  32.7* 31.3* 32.1* 33.3*  MCV 107.1*   < > 107.9*   < > 108.5* 104.8* 103.6* 106.6* 103.4*  PLT 167   < > 191   < > 177 167 143* 138* 134*   < > = values in this interval not displayed.   Basic Metabolic Panel: Recent Labs  Lab 02/06/20 0646 02/06/20 0646 02/07/20 0739 02/08/20 0545 02/09/20 0645 02/10/20 0437 02/11/20 0615  NA 145   < > 143 143 141 141 140  K 3.7   < > 3.6 3.4* 3.2* 3.0* 3.5  CL 110   < > 108 108 103 99 98  CO2 25   < > $R'24 25 28 'UA$ 32 34*  GLUCOSE 108*   < > 92 101* 95 89 92  BUN 31*   < > 22* 23* $Remov'20 19 19  'HMwQGq$ CREATININE 0.86   < > 0.68 0.60* 0.65 0.52* 0.72  CALCIUM 8.9   < > 8.9 9.0 9.0 8.9 9.2  MG 1.8   < > 1.6* 1.6* 1.5* 1.4* 1.7  PHOS 3.5  3.5  --  3.8  3.9 4.3 4.0 3.6  --    < > = values in this interval not displayed.   GFR: Estimated Creatinine Clearance: 125.6 mL/min (by C-G formula based on SCr of 0.72 mg/dL). Liver Function Tests: Recent Labs  Lab 02/07/20 0739 02/08/20 0545 02/09/20 0645 02/10/20 0437 02/11/20 0615  AST 154* 136* 107* 97* 89*  ALT 83* 82* 70* 63* 57*  ALKPHOS 46 52 50 55 57  BILITOT 9.1* 9.6* 10.0* 8.4* 8.5*  PROT 6.5 6.9 6.6 6.4* 6.5  ALBUMIN 2.7*  2.7* 2.8* 2.8* 2.7* 2.8*   No results for input(s): LIPASE, AMYLASE in the last 168 hours. No results for input(s): AMMONIA in the last 168 hours. Coagulation Profile: Recent Labs  Lab 02/08/20 0545  INR 1.5*   Cardiac Enzymes: No results for input(s): CKTOTAL, CKMB, CKMBINDEX, TROPONINI in the last 168 hours. BNP (last 3 results) No results for input(s): PROBNP in the last 8760 hours. HbA1C: No results for input(s): HGBA1C in the last 72 hours. CBG: Recent Labs  Lab 02/10/20 0304 02/10/20 0734 02/10/20 1120 02/10/20 1639 02/10/20 2011  GLUCAP 94 98 121* 89 98   Lipid Profile: No results for input(s): CHOL, HDL, LDLCALC, TRIG, CHOLHDL, LDLDIRECT in the last 72 hours. Thyroid Function Tests: Recent Labs    02/09/20 0645  TSH 3.676   Anemia Panel: No  results for input(s): VITAMINB12, FOLATE, FERRITIN, TIBC, IRON, RETICCTPCT in the last 72 hours. Urine analysis:    Component Value Date/Time   COLORURINE AMBER (A) 02/08/2020 0455   APPEARANCEUR HAZY (A) 02/08/2020 0455   LABSPEC 1.026 02/08/2020 0455   PHURINE 5.0 02/08/2020 0455   GLUCOSEU NEGATIVE 02/08/2020 0455   HGBUR SMALL (A) 02/08/2020 0455   BILIRUBINUR NEGATIVE 02/08/2020 0455   [place] 02/08/2020 0455   PROTEINUR 30 (A) 02/08/2020 0455   NITRITE NEGATIVE 02/08/2020 0455   LEUKOCYTESUR NEGATIVE 02/08/2020 0455   Sepsis Labs: $RemoveBefo'@LABRCNTIP'YPlBZAlURkO$ (procalcitonin:4,lacticidven:4)  ) Recent Results (from the past 240 hour(s))  Culture, blood (x 2)     Status: None (Preliminary result)   Collection Time: 02/08/20  5:45 AM   Specimen: BLOOD  Result Value Ref Range Status   Specimen Description BLOOD LINE  Final   Special Requests   Final    BOTTLES DRAWN AEROBIC AND ANAEROBIC Blood Culture results may not be optimal due to an excessive volume of blood received in culture bottles   Culture   Final    NO GROWTH 3 DAYS Performed at [facility], [address] [place], [place] [address]    Report Status PENDING  Incomplete  Culture, blood (x 2)     Status: None (Preliminary result)   Collection Time: 02/08/20  7:16 AM   Specimen: BLOOD  Result Value Ref Range Status   Specimen Description BLOOD RIGHT ANTECUBITAL  Final   Special Requests   Final    BOTTLES DRAWN AEROBIC AND ANAEROBIC Blood Culture adequate volume   Culture   Final    NO GROWTH 3 DAYS Performed at [facility], [address] [place], [place] [address]    Report Status PENDING  Incomplete         Radiology Studies: No results found.      Scheduled Meds: . chlorhexidine gluconate (MEDLINE KIT)  15 mL Mouth Rinse BID  . Chlorhexidine Gluconate Cloth  6 each Topical Daily  . feeding supplement (ENSURE ENLIVE)  237 mL Oral TID BM  . folic  acid  1 mg Oral Daily  .  [START ON 02/12/2020] furosemide  20 mg Oral Daily  . heparin  5,000 Units Subcutaneous Q8H  . lactulose  30 g Oral BID  . metoprolol tartrate  37.5 mg Oral BID  . multivitamin with minerals  1 tablet Oral Daily  . pantoprazole (PROTONIX) IV  40 mg Intravenous Q12H  . potassium chloride  60 mEq Oral Once  . rifaximin  550 mg Oral BID  . sodium chloride flush  10-40 mL Intracatheter Q12H  . sodium chloride flush  10-40 mL Intracatheter Q12H  . thiamine  100 mg Oral Daily  . traMADol  50 mg Oral Q6H   Continuous Infusions: . sodium chloride Stopped (02/10/20 0859)  . sodium chloride       LOS: 19 days    Time spent: Mallard, MD [facility] Pager [phone]  If 7PM-7AM, please contact night-coverage [email or web address] Password [facility] 02/11/2020, 12:33 PM

## 2020-02-12 ENCOUNTER — Inpatient Hospital Stay: Payer: Self-pay

## 2020-02-12 LAB — COMPREHENSIVE METABOLIC PANEL
ALT: 52 U/L — ABNORMAL HIGH (ref 0–44)
AST: 81 U/L — ABNORMAL HIGH (ref 15–41)
Albumin: 2.8 g/dL — ABNORMAL LOW (ref 3.5–5.0)
Alkaline Phosphatase: 50 U/L (ref 38–126)
Anion gap: 9 (ref 5–15)
BUN: 18 mg/dL (ref 6–20)
CO2: 32 mmol/L (ref 22–32)
Calcium: 9 mg/dL (ref 8.9–10.3)
Chloride: 98 mmol/L (ref 98–111)
Creatinine, Ser: 0.53 mg/dL — ABNORMAL LOW (ref 0.61–1.24)
GFR calc Af Amer: >60 mL/min (ref >60)
GFR calc non Af Amer: >60 mL/min (ref >60)
Glucose, Bld: 94 mg/dL (ref 70–99)
Potassium: 3.6 mmol/L (ref 3.5–5.1)
Sodium: 139 mmol/L (ref 135–145)
Total Bilirubin: 8.4 mg/dL — ABNORMAL HIGH (ref 0.3–1.2)
Total Protein: 6.8 g/dL (ref 6.5–8.1)

## 2020-02-12 LAB — CBC
HCT: 33.5 % — ABNORMAL LOW (ref 39.0–52.0)
Hemoglobin: 11.1 g/dL — ABNORMAL LOW (ref 13.0–17.0)
MCH: 35 pg — ABNORMAL HIGH (ref 26.0–34.0)
MCHC: 33.1 g/dL (ref 30.0–36.0)
MCV: 105.7 fL — ABNORMAL HIGH (ref 80.0–100.0)
Platelets: 143 10*3/uL — ABNORMAL LOW (ref 150–400)
RBC: 3.17 MIL/uL — ABNORMAL LOW (ref 4.22–5.81)
RDW: 25 % — ABNORMAL HIGH (ref 11.5–15.5)
WBC: 12.4 10*3/uL — ABNORMAL HIGH (ref 4.0–10.5)
nRBC: 0 % (ref 0.0–0.2)

## 2020-02-12 LAB — MAGNESIUM: Magnesium: 1.5 mg/dL — ABNORMAL LOW (ref 1.7–2.4)

## 2020-02-12 MED ORDER — MAGNESIUM SULFATE 2 GM/50ML IV SOLN
2.0000 g | Freq: Once | INTRAVENOUS | Status: AC
  Administered 2020-02-12: 2 g via INTRAVENOUS
  Filled 2020-02-12: qty 50

## 2020-02-12 MED ORDER — POTASSIUM CHLORIDE CRYS ER 20 MEQ PO TBCR
60.0000 meq | EXTENDED_RELEASE_TABLET | Freq: Once | ORAL | Status: AC
  Administered 2020-02-12: 60 meq via ORAL
  Filled 2020-02-12: qty 3

## 2020-02-12 NOTE — TOC Progression Note (Signed)
Transition of Care [facility]) - Progression Note    Patient Details  Name: [name] MRN: [id number] Date of Birth: 04/21/1971  Transition of Care [facility]) CM/SW [place], RN Phone Number: 02/12/2020, 4:42 PM  Clinical Narrative:     Met with patient and brother [name] today.  Brother reported he is planning on having his brother flown to [place] at discharge.  I encouraged brother to be present for training on how to help brother transfer from wheelchair to car/chair.   They report they already have a walker.  I also encouraged patient to work with PT/OT when they come for treatments.  No beds currently available in a SNF with no insurance.    Brother reports he has made an appointment at [facility] with a Dr Emelda Brothers at 1:20pm for followup.  Brother expressed difficulty getting medical records and reported that he could not get them to be sent to physician in [place].  Spoke with Evette, Director of [address] and she followed up with Medical Records and is providing information on correct way to request records and that records are not available until after patient is discharged.    Brother: [name] reports his cell number is [phone] although he does not have his phone with him, he reports that he has purchases temporary "burner" phone while brother is in hospital and is not sharing that number as he plans to throw the phones away after discharge.  If we need to reach him while he is in hospital he will may be available through his brother's cell number  [phone].     Expected Discharge Plan: Home/Self Care Barriers to Discharge: Continued Medical Work up  Expected Discharge Plan and Services Expected Discharge Plan: Home/Self Care       Living arrangements for the past 2 months: Single Family Home                                       Social Determinants of Health (SDOH) Interventions    Readmission Risk Interventions No flowsheet data  found.

## 2020-02-12 NOTE — Progress Notes (Addendum)
PROGRESS NOTE    [name]  WNI:[id number] DOB: 06/05/1970 DOA: 01/23/2020 PCP: Remi Haggard, FNP  Outpatient Specialists:     Brief Narrative:  [name] an 49 y.o.malewith a known history of polysubstance abuse including alcohol abuse who presented to the emergency room with acute onset of increased generalized weakness and amechanicalfall.Alcohol level 296 on admission. Patient was transferred to ICU on 6 September due to increased work of breathing and accessory muscle use. He was withdrawing from alcohol required intubation and mechanical ventilation.  Events: 9/6 transferred to ICU for increased WOB, using accessory muscles to breathe with liver failure 9/7 remains on vent, liver failure, PICC line placed, brother updated 9/8 remains encephalopathic, liver failure, resp failure 9/10 remains on ventilator. Long discussion and update with brother. Sedatives switch to Precedex and fentanyl, continue CIWA scale 9/11still with some agitation when sedation is lightened. Intolerance to SBT due to agitation. 9/12 extubated 9/13 hypoxia improving 9/14 more alert awake, DT's improving, transferred out of ICU 9/15 more alert, DTs continue to improve 9/16 more alert, DTs improving still. Overnight a fib with rvr and fever, cxr showing pulmonary congestion. Treated with IV lasix and diltiazem drip and broad spectrum abx, dilt trip weaned off after a few hours.  9/19 more awake and alert, DTs improving 9/20 continued improvement 9/20 continued improvement  Assessment & Plan:   # Ankle pain - "twisted" ankle getting into bed yesterday, today no bruising but tenderness around lateral malleolus - x-ray w/o signs fracture, will continue to monitor  # new-onset atrial fibrillation w/ RVR - occurred night of 9/16, likely 2/2 fluidoverload. CXR with pulmonary congestion. Treated w/ iv lasix and dilt drip, dilt now off and has resumed home metoprolol, rate  controlled appears to be in sinus. Edema much improved. TTE normal systolid/diastolic function - stop lasix - replete lytes as below - cont metoprolol - cardiology consulted and has signed off. No anticoagulation.  # Family request of transfer to outside facility - called [facility] transfer center and told due to covid surge they are not currently accepting outside transfers - today brother says family no longer desires outside transfer given patient's improvement and given we are getting close to discharge  # Fever - in setting of a fib w/ rvr. On 9/16. Now abated. ua unremarkable. CXR without signs infection but CT showing multifocal opacities. Blood cultures drawn and neg to date (has picc). Started on broad-spectrum abx. May be sequelae of DTs. No fluid on abd u/s, no sig effusion on chest CT - s/p abx (vanc/zosyn (9/17 > 9/19), flagyl 9/17-9/18  - blood cultures neg to date.  # Hypoxic hypercapnic respiratory failure due to multiorgan liver failure and aspirationi pneumonia. Now extubated. Mild tachypnea continues.  - has weaned off O2 - incentive spirometry  # Acute alcohol hepatitis with hepatic encephalopathy - stable - cont lactulose and bid ppi and rifaximin - GI advises no further interventions, supportive care, has signed off  # Electrolytes - k 3.5 in setting of ongoing lasix, will order k 60 - mg 1.5, will order 2 g IV  # Macrocytic anemia - H 10s, stable. B12/folate wnl  # Delirium tremens  - improving, s/p precedex - cont ativan prn - cont thiamine  # Right gluteal hematoma - seen by ortho this hospitalization, advised no intervention. With significant dependent bruising right leg. PVLs ordered by night team to eval for DVT, negative for DVT. Improving    DVT prophylaxis: heparin Code Status:  full Family Communication: brother updated @ bedside Disposition Plan: needs SNF, has no insurance, care mgmt to look into charity outpt pt/ot. Has been declined by  inpatient rehab   Consultants:   GI, cardiology, orthopedics all signed off   Procedures:    Antimicrobials:   s/p cefepime 7 days then ceftriaxone 5 days 9/6-9/13.\  Vanc/zosyn/flagyl 9/17 > 9/19  Lines: left picc  Subjective: Feeling pretty good. Good appetite. Twisted ankle yesterday getting into bed. No abd pain or diarrhea.   Objective: Vitals:   02/11/20 1938 02/12/20 0020 02/12/20 0426 02/12/20 0805  BP: (!) 160/79 (!) 142/73 139/78 131/78  Pulse: 70  63 71  Resp: $Remo'20 19 20 17  'bkYPO$ Temp: 98.6 F (37 C) 99.4 F (37.4 C) 99.8 F (37.7 C) 98.3 F (36.8 C)  TempSrc: Oral Oral Oral   SpO2: 96% 98% 95% 95%  Weight:   95.9 kg   Height:        Intake/Output Summary (Last 24 hours) at 02/12/2020 1039 Last data filed at 02/12/2020 0902 Gross per 24 hour  Intake 370 ml  Output --  Net 370 ml   Filed Weights   02/10/20 0303 02/11/20 0500 02/12/20 0426  Weight: 101.2 kg 99.6 kg 95.9 kg    Examination:  General exam: NAD Respiratory system: rales at bases, no wheeze Cardiovascular system: S1 & S2 heard, RRR. Mild systolic murmur Gastrointestinal system: abd moderately distended, non tender Central nervous system: Alert , oriented to self Extremities: Symmetric 5 x 5 power. MSK: ttp right malleolus r ankle, no bruising Skin: echymoses dependent side of b/l LEs right worse than left. improving Psychiatry: not agitated    Data Reviewed:   CBC: Recent Labs  Lab 02/06/20 0646 02/06/20 0646 02/07/20 0739 02/07/20 0739 02/08/20 0545 02/09/20 0645 02/10/20 0437 02/11/20 0615 02/12/20 0552  WBC 15.0*   < > 13.3*   < > 13.2* 12.0* 12.9* 11.8* 12.4*  NEUTROABS 10.9*  --  9.3*  --  9.0*  --   --   --   --   HGB 9.5*   < > 9.6*   < > 10.6* 10.5* 10.4* 11.1* 11.1*  HCT 30.2*   < > 30.6*   < > 32.7* 31.3* 32.1* 33.3* 33.5*  MCV 107.9*   < > 108.5*   < > 104.8* 103.6* 106.6* 103.4* 105.7*  PLT 191   < > 177   < > 167 143* 138* 134* 143*   < > = values in this  interval not displayed.   Basic Metabolic Panel: Recent Labs  Lab 02/06/20 0646 02/06/20 0646 02/07/20 0739 02/07/20 0739 02/08/20 0545 02/09/20 0645 02/10/20 0437 02/11/20 0615 02/12/20 0552  NA 145   < > 143   < > 143 141 141 140 139  K 3.7   < > 3.6   < > 3.4* 3.2* 3.0* 3.5 3.6  CL 110   < > 108   < > 108 103 99 98 98  CO2 25   < > 24   < > 25 28 32 34* 32  GLUCOSE 108*   < > 92   < > 101* 95 89 92 94  BUN 31*   < > 22*   < > 23* $Rem'20 19 19 18  'IATP$ CREATININE 0.86   < > 0.68   < > 0.60* 0.65 0.52* 0.72 0.53*  CALCIUM 8.9   < > 8.9   < > 9.0 9.0 8.9 9.2 9.0  MG 1.8   < >  1.6*   < > 1.6* 1.5* 1.4* 1.7 1.5*  PHOS 3.5  3.5  --  3.8  3.9  --  4.3 4.0 3.6  --   --    < > = values in this interval not displayed.   GFR: Estimated Creatinine Clearance: 123.2 mL/min (A) (by C-G formula based on SCr of 0.53 mg/dL (L)). Liver Function Tests: Recent Labs  Lab 02/08/20 0545 02/09/20 0645 02/10/20 0437 02/11/20 0615 02/12/20 0552  AST 136* 107* 97* 89* 81*  ALT 82* 70* 63* 57* 52*  ALKPHOS 52 50 55 57 50  BILITOT 9.6* 10.0* 8.4* 8.5* 8.4*  PROT 6.9 6.6 6.4* 6.5 6.8  ALBUMIN 2.8* 2.8* 2.7* 2.8* 2.8*   No results for input(s): LIPASE, AMYLASE in the last 168 hours. No results for input(s): AMMONIA in the last 168 hours. Coagulation Profile: Recent Labs  Lab 02/08/20 0545  INR 1.5*   Cardiac Enzymes: No results for input(s): CKTOTAL, CKMB, CKMBINDEX, TROPONINI in the last 168 hours. BNP (last 3 results) No results for input(s): PROBNP in the last 8760 hours. HbA1C: No results for input(s): HGBA1C in the last 72 hours. CBG: Recent Labs  Lab 02/10/20 0304 02/10/20 0734 02/10/20 1120 02/10/20 1639 02/10/20 2011  GLUCAP 94 98 121* 89 98   Lipid Profile: No results for input(s): CHOL, HDL, LDLCALC, TRIG, CHOLHDL, LDLDIRECT in the last 72 hours. Thyroid Function Tests: No results for input(s): TSH, T4TOTAL, FREET4, T3FREE, THYROIDAB in the last 72 hours. Anemia  Panel: No results for input(s): VITAMINB12, FOLATE, FERRITIN, TIBC, IRON, RETICCTPCT in the last 72 hours. Urine analysis:    Component Value Date/Time   COLORURINE AMBER (A) 02/08/2020 0455   APPEARANCEUR HAZY (A) 02/08/2020 0455   LABSPEC 1.026 02/08/2020 0455   PHURINE 5.0 02/08/2020 0455   GLUCOSEU NEGATIVE 02/08/2020 0455   HGBUR SMALL (A) 02/08/2020 0455   BILIRUBINUR NEGATIVE 02/08/2020 0455   KETONESUR NEGATIVE 02/08/2020 0455   PROTEINUR 30 (A) 02/08/2020 0455   NITRITE NEGATIVE 02/08/2020 0455   LEUKOCYTESUR NEGATIVE 02/08/2020 0455   Sepsis Labs: @LABRCNTIP (procalcitonin:4,lacticidven:4)  ) Recent Results (from the past 240 hour(s))  Culture, blood (x 2)     Status: None (Preliminary result)   Collection Time: 02/08/20  5:45 AM   Specimen: BLOOD  Result Value Ref Range Status   Specimen Description BLOOD LINE  Final   Special Requests   Final    BOTTLES DRAWN AEROBIC AND ANAEROBIC Blood Culture results may not be optimal due to an excessive volume of blood received in culture bottles   Culture   Final    NO GROWTH 4 DAYS Performed at [facility], [address] [place], [place]    Report Status PENDING  Incomplete  Culture, blood (x 2)     Status: None (Preliminary result)   Collection Time: 02/08/20  7:16 AM   Specimen: BLOOD  Result Value Ref Range Status   Specimen Description BLOOD RIGHT ANTECUBITAL  Final   Special Requests   Final    BOTTLES DRAWN AEROBIC AND ANAEROBIC Blood Culture adequate volume   Culture   Final    NO GROWTH 4 DAYS Performed at [facility]: [address], [address], [place], [place]    Report Status PENDING  Incomplete         Radiology Studies: No results found.      Scheduled Meds: . chlorhexidine gluconate (MEDLINE KIT)  15 mL Mouth Rinse BID  . Chlorhexidine Gluconate Cloth  6 each Topical Daily  . feeding supplement (ENSURE ENLIVE)  237 mL Oral TID BM  . folic acid  1 mg Oral  Daily  . heparin  5,000 Units Subcutaneous Q8H  . lactulose  30 g Oral BID  . metoprolol tartrate  37.5 mg Oral BID  . multivitamin with minerals  1 tablet Oral Daily  . pantoprazole (PROTONIX) IV  40 mg Intravenous Q12H  . potassium chloride  60 mEq Oral Once  . rifaximin  550 mg Oral BID  . sodium chloride flush  10-40 mL Intracatheter Q12H  . sodium chloride flush  10-40 mL Intracatheter Q12H  . thiamine  100 mg Oral Daily   Continuous Infusions: . sodium chloride Stopped (02/10/20 0859)  . sodium chloride    . magnesium sulfate bolus IVPB       LOS: 20 days    Time spent: Martinez, MD [facility]  If 7PM-7AM, please contact night-coverage [email or web address] Password [facility] 02/12/2020, 10:39 AM

## 2020-02-12 NOTE — Progress Notes (Signed)
Nutrition Follow-up  DOCUMENTATION CODES:   Obesity unspecified  INTERVENTION:   Ensure Enlive po TID, each supplement provides 350 kcal and 20 grams of protein. Patient prefers vanilla.  MVI, thiamine and folic acid daily in setting of etoh abuse  NUTRITION DIAGNOSIS:   Inadequate oral intake related to decreased appetite as evidenced by per patient/family report.  GOAL:   Patient will meet greater than or equal to 90% of their needs -progressing   MONITOR:   PO intake, Supplement acceptance, Labs, Weight trends, Skin, I & O's  ASSESSMENT:   49 year old male with PMHx of polysubstance abuse, EtOH abuse admitted with acute onset of generalized weakness and mechanical fall with subsequent right hip pain and large right hip hematoma, also with liver failure, hepatic encephalopathy, acute aspiration PNA, possible OSA.   Pt with improved appetite and oral intake; pt ate 75% of his breakfast today. Pt is drinking most of his Ensure supplements. Recommend continue supplements and vitamins after discharge. Per chart, pt is down ~30lbs from his admit weight; pt is noted to have new onset A fib with RVR likely secondary to fluid overload per MD note. Pt was given lasix. UOP noted to be x 24 hrs.   Medications reviewed and include: folic acid, heparin, lactulose, MVI, protonix, thiamine  Labs reviewed: creat 0.53(L), Mg 1.5(L) P 3.6 wnl- 9/19 Wbc- 12.4(H)  Diet Order:   Diet Order            Diet regular Room service appropriate? Yes with Assist; Fluid consistency: Thin  Diet effective now                EDUCATION NEEDS:   No education needs have been identified at this time  Skin:  Skin Assessment: Reviewed RN Assessment  Last BM:  9/21- type 6  Height:   Ht Readings from Last 1 Encounters:  02/02/20 5' 7.01" (1.702 m)   Weight:   Wt Readings from Last 1 Encounters:  02/12/20 95.9 kg   Ideal Body Weight:  67.3 kg  BMI:  Body mass index is 33.11  kg/m.  Estimated Nutritional Needs:   Kcal:  2300-2500  Protein:  115-125 grams  Fluid:  >/=2.3 L/day  Betsey Holiday MS, RD, LDN Please refer to [facility] for RD and/or RD on-call/weekend/after hours pager

## 2020-02-12 NOTE — Progress Notes (Signed)
Cody Ivan NP notified of K 3.5 and mag of 1.5 No new orders at this time.

## 2020-02-13 ENCOUNTER — Inpatient Hospital Stay: Payer: Self-pay

## 2020-02-13 LAB — CBC
HCT: 32.9 % — ABNORMAL LOW (ref 39.0–52.0)
Hemoglobin: 10.8 g/dL — ABNORMAL LOW (ref 13.0–17.0)
MCH: 34.8 pg — ABNORMAL HIGH (ref 26.0–34.0)
MCHC: 32.8 g/dL (ref 30.0–36.0)
MCV: 106.1 fL — ABNORMAL HIGH (ref 80.0–100.0)
Platelets: 128 10*3/uL — ABNORMAL LOW (ref 150–400)
RBC: 3.1 MIL/uL — ABNORMAL LOW (ref 4.22–5.81)
RDW: 24.8 % — ABNORMAL HIGH (ref 11.5–15.5)
WBC: 12.5 10*3/uL — ABNORMAL HIGH (ref 4.0–10.5)
nRBC: 0 % (ref 0.0–0.2)

## 2020-02-13 LAB — COMPREHENSIVE METABOLIC PANEL
ALT: 49 U/L — ABNORMAL HIGH (ref 0–44)
AST: 77 U/L — ABNORMAL HIGH (ref 15–41)
Albumin: 2.8 g/dL — ABNORMAL LOW (ref 3.5–5.0)
Alkaline Phosphatase: 50 U/L (ref 38–126)
Anion gap: 10 (ref 5–15)
BUN: 17 mg/dL (ref 6–20)
CO2: 29 mmol/L (ref 22–32)
Calcium: 9 mg/dL (ref 8.9–10.3)
Chloride: 97 mmol/L — ABNORMAL LOW (ref 98–111)
Creatinine, Ser: 0.52 mg/dL — ABNORMAL LOW (ref 0.61–1.24)
GFR calc Af Amer: >60 mL/min (ref >60)
GFR calc non Af Amer: >60 mL/min (ref >60)
Glucose, Bld: 89 mg/dL (ref 70–99)
Potassium: 3.8 mmol/L (ref 3.5–5.1)
Sodium: 136 mmol/L (ref 135–145)
Total Bilirubin: 7.9 mg/dL — ABNORMAL HIGH (ref 0.3–1.2)
Total Protein: 6.7 g/dL (ref 6.5–8.1)

## 2020-02-13 LAB — CULTURE, BLOOD (ROUTINE X 2)
Culture: NO GROWTH
Culture: NO GROWTH
Special Requests: ADEQUATE

## 2020-02-13 LAB — MAGNESIUM: Magnesium: 1.7 mg/dL (ref 1.7–2.4)

## 2020-02-13 MED ORDER — PANTOPRAZOLE SODIUM 40 MG PO TBEC
40.0000 mg | DELAYED_RELEASE_TABLET | Freq: Two times a day (BID) | ORAL | Status: DC
  Administered 2020-02-13 – 2020-02-14 (×2): 40 mg via ORAL
  Filled 2020-02-13 (×2): qty 1

## 2020-02-13 NOTE — TOC Progression Note (Addendum)
Transition of Care [facility]) - Progression Note    Patient Details  Name: [name] MRN: [id number] Date of Birth: 04/20/1971  [facility] CM/SW Contact  Shawn Route, RN Phone Number: 02/13/2020, 9:44 AM  Clinical Narrative:      Manual Wheelchair ordered through [facility].  UPDATE:  Patient declined wheelchair when they contacted him.  Expected Discharge Plan: Home/Self Care Barriers to Discharge: Continued Medical Work up  Expected Discharge Plan and Services Expected Discharge Plan: Home/Self Care       Living arrangements for the past 2 months: Single Family Home                 DME Arranged: Wheelchair manual DME Agency: [employer] Date DME Agency Contacted: 02/13/20 Time DME Agency Contacted: [phone] Representative spoke with at DME Agency: Elease Hashimoto             Social Determinants of Health (SDOH) Interventions    Readmission Risk Interventions No flowsheet data found.

## 2020-02-13 NOTE — Progress Notes (Signed)
Paper sent to medical records for brother to have copy of patients medical records.  Copy of fax is in the chart.   Chaplain paged to come do advance directive.

## 2020-02-13 NOTE — Progress Notes (Signed)
Physical Therapy Treatment Patient Details Name: [name] MRN: [id number] DOB: 11-04-1970 Today's Date: 02/13/2020    History of Present Illness 49 y.o. male with a known history of polysubstance abuse including alcohol abuse who presented to the emergency room with acute onset of increased generalized weakness and a mechanical fall.  Alcohol level 296 on admission.  Patient was transferred to ICU on 6 September due to increased work of breathing and accessory muscle use.  He was withdrawing from alcohol required intubation and mechanical ventilation. Extubated 9/12, Precedex drip stopped 9/14.    PT Comments    Pt sitting EOB on arrival to room with significant other bedside. Patient reports that his RLE/foot hurts with weightbearing and movement but just resting pain is 0/10. He attempted to perform sit to stand transfer with RW but pt unable to stand due to RLE pain. Pt educated on how to perform sit to stand transfer with non weight bearing on RLE but patient refused to perform with elevated bed and multiple demonstrations/instructions. Patient notes his pain is "greater than 10/10". Pt laid back in bed independently and refused to perform exercises in bed. Pt left supine in bed with all needs and bed alarm set. Pt will continue to benefit from skilled PT to address impairments and functional limitations in order to maximize function.   Follow Up Recommendations  SNF     Equipment Recommendations  Rolling walker with 5" wheels;3in1 (PT)    Recommendations for Other Services       Precautions / Restrictions Precautions Precautions: Fall Restrictions Weight Bearing Restrictions: No    Mobility  Bed Mobility Overal bed mobility: Independent Bed Mobility: Sit to Supine           General bed mobility comments: Pt able to lie back in bed independently without any VCs or outside assistance  Transfers Overall transfer level: Needs assistance Equipment used: Rolling  walker (2 wheeled) Transfers: Sit to/from Stand Sit to Stand: Max assist;+2 physical assistance         General transfer comment: Pt attempted sit to stand transfer with RW, but was unsuccessful and R LE pain increased to 10/10. Pt did not want to try on elevated surface with instructions to perform transfer without any weightbearing on RLE.  Ambulation/Gait             General Gait Details: Did not perform gait due to patient's refusal and pain level.   Stairs             Wheelchair Mobility    Modified Rankin (Stroke Patients Only)       Balance Overall balance assessment: Needs assistance Sitting-balance support: Feet supported;No upper extremity supported Sitting balance-Leahy Scale: Good Sitting balance - Comments: Pt able to maintain seated balance with no UE supported   Standing balance support: Bilateral upper extremity supported Standing balance-Leahy Scale: Zero Standing balance comment: Attempted to stand, but not able to maintain due to RLE pain                            Cognition Arousal/Alertness: Awake/alert Behavior During Therapy: WFL for tasks assessed/performed Overall Cognitive Status: Within Functional Limits for tasks assessed                                 General Comments: Pt is alert and following directions, however won't listen and makes excuses to  not try our recommendations      Exercises Other Exercises Other Exercises: Pt educated on how to sit to stand transfer with non weight bearing on RLE but patient refused to perform    General Comments General comments (skin integrity, edema, etc.): Pt is bruised on RLE leg in the calf and inner part of knee      Pertinent Vitals/Pain Pain Assessment: 0-10 Pain Score: 10-Worst pain ever Pain Location: R LE Pain Descriptors / Indicators: Shooting;Sharp;Tingling Pain Intervention(s): Limited activity within patient's tolerance;Monitored during session     Home Living                      Prior Function            PT Goals (current goals can now be found in the care plan section) Acute Rehab PT Goals Patient Stated Goal: To get stronger and be independent again PT Goal Formulation: With patient Time For Goal Achievement: 02/19/20 Potential to Achieve Goals: Fair Progress towards PT goals: Progressing toward goals    Frequency    Min 2X/week      PT Plan Current plan remains appropriate    Co-evaluation              AM-PAC PT "6 Clicks" Mobility   Outcome Measure  Help needed turning from your back to your side while in a flat bed without using bedrails?: A Lot Help needed moving from lying on your back to sitting on the side of a flat bed without using bedrails?: A Lot Help needed moving to and from a bed to a chair (including a wheelchair)?: A Lot Help needed standing up from a chair using your arms (e.g., wheelchair or bedside chair)?: Total Help needed to walk in hospital room?: Total Help needed climbing 3-5 steps with a railing? : Total 6 Click Score: 9    End of Session Equipment Utilized During Treatment: Gait belt Activity Tolerance: Patient limited by pain Patient left: in bed;with call bell/phone within reach;with bed alarm set;with family/visitor present Nurse Communication: Mobility status PT Visit Diagnosis: Muscle weakness (generalized) (M62.81);Difficulty in walking, not elsewhere classified (R26.2);Pain;Unsteadiness on feet (R26.81) Pain - Right/Left: Right Pain - part of body: Leg     Time: 3419-6222 PT Time Calculation (min) (ACUTE ONLY): 24 min  Charges:                         Katherine Basset, SPT Baker Pierini 02/13/2020, 3:16 PM

## 2020-02-13 NOTE — Progress Notes (Signed)
Chaplain came to floor and said they would finish advance directive tomorrow when brother is present.

## 2020-02-13 NOTE — Progress Notes (Addendum)
PROGRESS NOTE    [name]   WJX:[id number]  DOB: 08/14/1970  PCP: Remi Haggard, FNP    DOA: 01/23/2020 LOS: 21   Brief Narrative   [name] is an 49 y.o. male with a known history of polysubstance abuse including alcohol abuse who presented to the emergency room with acute onset of increased generalized weakness and a mechanical fall.  Alcohol level 296 on admission.  Patient was transferred to ICU on 6 September due to increased work of breathing and accessory muscle use.  He was withdrawing from alcohol required intubation and mechanical ventilation.   Events: 9/6 transferred to ICU for increased WOB, using accessory muscles to breathe with liver failure 9/7 remains on vent, liver failure, PICC line placed, brother updated 9/8 remains encephalopathic, liver failure, resp failure 9/10 remains on ventilator.  Long discussion and update with brother.  Sedatives switch to Precedex and fentanyl, continue CIWA scale 9/11 still with some agitation when sedation is lightened.  Intolerance to SBT due to agitation. 9/12 extubated 9/13 hypoxia improving 9/14 more alert awake, DT's improving, transferred out of ICU 9/15 more alert, DTs continue to improve 9/16 more alert, DTs improving still. Overnight a fib with rvr and fever, cxr showing pulmonary congestion. Treated with IV lasix and diltiazem drip and broad spectrum abx, dilt trip weaned off after a few hours.  9/19 more awake and alert, DTs improving 9/20 continued improvement     Assessment & Plan   Principal Problem:   Sepsis ([place]) Active Problems:   Acute blood loss anemia   GIB (gastrointestinal bleeding)   Transaminitis   Hyponatremia   Hypokalemia   Falls   Hip pain   Rhabdomyolysis   Hypovolemia   Hypotension   Alcohol abuse   Goals of care, counseling/discussion   Palliative care by specialist   PAF (paroxysmal atrial fibrillation) ([place])   Pure hypercholesterolemia   Right Ankle pain - pt  reported to prior attending that he "twisted" ankle getting into bed.  Now with some tenderness around lateral malleolus and pain on standing/ambulation.  Xray was negative for fracture.  Monitor.  Ice, analgesics PRN. - x-ray w/o signs fracture, will continue to monitor  New-onset atrial fibrillation w/ RVR - occurred night of 9/16, likely 2/2 fluidoverload. CXR with pulmonary congestion. Treated w/ iv lasix and dilt drip, dilt now off and has resumed home metoprolol, rate controlled appears to be in sinus. Edema much improved. TTE normal systolid/diastolic function.   - cont metoprolol - cardiology consulted and has signed off. No anticoagulation.  Family request of transfer to outside facility - resolved.  Family no longer pursuing transfer.    Fever - initially occurred in setting of a fib w/ rvr on 9/16. At that time infectious evaluation showed UA unremarkable, CXR without signs infection but CT showing multifocal opacities.  Has PICC line, but blood cultures negative x 5 days - final. Started on broad-spectrum abx which have since been stopped.   ?May be sequelae of DTs.  No significant ascites on abdominal u/s, no sig effusion on chest CT No travel or known history of mosquito or tick-borne exposure or illness. - s/p abx (vanc/zosyn (9/17 > 9/19), flagyl 9/17-9/18  - monitor clinically --9/21 - Repeat CXR.  No UA as pt asymptomatic.  Acute Hypoxic hypercapnic respiratory failure due to multiorgan liver failure and aspiration pneumonia.  Now extubated. Mild tachypnea continues.  Weaned off O2.  Incentive spirometry. Monitor for respiratory symptoms.  Acute alcohol hepatitis  with hepatic encephalopathy - stable - cont lactulose and bid ppi and rifaximin - GI advises no further interventions, supportive care, has signed off  Electrolytes --maintain K>4.0, Mg>2.0 --monitor and replace as needed  Macrocytic anemia - Hbg in 10s, stable. B12 and folate normal.  Macrocytosis due  to alcohol abuse.    Delirium tremens - resolved.  Required ICU, Precedex.  Continue thiamine.  Right gluteal hematoma - seen by ortho this hospitalization, advised no intervention.  With significant dependent bruising right leg. PVLs ordered by night team to eval for DVT, negative for DVT. Improving   Obesity - Body mass index is 32.31 kg/m.   Complicates overall care and prognosis.    DVT prophylaxis: heparin injection 5,000 Units Start: 02/01/20 1800 Place and maintain sequential compression device Start: 01/26/20 0813 SCDs Start: 01/23/20 2139   Diet:  Diet Orders (From admission, onward)    Start     Ordered   02/11/20 1324  Diet regular Room service appropriate? Yes with Assist; Fluid consistency: Thin  Diet effective now       Comments: Please cut chicken a little and add gravy on the side, potatoes.  Question Answer Comment  Room service appropriate? Yes with Assist   Fluid consistency: Thin      02/11/20 1325            Code Status: Full Code    Subjective 02/13/20    Patient seen this AM.  Reports feeling a little off this AM, attributes to low grade temp.  He denies hot sweats or cold chills, sore throat, cough or congestion, nausea/vomiting or diarrhea, dysuria or urinary frequency, abdominal pain.  Asks if liver damage is permanent.   Disposition Plan & Communication   Status is: Inpatient  Remains inpatient appropriate because:Unsafe d/c plan and low grade temperature requiring further monitoring.   Dispo: The patient is from: Home              Anticipated d/c is to: Home              Anticipated d/c date is: 1 day              Patient currently is not medically stable to d/c.        Family Communication: none at bedside, will attempt to call    Consults, Procedures, Significant Events    Antimicrobials:  Anti-infectives (From admission, onward)   Start     Dose/Rate Route Frequency Ordered Stop   02/08/20 1600  ceFEPIme (MAXIPIME) 2  g in sodium chloride 0.9 % 100 mL IVPB  Status:  Discontinued        2 g 200 mL/hr over 30 Minutes Intravenous Every 8 hours 02/08/20 0635 02/08/20 0713   02/08/20 1500  vancomycin (VANCOCIN) IVPB 1000 mg/200 mL premix  Status:  Discontinued        1,000 mg 200 mL/hr over 60 Minutes Intravenous Every 8 hours 02/08/20 0637 02/08/20 0714   02/08/20 1000  vancomycin (VANCOREADY) IVPB 1500 mg/300 mL        1,500 mg 150 mL/hr over 120 Minutes Intravenous  Once 02/08/20 0904 02/08/20 1331   02/08/20 0800  ceFEPIme (MAXIPIME) 2 g in sodium chloride 0.9 % 100 mL IVPB  Status:  Discontinued        2 g 200 mL/hr over 30 Minutes Intravenous Every 8 hours 02/08/20 0713 02/10/20 1342   02/08/20 0800  vancomycin (VANCOCIN) IVPB 1000 mg/200 mL premix  Status:  Discontinued  1,000 mg 200 mL/hr over 60 Minutes Intravenous Every 8 hours 02/08/20 0714 02/10/20 1342   02/08/20 0700  metroNIDAZOLE (FLAGYL) IVPB 500 mg  Status:  Discontinued        500 mg 100 mL/hr over 60 Minutes Intravenous Every 8 hours 02/08/20 0617 02/09/20 1021   02/08/20 0630  ceFEPIme (MAXIPIME) 2 g in sodium chloride 0.9 % 100 mL IVPB  Status:  Discontinued        2 g 200 mL/hr over 30 Minutes Intravenous  Once 02/08/20 0617 02/08/20 0713   02/08/20 0630  vancomycin (VANCOCIN) IVPB 1000 mg/200 mL premix  Status:  Discontinued        1,000 mg 200 mL/hr over 60 Minutes Intravenous  Once 02/08/20 0617 02/08/20 0714   02/05/20 2200  rifaximin (XIFAXAN) tablet 550 mg        550 mg Oral 2 times daily 02/05/20 1324     02/02/20 2200  rifaximin (XIFAXAN) tablet 550 mg  Status:  Discontinued        550 mg Per Tube 2 times daily 02/02/20 0928 02/05/20 1324   01/29/20 1530  rifaximin (XIFAXAN) tablet 550 mg  Status:  Discontinued        550 mg Oral 2 times daily 01/29/20 1500 02/02/20 0928   01/28/20 1515  ceFEPIme (MAXIPIME) 2 g in sodium chloride 0.9 % 100 mL IVPB        2 g 200 mL/hr over 30 Minutes Intravenous Every 8 hours  01/28/20 1504 02/04/20 0610   01/24/20 1600  cefTRIAXone (ROCEPHIN) 2 g in sodium chloride 0.9 % 100 mL IVPB  Status:  Discontinued        2 g 200 mL/hr over 30 Minutes Intravenous Every 24 hours 01/24/20 1520 01/28/20 1443   01/23/20 2045  cefTRIAXone (ROCEPHIN) 1 g in sodium chloride 0.9 % 100 mL IVPB        1 g 200 mL/hr over 30 Minutes Intravenous  Once 01/23/20 2042 01/23/20 2216       Significant Events: As above  Objective   Vitals:   02/13/20 0429 02/13/20 0822 02/13/20 1112 02/13/20 1144  BP: 136/74 139/76  123/74  Pulse:  70  67  Resp: _0 Temp: 99.7 F (37.6 C) 100.1 F (37.8 C) 99.3 F (37.4 C) 98.9 F (37.2 C)  TempSrc: Oral Oral Oral Oral  SpO2:  95%  96%  Weight: 93.6 kg     Height:        Intake/Output Summary (Last 24 hours) at 02/13/2020 1635 Last data filed at 02/13/2020 1432 Gross per 24 hour  Intake 360 ml  Output 600 ml  Net -240 ml   Filed Weights   02/11/20 0500 02/12/20 0426 02/13/20 0429  Weight: 99.6 kg 95.9 kg 93.6 kg    Physical Exam:  General exam: awake, alert, no acute distress Respiratory system: CTAB diminished bases, no wheezes, rales or rhonchi, normal respiratory effort. Cardiovascular system: normal S1/S2, RRR, no pedal edema.   Gastrointestinal system: soft, NT, ND, +bowel sounds. Central nervous system: A&O x3. no gross focal neurologic deficits, normal speech Extremities: moves all, no cyanosis, normal tone Skin: dry, intact, normal temperature, normal color Psychiatry: normal mood, congruent affect, judgement and insight appear normal  Labs   Data Reviewed: I have personally reviewed following labs and imaging studies  CBC: Recent Labs  Lab 02/07/20 0739 02/07/20 0739 02/08/20 0545 02/08/20 0545 02/09/20 0645 02/10/20 0437 02/11/20 0615 02/12/20 0552 02/13/20 0545  WBC  13.3*   < > 13.2*   < > 12.0* 12.9* 11.8* 12.4* 12.5*  NEUTROABS 9.3*  --  9.0*  --   --   --   --   --   --   HGB 9.6*   < > 10.6*    < > 10.5* 10.4* 11.1* 11.1* 10.8*  HCT 30.6*   < > 32.7*   < > 31.3* 32.1* 33.3* 33.5* 32.9*  MCV 108.5*   < > 104.8*   < > 103.6* 106.6* 103.4* 105.7* 106.1*  PLT 177   < > 167   < > 143* 138* 134* 143* 128*   < > = values in this interval not displayed.   Basic Metabolic Panel: Recent Labs  Lab 02/07/20 0739 02/07/20 0739 02/08/20 0545 02/08/20 0545 02/09/20 0645 02/10/20 0437 02/11/20 0615 02/12/20 0552 02/13/20 0545  NA 143   < > 143   < > 141 141 140 139 136  K 3.6   < > 3.4*   < > 3.2* 3.0* 3.5 3.6 3.8  CL 108   < > 108   < > 103 99 98 98 97*  CO2 24   < > 25   < > 28 32 34* 32 29  GLUCOSE 92   < > 101*   < > 95 89 92 94 89  BUN 22*   < > 23*   < > _0 CREATININE 0.68   < > 0.60*   < > 0.65 0.52* 0.72 0.53* 0.52*  CALCIUM 8.9   < > 9.0   < > 9.0 8.9 9.2 9.0 9.0  MG 1.6*   < > 1.6*   < > 1.5* 1.4* 1.7 1.5* 1.7  PHOS 3.8  3.9  --  4.3  --  4.0 3.6  --   --   --    < > = values in this interval not displayed.   GFR: Estimated Creatinine Clearance: 121.8 mL/min (A) (by C-G formula based on SCr of 0.52 mg/dL (L)). Liver Function Tests: Recent Labs  Lab 02/09/20 0645 02/10/20 0437 02/11/20 0615 02/12/20 0552 02/13/20 0545  AST 107* 97* 89* 81* 77*  ALT 70* 63* 57* 52* 49*  ALKPHOS 50 55 57 50 50  BILITOT 10.0* 8.4* 8.5* 8.4* 7.9*  PROT 6.6 6.4* 6.5 6.8 6.7  ALBUMIN 2.8* 2.7* 2.8* 2.8* 2.8*   No results for input(s): LIPASE, AMYLASE in the last 168 hours. No results for input(s): AMMONIA in the last 168 hours. Coagulation Profile: Recent Labs  Lab 02/08/20 0545  INR 1.5*   Cardiac Enzymes: No results for input(s): CKTOTAL, CKMB, CKMBINDEX, TROPONINI in the last 168 hours. BNP (last 3 results) No results for input(s): PROBNP in the last 8760 hours. HbA1C: No results for input(s): HGBA1C in the last 72 hours. CBG: Recent Labs  Lab 02/10/20 0304 02/10/20 0734 02/10/20 1120 02/10/20 1639 02/10/20 2011  GLUCAP 94 98 121* 89 98   Lipid  Profile: No results for input(s): CHOL, HDL, LDLCALC, TRIG, CHOLHDL, LDLDIRECT in the last 72 hours. Thyroid Function Tests: No results for input(s): TSH, T4TOTAL, FREET4, T3FREE, THYROIDAB in the last 72 hours. Anemia Panel: No results for input(s): VITAMINB12, FOLATE, FERRITIN, TIBC, IRON, RETICCTPCT in the last 72 hours. Sepsis Labs: Recent Labs  Lab 02/07/20 0739 02/08/20 0545  PROCALCITON 0.19  --   LATICACIDVEN  --  1.2    Recent Results (from the past 240 hour(s))  Culture, blood (x  2)     Status: None   Collection Time: 02/08/20  5:45 AM   Specimen: BLOOD  Result Value Ref Range Status   Specimen Description BLOOD LINE  Final   Special Requests   Final    BOTTLES DRAWN AEROBIC AND ANAEROBIC Blood Culture results may not be optimal due to an excessive volume of blood received in culture bottles   Culture   Final    NO GROWTH 5 DAYS Performed at [facility], [address], [place], [place] [address]    Report Status 02/13/2020 FINAL  Final  Culture, blood (x 2)     Status: None   Collection Time: 02/08/20  7:16 AM   Specimen: BLOOD  Result Value Ref Range Status   Specimen Description BLOOD RIGHT ANTECUBITAL  Final   Special Requests   Final    BOTTLES DRAWN AEROBIC AND ANAEROBIC Blood Culture adequate volume   Culture   Final    NO GROWTH 5 DAYS Performed at [facility], [address] [place], [place] [address]    Report Status 02/13/2020 FINAL  Final      Imaging Studies   DG Ankle 2 Views Right  Result Date: 02/12/2020 CLINICAL DATA:  Recent injury unknown origin, limited range of motion, best possible images obtained EXAM: RIGHT ANKLE - 2 VIEW COMPARISON:  10/10/2004 FINDINGS: There is no evidence of fracture, dislocation, or joint effusion. There is no evidence of arthropathy or other focal bone abnormality. Soft tissues are unremarkable. IMPRESSION: Negative. Electronically Signed   By: Lucrezia Europe M.D.   On: 02/12/2020 11:28      Medications   Scheduled Meds: . chlorhexidine gluconate (MEDLINE KIT)  15 mL Mouth Rinse BID  . Chlorhexidine Gluconate Cloth  6 each Topical Daily  . feeding supplement (ENSURE ENLIVE)  237 mL Oral TID BM  . folic acid  1 mg Oral Daily  . heparin  5,000 Units Subcutaneous Q8H  . lactulose  30 g Oral BID  . metoprolol tartrate  37.5 mg Oral BID  . multivitamin with minerals  1 tablet Oral Daily  . pantoprazole  40 mg Oral BID  . rifaximin  550 mg Oral BID  . sodium chloride flush  10-40 mL Intracatheter Q12H  . sodium chloride flush  10-40 mL Intracatheter Q12H  . thiamine  100 mg Oral Daily   Continuous Infusions: . sodium chloride Stopped (02/10/20 0859)  . sodium chloride         LOS: 21 days    Time spent: 30 minutes    Ezekiel Slocumb, DO [facility]  02/13/2020, 4:35 PM    If 7PM-7AM, please contact night-coverage. How to contact the [facility] Attending or Consulting provider [place] or covering provider during after hours [place], for this patient?    1. Check the care team in [facility] and look for a) attending/consulting TRH provider listed and b) the [facility] team listed 2. Log into [email or web address] and use [place]'s universal password to access. If you do not have the password, please contact the hospital operator. 3. Locate the [facility] provider you are looking for under [facility] and page to a number that you can be directly reached. 4. If you still have difficulty reaching the provider, please page the [facility] (Director on Call) for the Hospitalists listed on amion for assistance.

## 2020-02-13 NOTE — TOC Progression Note (Signed)
Transition of Care [facility]) - Progression Note    Patient Details  Name: [name] MRN: [id number] Date of Birth: 04-26-1971  Transition of [facility]) CM/SW Contact  Shawn Route, RN Phone Number: 02/13/2020, 2:38 PM  Clinical Narrative:     Visited with patient today, his girlfriend was today's visitor.  Patient reported that he was going to go to [place] with his brother to be seen by a specialist there.  Appointment in previous note.  He stated he does want the wheelchair as he doesn't feel safe without one.  Wheelchair will be delivered to room by [employer], paid for by [facility] Letter of Guarantee.  I was told by patient that at discharge his brother will pick him up.     Expected Discharge Plan: Home/Self Care Barriers to Discharge: Continued Medical Work up  Expected Discharge Plan and Services Expected Discharge Plan: Home/Self Care       Living arrangements for the past 2 months: Single Family Home                 DME Arranged: Wheelchair manual DME Agency: [employer] Date DME Agency Contacted: 02/13/20 Time DME Agency Contacted: [phone] Representative spoke with at DME Agency: Elease Hashimoto             Social Determinants of Health (SDOH) Interventions    Readmission Risk Interventions No flowsheet data found.

## 2020-02-13 NOTE — Hospital Course (Signed)
[name] is an 49 y.o. male with a known history of polysubstance abuse including alcohol abuse who presented to the emergency room with acute onset of increased generalized weakness and a mechanical fall.  Alcohol level 296 on admission.  Patient was transferred to ICU on 6 September due to increased work of breathing and accessory muscle use.  He was withdrawing from alcohol required intubation and mechanical ventilation.   Events: 9/6 transferred to ICU for increased WOB, using accessory muscles to breathe with liver failure 9/7 remains on vent, liver failure, PICC line placed, brother updated 9/8 remains encephalopathic, liver failure, resp failure 9/10 remains on ventilator.  Long discussion and update with brother.  Sedatives switch to Precedex and fentanyl, continue CIWA scale 9/11 still with some agitation when sedation is lightened.  Intolerance to SBT due to agitation. 9/12 extubated 9/13 hypoxia improving 9/14 more alert awake, DT's improving, transferred out of ICU 9/15 more alert, DTs continue to improve 9/16 more alert, DTs improving still. Overnight a fib with rvr and fever, cxr showing pulmonary congestion. Treated with IV lasix and diltiazem drip and broad spectrum abx, dilt trip weaned off after a few hours.  9/19 more awake and alert, DTs improving 9/20 continued improvement

## 2020-02-13 NOTE — Progress Notes (Signed)
Mobility Specialist - Progress Note   02/13/20 1512  Mobility  Activity Dangled on edge of bed;Turned to back (supine)  Range of Motion/Exercises Right leg;Left leg (Ankle pumps, straight leg raises, quad sets, hip isometrics)  Level of Assistance Minimal assist, patient does 75% or more  Assistive Device None  Distance Ambulated (ft) 0 ft  Mobility Response Tolerated well  Mobility performed by Mobility specialist  $Mobility charge 1 Mobility    Pre-mobility: 66 HR, 140/69 BP, 96% SpO2 Post-mobility: 66 HR, 150/70 BP, 95% SpO2   Pt was sitting EOB upon arrival with significant other present in room. Pt agreed to session. Pt c/o feeling tired d/t lack of sleep and pain in RLE (hip/calf/foot). Pt stated that he "is not able to tolerate any pressure on R leg d/t pain". Pt was able to transfer from EOB-supine with SBA. Once supine, pt performed bed exercises: straight leg raises, ankle pumps, hip isometrics (10x/leg), quad sets, and hip add/abd (5x/leg) with minA. Pt was informed of the importance of staying as mobile as possible during stay and encouraged to practice bed exercises each day. Overall, pt tolerated session well. Pt was left in bed with all needs in reach and bed alarm set.    Filiberto Pinks Mobility Specialist 02/13/20, 3:18 PM

## 2020-02-13 NOTE — Progress Notes (Signed)
   02/13/20 1500  Clinical Encounter Type  Visited With Patient and family together  Visit Type Initial  Referral From Nurse  Consult/Referral To Chaplain  Visited Pt to do an AD education. Pt informed me that it was his brother that wanted it. Brother will be back tomorrow and fill out the AD. Ch will follow-up.

## 2020-02-14 LAB — COMPREHENSIVE METABOLIC PANEL
ALT: 48 U/L — ABNORMAL HIGH (ref 0–44)
AST: 77 U/L — ABNORMAL HIGH (ref 15–41)
Albumin: 2.8 g/dL — ABNORMAL LOW (ref 3.5–5.0)
Alkaline Phosphatase: 49 U/L (ref 38–126)
Anion gap: 11 (ref 5–15)
BUN: 17 mg/dL (ref 6–20)
CO2: 28 mmol/L (ref 22–32)
Calcium: 8.6 mg/dL — ABNORMAL LOW (ref 8.9–10.3)
Chloride: 100 mmol/L (ref 98–111)
Creatinine, Ser: 0.52 mg/dL — ABNORMAL LOW (ref 0.61–1.24)
GFR calc Af Amer: >60 mL/min (ref >60)
GFR calc non Af Amer: >60 mL/min (ref >60)
Glucose, Bld: 91 mg/dL (ref 70–99)
Potassium: 3.7 mmol/L (ref 3.5–5.1)
Sodium: 139 mmol/L (ref 135–145)
Total Bilirubin: 7.8 mg/dL — ABNORMAL HIGH (ref 0.3–1.2)
Total Protein: 6.5 g/dL (ref 6.5–8.1)

## 2020-02-14 LAB — CBC
HCT: 32.9 % — ABNORMAL LOW (ref 39.0–52.0)
Hemoglobin: 10.9 g/dL — ABNORMAL LOW (ref 13.0–17.0)
MCH: 35.4 pg — ABNORMAL HIGH (ref 26.0–34.0)
MCHC: 33.1 g/dL (ref 30.0–36.0)
MCV: 106.8 fL — ABNORMAL HIGH (ref 80.0–100.0)
Platelets: 131 10*3/uL — ABNORMAL LOW (ref 150–400)
RBC: 3.08 MIL/uL — ABNORMAL LOW (ref 4.22–5.81)
RDW: 24.6 % — ABNORMAL HIGH (ref 11.5–15.5)
WBC: 10.9 10*3/uL — ABNORMAL HIGH (ref 4.0–10.5)
nRBC: 0 % (ref 0.0–0.2)

## 2020-02-14 LAB — MAGNESIUM: Magnesium: 1.5 mg/dL — ABNORMAL LOW (ref 1.7–2.4)

## 2020-02-14 MED ORDER — FOLIC ACID 1 MG PO TABS
1.0000 mg | ORAL_TABLET | Freq: Every day | ORAL | 2 refills | Status: DC
Start: 2020-02-15 — End: 2021-06-07

## 2020-02-14 MED ORDER — RIFAXIMIN 550 MG PO TABS
550.0000 mg | ORAL_TABLET | Freq: Two times a day (BID) | ORAL | 2 refills | Status: DC
Start: 2020-02-14 — End: 2021-06-07

## 2020-02-14 MED ORDER — THIAMINE HCL 100 MG PO TABS
100.0000 mg | ORAL_TABLET | Freq: Every day | ORAL | 2 refills | Status: DC
Start: 2020-02-15 — End: 2021-06-07

## 2020-02-14 MED ORDER — MAGNESIUM SULFATE 2 GM/50ML IV SOLN
2.0000 g | Freq: Once | INTRAVENOUS | Status: AC
  Administered 2020-02-14: 2 g via INTRAVENOUS
  Filled 2020-02-14: qty 50

## 2020-02-14 MED ORDER — METOPROLOL TARTRATE 37.5 MG PO TABS: 37.5000 mg | ORAL_TABLET | Freq: Two times a day (BID) | ORAL | 2 refills | Status: DC

## 2020-02-14 MED ORDER — ADULT MULTIVITAMIN W/MINERALS CH: 1.0000 | ORAL_TABLET | Freq: Every day | ORAL | Status: DC

## 2020-02-14 MED ORDER — PANTOPRAZOLE SODIUM 40 MG PO TBEC
40.0000 mg | DELAYED_RELEASE_TABLET | Freq: Two times a day (BID) | ORAL | 2 refills | Status: DC
Start: 2020-02-14 — End: 2021-06-07

## 2020-02-14 MED ORDER — LACTULOSE 10 GM/15ML PO SOLN: 30.0000 g | Freq: Two times a day (BID) | ORAL | 2 refills | Status: DC

## 2020-02-14 NOTE — Discharge Summary (Signed)
Physician Discharge Summary  [name] RUE:[id number] DOB: 02-Oct-1970 DOA: 01/23/2020  PCP: Armando Gang, FNP  Admit date: 01/23/2020 Discharge date: 02/14/2020  Admitted From: home Disposition:  Home   Recommendations for Outpatient Follow-up:  1. Follow up with PCP in 1-2 weeks 2. Please obtain BMP/CBC in one week   Home Health: No (moving to [place]) Equipment/Devices: wheelchair   Discharge Condition: Stable  CODE STATUS: Full  Diet recommendation: Heart Healthy    Discharge Diagnoses: Principal Problem:   Sepsis (HCC) Active Problems:   Acute blood loss anemia   GIB (gastrointestinal bleeding)   Transaminitis   Hyponatremia   Hypokalemia   Falls   Hip pain   Rhabdomyolysis   Hypovolemia   Hypotension   Alcohol abuse   Goals of care, counseling/discussion   Palliative care by specialist   PAF (paroxysmal atrial fibrillation) (HCC)   Pure hypercholesterolemia    Summary of HPI and Hospital Course:  [name] is an 49 y.o. male with a known history of polysubstance abuse including alcohol abuse who presented to the emergency room with acute onset of increased generalized weakness and a mechanical fall.  Alcohol level 296 on admission.  Patient was transferred to ICU on 6 September due to increased work of breathing and accessory muscle use.  He was withdrawing from alcohol required intubation and mechanical ventilation.   Events: 9/6 transferred to ICU for increased WOB, using accessory muscles to breathe with liver failure 9/7 remains on vent, liver failure, PICC line placed, brother updated 9/8 remains encephalopathic, liver failure, resp failure 9/10 remains on ventilator.  Long discussion and update with brother.  Sedatives switch to Precedex and fentanyl, continue CIWA scale 9/11 still with some agitation when sedation is lightened.  Intolerance to SBT due to agitation. 9/12 extubated 9/13 hypoxia improving 9/14 more alert awake, DT's  improving, transferred out of ICU 9/15 more alert, DTs continue to improve 9/16 more alert, DTs improving still. Overnight a fib with rvr and fever, cxr showing pulmonary congestion. Treated with IV lasix and diltiazem drip and broad spectrum abx, dilt trip weaned off after a few hours.  9/19 more awake and alert, DTs improving 9/20 continued improvement   Due to the severity of illness and prolonged recovery, palliative was consulted, patient was eventually made DNR status.   Brother is surrogate decision maker  Right Ankle pain - pt reported to prior attending that he "twisted" ankle getting into bed.  Now with some tenderness around lateral malleolus and pain on standing/ambulation.  Xray was negative for fracture or dislocation.  Ice, analgesics PRN.  New-onset atrial fibrillation w/ RVR - Resolved. Occurred night of 9/16,likely 2/2 fluidoverload. CXR with pulmonary congestion. Treated w/ iv lasix and dilt drip.  Subsequently resumed home metoprolol.  Converted to normal sinus.   TTE normal systolid/diastolic function.   Continue metoprolol Cardiology was consulted.  No anticoagulation recommended due to low CHA2DS2-VASc score, alcohol use, frequent falls.   Fever Resolved. - initially occurred in setting of a fib w/ rvr on 9/16. At that time infectious evaluation showed UA unremarkable, CXR without signs infection but CT showing multifocal opacities.  Has PICC line, but blood cultures negative x 5 days - final. Started on empiric broad-spectrum abx which have since been stopped.   Possibly sequelae of DTs.  No significant ascites on abdominal u/s, no sig effusion on chest CT No travel or known history of mosquito or tick-borne exposure or illness. - s/p abx (vanc/zosyn (9/17 >9/19),  flagyl 9/17-9/18  - monitor clinically --9/21 - Repeat CXR.  No UA as pt asymptomatic.   Acute Hypoxic hypercapnic respiratory failure due to multiorgan liver failure and aspiration pneumonia.   Required intubation in ICU.   Weanedoff O2 and maintains O2 sats on room air.  Acute alcohol hepatitis with hepatic encephalopathy - stable.  He refuses lactulose at times due to limited mobility, and does get confused when he does not take it.  He and brother were counseled on importance of taking lactulose extensively and they agreed and expressed understanding. Cont on lactulose and Rifaximin GI was consulted and advises no further interventions, supportive care, signed off.  Macrocytic anemia - Hbg in 10s, stable. B12 and folate normal.  Macrocytosis due to alcohol abuse.    Delirium tremens - resolved.  Required ICU, Precedex.  Continue thiamine and folate.  Right gluteal hematoma - due to fall prior to admission.  Seen by ortho who advised no intervention.  Has significant dependent ecchymosis down the right leg.  PVLs ordered by night team to eval for DVT, negative for DVT.   Obesity - Body mass index is 32.31 kg/m.   Complicates overall care and prognosis.   Discharge Instructions   Discharge Instructions    Call MD for:  extreme fatigue   Complete by: As directed    Call MD for:  persistant dizziness or light-headedness   Complete by: As directed    Call MD for:  persistant nausea and vomiting   Complete by: As directed    Call MD for:  temperature >100.4   Complete by: As directed    Diet - low sodium heart healthy   Complete by: As directed    Discharge instructions   Complete by: As directed    Take all medications exactly as prescribed.  Lactulose is very important - the goal is at least 3 soft/loose stools per [name].  If you are having enough bowel movements, your body accumulates ammonia and you become sleepy, lethargic and confused.  Critically important for your health and liver recovery that you avoid alcohol completely.   Increase activity slowly   Complete by: As directed      Allergies as of 02/14/2020   No Known Allergies     Medication List     STOP taking these medications   doxycycline 100 MG capsule Commonly known as: VIBRAMYCIN   metoprolol succinate 25 MG 24 hr tablet Commonly known as: TOPROL-XL     TAKE these medications   atorvastatin 20 MG tablet Commonly known as: LIPITOR Take 20 mg by mouth at bedtime.   folic acid 1 MG tablet Commonly known as: FOLVITE Take 1 tablet (1 mg total) by mouth daily. Start taking on: February 15, 2020   lactulose 10 GM/15ML solution Commonly known as: CHRONULAC Take 45 mLs (30 g total) by mouth 2 (two) times daily.   Metoprolol Tartrate 37.5 MG Tabs Take 37.5 mg by mouth 2 (two) times daily.   multivitamin with minerals Tabs tablet Take 1 tablet by mouth daily. Start taking on: February 15, 2020   pantoprazole 40 MG tablet Commonly known as: PROTONIX Take 1 tablet (40 mg total) by mouth 2 (two) times daily.   rifaximin 550 MG Tabs tablet Commonly known as: XIFAXAN Take 1 tablet (550 mg total) by mouth 2 (two) times daily.   thiamine 100 MG tablet Take 1 tablet (100 mg total) by mouth daily. Start taking on: February 15, 2020  Durable Medical Equipment  (From admission, onward)         Start     Ordered   02/13/20 0943  For home use only DME lightweight manual wheelchair with seat cushion  Once       Comments: Patient suffers from debility which impairs their ability to perform daily activities like {ambulating in the home.  A walker will not resolve  issue with performing activities of daily living. A wheelchair will allow patient to safely perform daily activities. Patient is not able to propel themselves in the home using a standard weight wheelchair due to weakness.   Patient can self propel in the lightweight wheelchair. Length of need 6 months. Accessories: elevating leg rests (ELRs), wheel locks, extensions and anti-tippers.   02/13/20 0944          No Known  Allergies  Consultations:  Gastroenterology  PCCM  Cardiology    Procedures/Studies: DG Ankle 2 Views Right  Result Date: 02/12/2020 CLINICAL DATA:  Recent injury unknown origin, limited range of motion, best possible images obtained EXAM: RIGHT ANKLE - 2 VIEW COMPARISON:  10/10/2004 FINDINGS: There is no evidence of fracture, dislocation, or joint effusion. There is no evidence of arthropathy or other focal bone abnormality. Soft tissues are unremarkable. IMPRESSION: Negative. Electronically Signed   By: Corlis Leak M.D.   On: 02/12/2020 11:28   DG Abd 1 View  Result Date: 01/28/2020 CLINICAL DATA:  Orogastric tube placement EXAM: ABDOMEN - 1 VIEW COMPARISON:  None. FINDINGS: A nasogastric tube is present with side port in the proximal stomach and distal port in the stomach body. Lower thoracic and upper lumbar spondylosis. Mild vascular accentuation in the lung bases. IMPRESSION: 1. Nasogastric tube is present with side port in the proximal stomach body. 2. Mild vascular accentuation in the lung bases. Electronically Signed   By: Gaylyn Rong M.D.   On: 01/28/2020 15:04   CT CHEST WO CONTRAST  Result Date: 02/08/2020 CLINICAL DATA:  Pleural effusion EXAM: CT CHEST WITHOUT CONTRAST TECHNIQUE: Multidetector CT imaging of the chest was performed following the standard protocol without IV contrast. COMPARISON:  None. FINDINGS: Cardiovascular: Moderate multi-vessel coronary artery calcification. Global cardiac size within normal limits. No pericardial effusion. The central pulmonary arteries are enlarged in keeping with changes of pulmonary arterial hypertension. The thoracic aorta is unremarkable save for bovine arch anatomy. Mediastinum/Nodes: No pathologic thoracic adenopathy. Soft tissue infiltration within the anterior mediastinum may represent rebound thymic hyperplasia in the setting of acute infection. No discrete anterior mediastinal mass identified. Esophagus unremarkable.  Visualized thyroid unremarkable. Lungs/Pleura: There are scattered nodular infiltrates noted within the right upper lobe, lower lobes, and basilar lingula likely infectious or inflammatory in the acute setting. Small bilateral pleural effusions are present with associated mild bibasilar compressive atelectasis. No pneumothorax. Central airways are widely patent. Upper Abdomen: There is recanalization of the umbilical vein as well as gastroesophageal varices identified in keeping with changes of portal venous hypertension possibly related to hepatocellular disease. This is not well assessed on this examination. Musculoskeletal: No chest wall mass or suspicious bone lesions identified. IMPRESSION: 1. Multifocal pulmonary infiltrates most in keeping with acute atypical infection the appropriate clinical setting. 2. Moderate coronary artery calcification. Morphologic changes in keeping with pulmonary arterial hypertension. 3. Small bilateral pleural effusions. 4. Findings in keeping with portal venous hypertension, possibly related to underlying hepatocellular disease. Electronically Signed   By: Helyn Numbers MD   On: 02/08/2020 19:37   CT PELVIS WO CONTRAST  Result Date: 01/24/2020 CLINICAL DATA:  Pain, question fracture EXAM: CT PELVIS WITHOUT CONTRAST TECHNIQUE: Multidetector CT imaging of the pelvis was performed following the standard protocol without intravenous contrast. COMPARISON:  None. FINDINGS: Urinary Tract: The visualized distal ureters and bladder appear unremarkable. Bowel: No bowel wall thickening, distention or surrounding inflammation identified within the pelvis. Vascular/Lymphatic: No enlarged pelvic lymph nodes identified. No significant vascular findings. Reproductive: The prostate is unremarkable. Other: Bilateral subcutaneous edema seen around the hips. Musculoskeletal: No fracture or dislocation. There is moderate bilateral hip osteoarthritis with superior joint space loss and marginal  osteophyte formation. There is a large heterogeneous hyperdense areas seen throughout the right gluteal musculature with expansion most notable within the gluteus medius muscle belly, likely intramuscular hematoma. There is also a small subcutaneous hyperdense collections seen overlying the left gluteal musculature, likely superficial hematomas. IMPRESSION: No acute osseous abnormality. Large intramuscular hematoma within the right gluteal musculature. Overlying subcutaneous edema and skin thickening. superficial subcutaneous small hematoma seen overlying the left gluteal musculature. Electronically Signed   By: Jonna Clark M.D.   On: 01/24/2020 02:45   DG Pelvis Portable  Result Date: 02/03/2020 CLINICAL DATA:  Fall. EXAM: PORTABLE PELVIS 1-2 VIEWS COMPARISON:  None. FINDINGS: Hips are located.  No pelvic fracture or sacral fracture. Radiodense ribbon like foreign body projects over the pubic symphysis. IMPRESSION: 1. No pelvic fracture. 2. Radiopaque foreign body projects over the pubic symphysis. Electronically Signed   By: Genevive Bi M.D.   On: 02/03/2020 05:49   MR HIP RIGHT WO CONTRAST  Result Date: 01/27/2020 CLINICAL DATA:  Right gluteal hematoma, for further characterization. EXAM: MR OF THE RIGHT HIP WITHOUT CONTRAST TECHNIQUE: Multiplanar, multisequence MR imaging was performed. No intravenous contrast was administered. COMPARISON:  CT pelvis 01/24/2020 FINDINGS: The patient terminated the exam following the completion of coronal T1 and T2 weighted images. These images are adversely affected by motion artifact. The patient did not allow completion of the exam, and due to these factors, diagnostic sensitivity and specificity of the examination is reduced. Bones: Accentuated T2 signal and reduced T1 signal in the pubic bodies bilaterally suspicious for osteitis pubis. No surrounding fluid collection to suggest infection. SI joints grossly unremarkable. No observed fracture. Articular cartilage  and labrum Articular cartilage: Difficult to assess given the lack of small field-of-view images and the motion artifact. Labrum: Today's exam is not diagnostic for assessment of the labrum. Joint or bursal effusion Joint effusion:  Absent Bursae: No bursitis. Muscles and tendons Muscles and tendons: Heterogeneous hematoma primarily in the right gluteus medius muscle, but also tracking up through the sciatic notch along the right piriformis muscle, measuring approximately 22.1 by 13.5 by 8.5 cm (volume = 1300 cm^3). By my estimates this is similar in volume to the prior CT from 01/24/2020, indicating no significant change over the 12 hours between the 2 studies. The hematoma appears to moderately impinge on the right sciatic nerve at and distal to the sciatic notch. Low-level asymmetric edema in the right gluteus maximus muscle. Trace edema along the right hip adductor musculature. Other findings Miscellaneous: We partially include apparent hematoma in the subcutaneous tissues lateral to the left gluteus musculature, better shown on the CT examination. There is subcutaneous edema along both buttock and flank regions. Mild edema in the retroperitoneum, presacral region, and along the right pelvic sidewall. IMPRESSION: 1. Heterogeneous hematoma primarily in the right gluteus medius muscle, but also tracking up through the sciatic notch along the right piriformis muscle. Similar volume to prior  CT about 1300 cubic cm. The hematoma appears to moderately impinge on the right sciatic nerve at and distal to the sciatic notch. 2. Low-level edema in the right gluteus maximus and right hip adductor musculature. 3. Subcutaneous edema along both buttock and flank regions. 4. 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 5. Subcutaneous hematoma lateral to the left gluteal musculature, better shown on the prior CT. 6. Mild edema in the retroperitoneum, presacral region, and along the right pelvic sidewall. 7. The patient terminated the exam  following the completion of the exam, and due to motion artifact. The patient did not allow completion of the exam, and due to these factors, diagnostic sensitivity and specificity of the examination is reduced. Electronically Signed   By: Gaylyn Rong M.D.   On: 01/27/2020 15:44   US Abdomen Limited  Result Date: 02/08/2020 CLINICAL DATA:  Ascites. EXAM: LIMITED ABDOMEN ULTRASOUND FOR ASCITES TECHNIQUE: Limited ultrasound survey for ascites was performed in all four abdominal quadrants. COMPARISON:  None. FINDINGS: No ascites is noted in any quadrant of the abdomen. IMPRESSION: No ascites is noted. Electronically Signed   By: Lupita Raider M.D.   On: 02/08/2020 14:00   US Venous Img Lower Bilateral (DVT)  Result Date: 02/06/2020 CLINICAL DATA:  Bilateral lower extremity pain and edema. Recent fall. Evaluate for DVT. EXAM: BILATERAL LOWER EXTREMITY VENOUS DOPPLER ULTRASOUND TECHNIQUE: Gray-scale sonography with graded compression, as well as color Doppler and duplex ultrasound were performed to evaluate the lower extremity deep venous systems from the level of the common femoral vein and including the common femoral, femoral, profunda femoral, popliteal and calf veins including the posterior tibial, peroneal and gastrocnemius veins when visible. The superficial great saphenous vein was also interrogated. Spectral Doppler was utilized to evaluate flow at rest and with distal augmentation maneuvers in the common femoral, femoral and popliteal veins. COMPARISON:  None. FINDINGS: RIGHT LOWER EXTREMITY Common Femoral Vein: No evidence of thrombus. Normal compressibility, respiratory phasicity and response to augmentation. Saphenofemoral Junction: No evidence of thrombus. Normal compressibility and flow on color Doppler imaging. Profunda Femoral Vein: No evidence of thrombus. Normal compressibility and flow on color Doppler imaging. Femoral Vein: No evidence of thrombus. Normal compressibility, respiratory  phasicity and response to augmentation. Popliteal Vein: No evidence of thrombus. Normal compressibility, respiratory phasicity and response to augmentation. Calf Veins: No evidence of thrombus. Normal compressibility and flow on color Doppler imaging. Superficial Great Saphenous Vein: No evidence of thrombus. Normal compressibility. Venous Reflux:  None. Other Findings:  None. LEFT LOWER EXTREMITY Common Femoral Vein: No evidence of thrombus. Normal compressibility, respiratory phasicity and response to augmentation. Saphenofemoral Junction: No evidence of thrombus. Normal compressibility and flow on color Doppler imaging. Profunda Femoral Vein: No evidence of thrombus. Normal compressibility and flow on color Doppler imaging. Femoral Vein: No evidence of thrombus. Normal compressibility, respiratory phasicity and response to augmentation. Popliteal Vein: No evidence of thrombus. Normal compressibility, respiratory phasicity and response to augmentation. Calf Veins: No evidence of thrombus. Normal compressibility and flow on color Doppler imaging. Superficial Great Saphenous Vein: No evidence of thrombus. Normal compressibility. Venous Reflux:  None. Other Findings: Note is made of an approximately 22.1 x 5.7 x 3.0 cm minimally complex fluid collection extending from the left popliteal fossa. IMPRESSION: 1. No evidence of DVT within either lower extremity. 2. Note made of an approximately 22.1 cm minimally complex fluid collection extending from the left popliteal fossa, nonspecific with differential considerations including a leaking Baker's cyst though given provided history of recent fall conceivably  a hematoma could have a similar appearance. Clinical correlation is advised. Electronically Signed   By: Simonne Come M.D.   On: 02/06/2020 11:50   DG Chest Port 1 View  Result Date: 02/13/2020 CLINICAL DATA:  Fevers EXAM: PORTABLE CHEST 1 VIEW COMPARISON:  02/08/2020 FINDINGS: Cardiac shadow is stable. The lungs  are well aerated bilaterally. The overall appearance has improved when compared prior study with reduction in the degree of central vascular congestion and edema. No effusion is seen. No bony abnormality is noted. IMPRESSION: Interval improvement in vascular congestion and edema when compared with the prior study. Electronically Signed   By: Alcide Clever M.D.   On: 02/13/2020 19:16   DG Chest Port 1 View  Result Date: 02/08/2020 CLINICAL DATA:  Fever EXAM: PORTABLE CHEST 1 VIEW COMPARISON:  02/01/2020 FINDINGS: Cardiomegaly, vascular congestion. Bilateral hilar prominence, favor vascular. Diffuse interstitial prominence throughout the lungs, likely edema. No effusions. No acute bony abnormality. IMPRESSION: Cardiomegaly with vascular congestion and interstitial prominence, likely mild edema. Bilateral hilar prominence, favor vascular. Electronically Signed   By: Charlett Nose M.D.   On: 02/08/2020 05:23   DG Chest Port 1 View  Result Date: 02/01/2020 CLINICAL DATA:  Acute respiratory failure EXAM: PORTABLE CHEST 1 VIEW COMPARISON:  01/28/2020 FINDINGS: Endotracheal and enteric tubes are unchanged in position. Shallow inspiration. Cardiac enlargement with pulmonary vascular congestion. No definite edema or consolidation. No pleural effusions. No pneumothorax. IMPRESSION: Cardiac enlargement with pulmonary vascular congestion. No edema or consolidation. Electronically Signed   By: Burman Nieves M.D.   On: 02/01/2020 01:58   DG Chest Port 1 View  Result Date: 01/28/2020 CLINICAL DATA:  Endotracheal tube placement for respiratory failure. Acute toxic metabolic encephalopathy. Acute liver failure complicated by rhabdomyolysis. EXAM: PORTABLE CHEST 1 VIEW COMPARISON:  None. FINDINGS: Endotracheal tube tip is 2.7 cm above the carina, satisfactorily position. Nasogastric tube side port is in the vicinity of the gastric cardia with distal port in the stomach body. Mild enlargement of the cardiopericardial  silhouette noted with indistinct pulmonary vasculature and cephalization of blood flow suspicious for pulmonary venous hypertension. Borderline appearance for interstitial edema. IMPRESSION: 1. Endotracheal tube tip is 2.7 cm above the carina, satisfactorily positioned. 2. Mild enlargement of the cardiopericardial silhouette with suspected pulmonary venous hypertension and borderline appearance for interstitial edema. Electronically Signed   By: Gaylyn Rong M.D.   On: 01/28/2020 15:08   ECHOCARDIOGRAM COMPLETE  Result Date: 02/08/2020    ECHOCARDIOGRAM REPORT   Patient Name:   [name] Date of Exam: 02/08/2020 Medical Rec #:  [id number]         Height:       67.0 in Accession #:    [id number]        Weight:       258.8 lb Date of Birth:  June 15, 1970        BSA:          2.256 m Patient Age:    48 years          BP:           136/77 mmHg Patient Gender: M                 HR:           77 bpm. Exam Location:  [facility] Procedure: 2D Echo, Cardiac Doppler and Color Doppler Indications:     Atrial Fibrillation 427.31  History:         Patient has no prior  history of Echocardiogram examinations.                  Alcohol abuse, sepsis.  Sonographer:     Cristela BlueJerry Hege RDCS (AE) Referring Phys:  [id number] Dois DavenportHRISTOPHER RONALD [facility] RegionBERGE Diagnosing Phys: Julien Nordmannimothy Gollan MD IMPRESSIONS  1. Left ventricular ejection fraction, by estimation, is 60 to 65%. The left ventricle has normal function. The left ventricle has no regional wall motion abnormalities. Left ventricular diastolic parameters were normal.  2. Right ventricular systolic function is normal. The right ventricular size is normal. There is mildly elevated pulmonary artery systolic pressure. The estimated right ventricular systolic pressure is 40.0 mmHg.  3. Left atrial size was mildly dilated.  4. Mild mitral valve regurgitation. FINDINGS  Left Ventricle: Left ventricular ejection fraction, by estimation, is 60 to 65%. The left ventricle has normal function. The left  ventricle has no regional wall motion abnormalities. The left ventricular internal cavity size was normal in size. There is  no left ventricular hypertrophy. Left ventricular diastolic parameters were normal. Right Ventricle: The right ventricular size is normal. No increase in right ventricular wall thickness. Right ventricular systolic function is normal. There is mildly elevated pulmonary artery systolic pressure. The tricuspid regurgitant velocity is 2.96  m/s, and with an assumed right atrial pressure of 5 mmHg, the estimated right ventricular systolic pressure is 40.0 mmHg. Left Atrium: Left atrial size was mildly dilated. Right Atrium: Right atrial size was normal in size. Pericardium: There is no evidence of pericardial effusion. Mitral Valve: The mitral valve is normal in structure. Mild mitral valve regurgitation. No evidence of mitral valve stenosis. Tricuspid Valve: The tricuspid valve is normal in structure. Tricuspid valve regurgitation is mild . No evidence of tricuspid stenosis. Aortic Valve: The aortic valve is normal in structure. Aortic valve regurgitation is not visualized. No aortic stenosis is present. Aortic valve mean gradient measures 11.0 mmHg. Aortic valve peak gradient measures 19.0 mmHg. Aortic valve area, by VTI measures 3.09 cm. Pulmonic Valve: The pulmonic valve was normal in structure. Pulmonic valve regurgitation is not visualized. No evidence of pulmonic stenosis. Aorta: The aortic root is normal in size and structure. Venous: The inferior vena cava is normal in size with greater than 50% respiratory variability, suggesting right atrial pressure of 3 mmHg. IAS/Shunts: No atrial level shunt detected by color flow Doppler.  LEFT VENTRICLE PLAX 2D LVIDd:         4.83 cm  Diastology LVIDs:         3.13 cm  LV e' medial:    10.10 cm/s LV PW:         1.26 cm  LV E/e' medial:  12.1 LV IVS:        1.28 cm  LV e' lateral:   13.90 cm/s LVOT diam:     2.00 cm  LV E/e' lateral: 8.8 LV SV:          123 LV SV Index:   55 LVOT Area:     3.14 cm  LEFT ATRIUM              Index       RIGHT ATRIUM           Index LA diam:        4.70 cm  2.08 cm/m  RA Area:     23.30 cm LA Vol (A2C):   113.0 ml 50.08 ml/m RA Volume:   62.40 ml  27.66 ml/m LA Vol (A4C):   96.2 ml  42.64 ml/m LA Biplane Vol: 107.0 ml 47.42 ml/m  AORTIC VALVE                    PULMONIC VALVE AV Area (Vmax):    2.81 cm     PV Vmax:        0.92 m/s AV Area (Vmean):   2.69 cm     PV Peak grad:   3.4 mmHg AV Area (VTI):     3.09 cm     RVOT Peak grad: 6 mmHg AV Vmax:           218.00 cm/s AV Vmean:          153.000 cm/s AV VTI:            0.400 m AV Peak Grad:      19.0 mmHg AV Mean Grad:      11.0 mmHg LVOT Vmax:         195.00 cm/s LVOT Vmean:        131.000 cm/s LVOT VTI:          0.393 m LVOT/AV VTI ratio: 0.98  AORTA Ao Root diam: 2.70 cm MITRAL VALVE                TRICUSPID VALVE MV Area (PHT): 3.23 cm     TR Peak grad:   35.0 mmHg MV Decel Time: 235 msec     TR Vmax:        296.00 cm/s MV E velocity: 122.00 cm/s MV A velocity: 73.70 cm/s   SHUNTS MV E/A ratio:  1.66         Systemic VTI:  0.39 m                             Systemic Diam: 2.00 cm Julien Nordmann MD Electronically signed by Julien Nordmann MD Signature Date/Time: 02/08/2020/1:50:54 PM    Final    DG Hip Unilat W or Wo Pelvis 2-3 Views Right  Result Date: 01/23/2020 CLINICAL DATA:  Right hip pain post fall EXAM: DG HIP (WITH OR WITHOUT PELVIS) 2-3V RIGHT COMPARISON:  None. FINDINGS: There is marked lateral soft tissue swelling of the right hip. Suspicious for overlying contusive change and/or hematoma. There is a questionable lucency along the posterolateral aspect of the right femoral head neck junction which could reflect a nondisplaced fracture or some projectional periarticular spurring. Additional suspicious lucency is seen along the junction of the left inferior pubic ramus and pubic body best seen on the lateral hip radiograph. Could correlate for point  tenderness in this location. There is a background of diffuse degenerative change in the lower spine, hips and pelvis. Enthesopathic changes noted as well at the iliac crest, ischial tuberosities and lesser trochanters bilaterally. IMPRESSION: 1. Marked lateral soft tissue swelling of the right hip. Suspicious for overlying contusive change and/or hematoma. 2. Questionable lucency along the posterolateral aspect of the right femoral head neck junction which could reflect a nondisplaced fracture or some projectional periarticular spurring. 3. Additional suspicious lucency along the junction of the left inferior pubic ramus and pubic body best seen on the lateral radiograph. 4. Given these latter findings, further evaluation with a bony pelvis CT could be obtained for more definitive assessment. Electronically Signed   By: Kreg Shropshire M.D.   On: 01/23/2020 21:55   [place] EKG SITE RITE  Result Date: 01/28/2020 If Site Rite image not attached, placement could not be  confirmed due to current cardiac rhythm.  US Abdomen Limited RUQ  Result Date: 01/23/2020 CLINICAL DATA:  Elevated liver enzymes EXAM: ULTRASOUND ABDOMEN LIMITED RIGHT UPPER QUADRANT COMPARISON:  None. FINDINGS: Gallbladder: No gallstones or wall thickening visualized. No sonographic Murphy sign noted by sonographer. Common bile duct: Diameter: 5.9 mm Liver: Increased echogenicity seen throughout the liver parenchyma. No focal hepatic lesion. No biliary ductal dilatation. The portal vein is patent, with reversal of flow however and recanalized umbilical vein. Other: None. IMPRESSION: Hepatic steatosis Reversal of flow in the portal vein with findings suggestive of portal hypertension Electronically Signed   By: Jonna Clark M.D.   On: 01/23/2020 21:21       Subjective: Patient seen at bedside, says ready to get out of here.  He reports R ankle pain ongoing but getting little better.  Still hurts to bear weight on it.  He denies any acute other  complaints.   Discharge Exam: Vitals:   02/14/20 0515 02/14/20 0727  BP: 139/77 (!) 146/72  Pulse: 67 67  Resp: 17 18  Temp: 98.7 F (37.1 C) 99 F (37.2 C)  SpO2: 94% 95%   Vitals:   02/13/20 1930 02/14/20 0500 02/14/20 0515 02/14/20 0727  BP: 125/73 139/77 139/77 (!) 146/72  Pulse: 76 67 67 67  Resp: 18  17 18   Temp: 98.2 F (36.8 C) 98.7 F (37.1 C) 98.7 F (37.1 C) 99 F (37.2 C)  TempSrc: Oral Oral Oral Oral  SpO2: 95% 94% 94% 95%  Weight:   93.9 kg   Height:        General: Pt is alert, awake, not in acute distress, obese Cardiovascular: RRR, S1/S2 +, no rubs, no gallops Respiratory: CTA bilaterally but diminished, no wheezing, no rhonchi Abdominal: Soft, NT, ND, bowel sounds + Extremities: no edema, no cyanosis, RLE with improving ecchymosis, ankle without tenderness on palpation    The results of significant diagnostics from this hospitalization (including imaging, microbiology, ancillary and laboratory) are listed below for reference.     Microbiology: Recent Results (from the past 240 hour(s))  Culture, blood (x 2)     Status: None   Collection Time: 02/08/20  5:45 AM   Specimen: BLOOD  Result Value Ref Range Status   Specimen Description BLOOD LINE  Final   Special Requests   Final    BOTTLES DRAWN AEROBIC AND ANAEROBIC Blood Culture results may not be optimal due to an excessive volume of blood received in culture bottles   Culture   Final    NO GROWTH 5 DAYS Performed at [facility], [address], [place], [place]    Report Status 02/13/2020 FINAL  Final  Culture, blood (x 2)     Status: None   Collection Time: 02/08/20  7:16 AM   Specimen: BLOOD  Result Value Ref Range Status   Specimen Description BLOOD RIGHT ANTECUBITAL  Final   Special Requests   Final    BOTTLES DRAWN AEROBIC AND ANAEROBIC Blood Culture adequate volume   Culture   Final    NO GROWTH 5 DAYS Performed at [facility], [address] [place], [place]    Report Status 02/13/2020 FINAL  Final     Labs: BNP (last 3 results) No results for input(s): BNP in the last 8760 hours. Basic Metabolic Panel: Recent Labs  Lab 02/08/20 0545 02/08/20 0545 02/09/20 0645 02/09/20 0645 02/10/20 02/12/20 02/11/20 0615 02/12/20 0552 02/13/20 0545 02/14/20 0545  NA 143   < >  141   < > 141 140 139 136 139  K 3.4*   < > 3.2*   < > 3.0* 3.5 3.6 3.8 3.7  CL 108   < > 103   < > 99 98 98 97* 100  CO2 25   < > 28   < > 32 34* 32 29 28  GLUCOSE 101*   < > 95   < > 89 92 94 89 91  BUN 23*   < > 20   < > 19 19 18 17 17   CREATININE 0.60*   < > 0.65   < > 0.52* 0.72 0.53* 0.52* 0.52*  CALCIUM 9.0   < > 9.0   < > 8.9 9.2 9.0 9.0 8.6*  MG 1.6*   < > 1.5*   < > 1.4* 1.7 1.5* 1.7 1.5*  PHOS 4.3  --  4.0  --  3.6  --   --   --   --    < > = values in this interval not displayed.   Liver Function Tests: Recent Labs  Lab 02/10/20 0437 02/11/20 0615 02/12/20 0552 02/13/20 0545 02/14/20 0545  AST 97* 89* 81* 77* 77*  ALT 63* 57* 52* 49* 48*  ALKPHOS 55 57 50 50 49  BILITOT 8.4* 8.5* 8.4* 7.9* 7.8*  PROT 6.4* 6.5 6.8 6.7 6.5  ALBUMIN 2.7* 2.8* 2.8* 2.8* 2.8*   No results for input(s): LIPASE, AMYLASE in the last 168 hours. No results for input(s): AMMONIA in the last 168 hours. CBC: Recent Labs  Lab 02/08/20 0545 02/09/20 0645 02/10/20 0437 02/11/20 0615 02/12/20 0552 02/13/20 0545 02/14/20 0545  WBC 13.2*   < > 12.9* 11.8* 12.4* 12.5* 10.9*  NEUTROABS 9.0*  --   --   --   --   --   --   HGB 10.6*   < > 10.4* 11.1* 11.1* 10.8* 10.9*  HCT 32.7*   < > 32.1* 33.3* 33.5* 32.9* 32.9*  MCV 104.8*   < > 106.6* 103.4* 105.7* 106.1* 106.8*  PLT 167   < > 138* 134* 143* 128* 131*   < > = values in this interval not displayed.   Cardiac Enzymes: No results for input(s): CKTOTAL, CKMB, CKMBINDEX, TROPONINI in the last 168 hours. BNP: Invalid input(s): POCBNP CBG: Recent Labs  Lab 02/10/20 0304 02/10/20 0734 02/10/20 1120  02/10/20 1639 02/10/20 2011  GLUCAP 94 98 121* 89 98   D-Dimer No results for input(s): DDIMER in the last 72 hours. Hgb A1c No results for input(s): HGBA1C in the last 72 hours. Lipid Profile No results for input(s): CHOL, HDL, LDLCALC, TRIG, CHOLHDL, LDLDIRECT in the last 72 hours. Thyroid function studies No results for input(s): TSH, T4TOTAL, T3FREE, THYROIDAB in the last 72 hours.  Invalid input(s): FREET3 Anemia work up No results for input(s): VITAMINB12, FOLATE, FERRITIN, TIBC, IRON, RETICCTPCT in the last 72 hours. Urinalysis    Component Value Date/Time   COLORURINE AMBER (A) 02/08/2020 0455   APPEARANCEUR HAZY (A) 02/08/2020 0455   LABSPEC 1.026 02/08/2020 0455   PHURINE 5.0 02/08/2020 0455   GLUCOSEU NEGATIVE 02/08/2020 0455   HGBUR SMALL (A) 02/08/2020 0455   BILIRUBINUR NEGATIVE 02/08/2020 0455   KETONESUR NEGATIVE 02/08/2020 0455   PROTEINUR 30 (A) 02/08/2020 0455   NITRITE NEGATIVE 02/08/2020 0455   LEUKOCYTESUR NEGATIVE 02/08/2020 0455   Sepsis Labs Invalid input(s): PROCALCITONIN,  WBC,  LACTICIDVEN Microbiology Recent Results (from the past 240 hour(s))  Culture, blood (x 2)  Status: None   Collection Time: 02/08/20  5:45 AM   Specimen: BLOOD  Result Value Ref Range Status   Specimen Description BLOOD LINE  Final   Special Requests   Final    BOTTLES DRAWN AEROBIC AND ANAEROBIC Blood Culture results may not be optimal due to an excessive volume of blood received in culture bottles   Culture   Final    NO GROWTH 5 DAYS Performed at [facility], [address], [place], [place] [address]    Report Status 02/13/2020 FINAL  Final  Culture, blood (x 2)     Status: None   Collection Time: 02/08/20  7:16 AM   Specimen: BLOOD  Result Value Ref Range Status   Specimen Description BLOOD RIGHT ANTECUBITAL  Final   Special Requests   Final    BOTTLES DRAWN AEROBIC AND ANAEROBIC Blood Culture adequate volume   Culture   Final    NO GROWTH 5  DAYS Performed at [facility], [address] [place], [place] [address]    Report Status 02/13/2020 FINAL  Final     Time coordinating discharge: Over 30 minutes  SIGNED:   Pennie Banter, DO [facility] 02/14/2020, 11:24 AM   If 7PM-7AM, please contact night-coverage [email or web address]

## 2020-02-14 NOTE — Progress Notes (Signed)
   02/14/20 0900  Clinical Encounter Type  Visited With Patient and family together  Visit Type Follow-up  Referral From Chaplain  Consult/Referral To Chaplain  Visited with Pt and family regarding an AD. Pt and family member decided they did not need the AD. Ch will follow-up as needed.

## 2020-02-14 NOTE — Progress Notes (Signed)
CH encountered pt.'s brother [name] in 2nd floor hallway; br. shared pt. is scheduled to discharge soon to live w/him in [place]; Br. concerned that pt.'s lifestyle choices and values may conflict with those he and his family hold; he is also concerned about pt.'s decision-making capacity.  Br. grateful to talk w/CH and [facility] today and appreciates the support.  Chaplains remain available as needed.

## 2020-02-14 NOTE — TOC Transition Note (Signed)
Transition of Care [facility]) - CM/SW Discharge Note   Patient Details  Name: [name] MRN: [id number] Date of Birth: October 26, 1970  Transition of Care [facility]) CM/SW Contact:  Shawn Route, RN Phone Number: 02/14/2020, 12:57 PM   Clinical Narrative:     Patient to discharge home with brother [name].  Verified wheelchair was delivered to patient prior to discharge.  Brother plans to arrange his own transport to [place], although instead of flying his brother, he reported to nurse that he was going to have an ambulance take him to [place].  The patient denied this, saying he was taking his RV to [place].  Final next level of care: Home/Self Care Barriers to Discharge: Barriers Resolved   Patient Goals and CMS Choice Patient states their goals for this hospitalization and ongoing recovery are:: to go to [place] to see a specialist   Choice offered to / list presented to : Patient  Discharge Placement                       Discharge Plan and Services                DME Arranged: Wheelchair manual DME Agency: [employer] Date DME Agency Contacted: 02/13/20 Time DME Agency Contacted: [phone] Representative spoke with at DME Agency: Elease Hashimoto            Social Determinants of Health (SDOH) Interventions     Readmission Risk Interventions No flowsheet data found.

## 2020-03-22 ENCOUNTER — Encounter (INDEPENDENT_AMBULATORY_CARE_PROVIDER_SITE_OTHER): Payer: Self-pay

## 2020-06-24 DIAGNOSIS — B182 Chronic viral hepatitis C: Secondary | ICD-10-CM | POA: Diagnosis present

## 2020-07-05 LAB — COLOGUARD

## 2020-07-14 LAB — COLOGUARD

## 2020-07-25 LAB — COLOGUARD: COLOGUARD: NEGATIVE

## 2021-03-13 ENCOUNTER — Other Ambulatory Visit: Payer: Self-pay

## 2021-03-13 ENCOUNTER — Emergency Department: Payer: 59

## 2021-03-13 ENCOUNTER — Emergency Department
Admission: EM | Admit: 2021-03-13 | Discharge: 2021-03-13 | Disposition: A | Discharge status: Home / Self Care | Payer: 59 | Attending: Emergency Medicine | Admitting: Emergency Medicine

## 2021-03-13 DIAGNOSIS — R079 Chest pain, unspecified: Secondary | ICD-10-CM | POA: Diagnosis present

## 2021-03-13 DIAGNOSIS — R002 Palpitations: Secondary | ICD-10-CM | POA: Insufficient documentation

## 2021-03-13 LAB — URINE DRUG SCREEN, QUALITATIVE (ARMC ONLY)
Amphetamines, Ur Screen: NOT DETECTED
Barbiturates, Ur Screen: NOT DETECTED
Benzodiazepine, Ur Scrn: NOT DETECTED
Cannabinoid 50 Ng, Ur ~~LOC~~: POSITIVE — AB
Cocaine Metabolite,Ur ~~LOC~~: NOT DETECTED
MDMA (Ecstasy)Ur Screen: NOT DETECTED
Methadone Scn, Ur: NOT DETECTED
Opiate, Ur Screen: NOT DETECTED
Phencyclidine (PCP) Ur S: NOT DETECTED
Tricyclic, Ur Screen: NOT DETECTED

## 2021-03-13 LAB — COMPREHENSIVE METABOLIC PANEL
ALT: 74 U/L — ABNORMAL HIGH (ref 0–44)
AST: 110 U/L — ABNORMAL HIGH (ref 15–41)
Albumin: 4.2 g/dL (ref 3.5–5.0)
Alkaline Phosphatase: 119 U/L (ref 38–126)
Anion gap: 11 (ref 5–15)
BUN: 10 mg/dL (ref 6–20)
CO2: 27 mmol/L (ref 22–32)
Calcium: 9.3 mg/dL (ref 8.9–10.3)
Chloride: 104 mmol/L (ref 98–111)
Creatinine, Ser: 0.58 mg/dL — ABNORMAL LOW (ref 0.61–1.24)
GFR, Estimated: >60 mL/min (ref >60)
Glucose, Bld: 145 mg/dL — ABNORMAL HIGH (ref 70–99)
Potassium: 3.1 mmol/L — ABNORMAL LOW (ref 3.5–5.1)
Sodium: 142 mmol/L (ref 135–145)
Total Bilirubin: 1.4 mg/dL — ABNORMAL HIGH (ref 0.3–1.2)
Total Protein: 7.9 g/dL (ref 6.5–8.1)

## 2021-03-13 LAB — CBC
HCT: 40.9 % (ref 39.0–52.0)
Hemoglobin: 14.5 g/dL (ref 13.0–17.0)
MCH: 35 pg — ABNORMAL HIGH (ref 26.0–34.0)
MCHC: 35.5 g/dL (ref 30.0–36.0)
MCV: 98.8 fL (ref 80.0–100.0)
Platelets: 194 10*3/uL (ref 150–400)
RBC: 4.14 MIL/uL — ABNORMAL LOW (ref 4.22–5.81)
RDW: 13.5 % (ref 11.5–15.5)
WBC: 10.2 10*3/uL (ref 4.0–10.5)
nRBC: 0 % (ref 0.0–0.2)

## 2021-03-13 LAB — TROPONIN I (HIGH SENSITIVITY)
Troponin I (High Sensitivity): 17 ng/L (ref <18)
Troponin I (High Sensitivity): 36 ng/L — ABNORMAL HIGH (ref <18)
Troponin I (High Sensitivity): 34 ng/L — ABNORMAL HIGH (ref <18)

## 2021-03-13 MED ORDER — SODIUM CHLORIDE 0.9 % IV BOLUS
500.0000 mL | Freq: Once | INTRAVENOUS | Status: AC
  Administered 2021-03-13: 500 mL via INTRAVENOUS

## 2021-03-13 MED ORDER — POTASSIUM CHLORIDE CRYS ER 20 MEQ PO TBCR
40.0000 meq | EXTENDED_RELEASE_TABLET | Freq: Once | ORAL | Status: AC
  Administered 2021-03-13: 40 meq via ORAL
  Filled 2021-03-13: qty 2

## 2021-03-13 NOTE — ED Triage Notes (Addendum)
Pt in with co chest pain since 0000 states palpitations with radiation to left arm. [facility], given 324 mg ASA and nitro x 1. Hx of afib, no on meds for the same. Pt took CBD gummy today.

## 2021-03-13 NOTE — ED Provider Notes (Signed)
[facility] Emergency Department Provider Note   ____________________________________________    I have reviewed the triage vital signs and the nursing notes.   HISTORY  Chief Complaint Chest Pain     HPI [name] is a 50 y.o. male with extensive past medical history as detailed below who presents with complaints of palpitations.  Patient reports over the last month he has had intermittent palpitations.  Last night he had them after falling asleep.  Currently he reports they have improved.  He reports that he has recently moved back to the area and needs referrals to GI and neurology and cardiology.  Denies chest pain at this time.  No shortness of breath.  No calf pain or swelling  Past Medical History:  Diagnosis Date   Alcohol abuse    Anemia    a. 01/2020 s/p fall w/ significant R hip hematoma-->4 u prbcs during admission.   Delirium tremens (HCC) 01/2020   Hepatic failure (HCC)    a. 01/2020 - ETOH/sepsis/rhabdo   Polysubstance abuse (HCC)    Respiratory failure (HCC)    a. 01/2020 in setting of DTs/Sepsis req intubation.   Rhabdomyolysis 01/2020   Sepsis (HCC) 01/2020    Patient Active Problem List   Diagnosis Date Noted   PAF (paroxysmal atrial fibrillation) (HCC)    Pure hypercholesterolemia    Alcohol abuse    Goals of care, counseling/discussion    Palliative care by specialist    Rhabdomyolysis 01/25/2020   Sepsis (HCC) 01/25/2020   Hypovolemia 01/25/2020   Hypotension 01/25/2020   GIB (gastrointestinal bleeding) 01/24/2020   Transaminitis 01/24/2020   Hyponatremia 01/24/2020   Hypokalemia 01/24/2020   Falls 01/24/2020   Hip pain 01/24/2020   Acute blood loss anemia 01/23/2020   Substance induced mood disorder (HCC) 05/04/2017    No past surgical history on file.  Prior to Admission medications   Medication Sig Start Date End Date Taking? Authorizing Provider  atorvastatin (LIPITOR) 20 MG tablet Take 20 mg by  mouth at bedtime. 11/08/19   [provider]  folic acid (FOLVITE) 1 MG tablet Take 1 tablet (1 mg total) by mouth daily. 02/15/20   Pennie Banter, DO  lactulose (CHRONULAC) 10 GM/15ML solution Take 45 mLs (30 g total) by mouth 2 (two) times daily. 02/14/20   Esaw Grandchild A, DO  metoprolol tartrate 37.5 MG TABS Take 37.5 mg by mouth 2 (two) times daily. 02/14/20   Pennie Banter, DO  Multiple Vitamin (MULTIVITAMIN WITH MINERALS) TABS tablet Take 1 tablet by mouth daily. 02/15/20   Pennie Banter, DO  pantoprazole (PROTONIX) 40 MG tablet Take 1 tablet (40 mg total) by mouth 2 (two) times daily. 02/14/20   Pennie Banter, DO  rifaximin (XIFAXAN) 550 MG TABS tablet Take 1 tablet (550 mg total) by mouth 2 (two) times daily. 02/14/20   Esaw Grandchild A, DO  thiamine 100 MG tablet Take 1 tablet (100 mg total) by mouth daily. 02/15/20   Pennie Banter, DO     Allergies Patient has no known allergies.  No family history on file.  Social History Social History   Tobacco Use   Smoking status: Never   Smokeless tobacco: Never  Vaping Use   Vaping Use: Never used  Substance Use Topics   Alcohol use: Yes    Comment: 5-6 every other day   Drug use: Not Currently    Review of Systems  Constitutional: No fever/chills Eyes: No visual  changes.  ENT: No sore throat. Cardiovascular: As above Respiratory: Denies shortness of breath. Gastrointestinal: No abdominal pain.  No nausea, no vomiting.   Genitourinary: Negative for dysuria. Musculoskeletal: Negative for back pain. Skin: Negative for rash. Neurological: Negative for headaches or weakness   ____________________________________________   PHYSICAL EXAM:  VITAL SIGNS: ED Triage Vitals  Enc Vitals Group     BP 03/13/21 0204 124/85     Pulse Rate 03/13/21 0204 (!) 127     Resp 03/13/21 0204 20     Temp 03/13/21 0204 98.3 F (36.8 C)     Temp Source 03/13/21 0204 Oral     SpO2 03/13/21 0204 92 %     Weight  03/13/21 0205 86.2 kg (190 lb)     Height 03/13/21 0205 1.727 m (5\' 8" )     Head Circumference --      Peak Flow --      Pain Score 03/13/21 0205 7     Pain Loc --      Pain Edu? --      Excl. in GC? --     Constitutional: Alert and oriented. No acute distress. Pleasant and interactive Eyes: Conjunctivae are normal.   Nose: No congestion/rhinnorhea. Mouth/Throat: Mucous membranes are moist.    Cardiovascular: Normal rate, regular rhythm. Grossly normal heart sounds.  Good peripheral circulation. Respiratory: Normal respiratory effort.  No retractions. Lungs CTAB. Gastrointestinal: Soft and nontender. No distention.  No CVA tenderness. Genitourinary: deferred Musculoskeletal: No lower extremity tenderness nor edema.  Warm and well perfused Neurologic:  Normal speech and language. No gross focal neurologic deficits are appreciated.  Skin:  Skin is warm, dry and intact. No rash noted. Psychiatric: Mood and affect are normal. Speech and behavior are normal.  ____________________________________________   LABS (all labs ordered are listed, but only abnormal results are displayed)  Labs Reviewed  CBC - Abnormal; Notable for the following components:      Result Value   RBC 4.14 (*)    MCH 35.0 (*)    All other components within normal limits  COMPREHENSIVE METABOLIC PANEL - Abnormal; Notable for the following components:   Potassium 3.1 (*)    Glucose, Bld 145 (*)    Creatinine, Ser 0.58 (*)    AST 110 (*)    ALT 74 (*)    Total Bilirubin 1.4 (*)    All other components within normal limits  URINE DRUG SCREEN, QUALITATIVE ([facility] ONLY) - Abnormal; Notable for the following components:   Cannabinoid 50 Ng, Ur [place] POSITIVE (*)    All other components within normal limits  TROPONIN I (HIGH SENSITIVITY) - Abnormal; Notable for the following components:   Troponin I (High Sensitivity) 36 (*)    All other components within normal limits  TROPONIN I (HIGH SENSITIVITY) - Abnormal;  Notable for the following components:   Troponin I (High Sensitivity) 34 (*)    All other components within normal limits  TROPONIN I (HIGH SENSITIVITY)   ____________________________________________  EKG  ED ECG REPORT I, 03/15/21, the attending physician, personally viewed and interpreted this ECG.  Date: 03/13/2021  Rhythm: Sinus tachycardia QRS Axis: normal Intervals: normal ST/T Wave abnormalities: normal Narrative Interpretation: no evidence of acute ischemia  ____________________________________________  RADIOLOGY  Chest x-ray reviewed by me, no acute abnormality ____________________________________________   PROCEDURES  Procedure(s) performed: No  Procedures   Critical Care performed: No ____________________________________________   INITIAL IMPRESSION / ASSESSMENT AND PLAN / ED COURSE  Pertinent labs & imaging  results that were available during my care of the patient were reviewed by me and considered in my medical decision making (see chart for details).   Patient presents with complaints of palpitations as detailed above.  He is overall well-appearing and in no acute distress, vital signs are normal here.  Heart rate is normal.  Exam is reassuring.  EKG initially demonstrated sinus tachycardia, however his heart rate has since improved.  Initial high sensitive troponin of 17, repeat of 36, I suspect this is related to his brief episode of palpitations.  We will check an additional troponin to see if it is trending back down.  Potassium given for mild hypokalemia, IV fluids   ----------------------------------------- 10:50 AM on 03/13/2021 ----------------------------------------- Patient resting comfortably, troponin trending back down, vitals normal, no pain. Appropriate for d/c with outpatient follow up    ____________________________________________   FINAL CLINICAL IMPRESSION(S) / ED DIAGNOSES  Final diagnoses:  Palpitations         Note:  This document was prepared using Dragon voice recognition software and may include unintentional dictation errors.    Jene Every, MD 03/13/21 1050

## 2021-06-06 ENCOUNTER — Other Ambulatory Visit: Payer: Self-pay

## 2021-06-06 ENCOUNTER — Emergency Department: Payer: 59

## 2021-06-06 DIAGNOSIS — F1010 Alcohol abuse, uncomplicated: Secondary | ICD-10-CM | POA: Insufficient documentation

## 2021-06-06 DIAGNOSIS — I517 Cardiomegaly: Secondary | ICD-10-CM | POA: Diagnosis not present

## 2021-06-06 DIAGNOSIS — S8011XA Contusion of right lower leg, initial encounter: Secondary | ICD-10-CM | POA: Diagnosis not present

## 2021-06-06 DIAGNOSIS — S8991XA Unspecified injury of right lower leg, initial encounter: Secondary | ICD-10-CM | POA: Diagnosis present

## 2021-06-06 DIAGNOSIS — X58XXXA Exposure to other specified factors, initial encounter: Secondary | ICD-10-CM | POA: Insufficient documentation

## 2021-06-06 DIAGNOSIS — R5381 Other malaise: Secondary | ICD-10-CM | POA: Diagnosis not present

## 2021-06-06 DIAGNOSIS — R1012 Left upper quadrant pain: Secondary | ICD-10-CM | POA: Insufficient documentation

## 2021-06-06 DIAGNOSIS — R079 Chest pain, unspecified: Secondary | ICD-10-CM | POA: Diagnosis not present

## 2021-06-06 DIAGNOSIS — R609 Edema, unspecified: Secondary | ICD-10-CM | POA: Diagnosis not present

## 2021-06-06 DIAGNOSIS — R69 Illness, unspecified: Secondary | ICD-10-CM | POA: Diagnosis not present

## 2021-06-06 DIAGNOSIS — R109 Unspecified abdominal pain: Secondary | ICD-10-CM | POA: Diagnosis not present

## 2021-06-06 DIAGNOSIS — R233 Spontaneous ecchymoses: Secondary | ICD-10-CM | POA: Diagnosis not present

## 2021-06-06 DIAGNOSIS — I10 Essential (primary) hypertension: Secondary | ICD-10-CM | POA: Diagnosis not present

## 2021-06-06 DIAGNOSIS — M7989 Other specified soft tissue disorders: Secondary | ICD-10-CM | POA: Diagnosis not present

## 2021-06-06 LAB — CBC WITH DIFFERENTIAL/PLATELET
Abs Immature Granulocytes: 0.03 10*3/uL (ref 0.00–0.07)
Basophils Absolute: 0.1 10*3/uL (ref 0.0–0.1)
Basophils Relative: 1 %
Eosinophils Absolute: 0.4 10*3/uL (ref 0.0–0.5)
Eosinophils Relative: 4 %
HCT: 35.2 % — ABNORMAL LOW (ref 39.0–52.0)
Hemoglobin: 12.1 g/dL — ABNORMAL LOW (ref 13.0–17.0)
Immature Granulocytes: 0 %
Lymphocytes Relative: 20 %
Lymphs Abs: 1.8 10*3/uL (ref 0.7–4.0)
MCH: 34.2 pg — ABNORMAL HIGH (ref 26.0–34.0)
MCHC: 34.4 g/dL (ref 30.0–36.0)
MCV: 99.4 fL (ref 80.0–100.0)
Monocytes Absolute: 1.3 10*3/uL — ABNORMAL HIGH (ref 0.1–1.0)
Monocytes Relative: 16 %
Neutro Abs: 5.1 10*3/uL (ref 1.7–7.7)
Neutrophils Relative %: 59 %
Platelets: 229 10*3/uL (ref 150–400)
RBC: 3.54 MIL/uL — ABNORMAL LOW (ref 4.22–5.81)
RDW: 14.6 % (ref 11.5–15.5)
WBC: 8.7 10*3/uL (ref 4.0–10.5)
nRBC: 0 % (ref 0.0–0.2)

## 2021-06-06 LAB — COMPREHENSIVE METABOLIC PANEL
ALT: 38 U/L (ref 0–44)
AST: 77 U/L — ABNORMAL HIGH (ref 15–41)
Albumin: 4.4 g/dL (ref 3.5–5.0)
Alkaline Phosphatase: 89 U/L (ref 38–126)
Anion gap: 8 (ref 5–15)
BUN: 14 mg/dL (ref 6–20)
CO2: 26 mmol/L (ref 22–32)
Calcium: 9.4 mg/dL (ref 8.9–10.3)
Chloride: 103 mmol/L (ref 98–111)
Creatinine, Ser: 0.64 mg/dL (ref 0.61–1.24)
GFR, Estimated: >60 mL/min (ref >60)
Glucose, Bld: 98 mg/dL (ref 70–99)
Potassium: 3.9 mmol/L (ref 3.5–5.1)
Sodium: 137 mmol/L (ref 135–145)
Total Bilirubin: 2.8 mg/dL — ABNORMAL HIGH (ref 0.3–1.2)
Total Protein: 8.3 g/dL — ABNORMAL HIGH (ref 6.5–8.1)
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
## 2021-06-07 ENCOUNTER — Emergency Department
Admission: EM | Admit: 2021-06-07 | Discharge: 2021-06-07 | Disposition: A | Discharge status: Home / Self Care | Payer: 59 | Attending: Emergency Medicine | Admitting: Emergency Medicine

## 2021-06-07 ENCOUNTER — Emergency Department: Payer: 59

## 2021-06-07 ENCOUNTER — Encounter: Payer: Self-pay | Admitting: Radiology

## 2021-06-07 DIAGNOSIS — M7989 Other specified soft tissue disorders: Secondary | ICD-10-CM | POA: Diagnosis not present

## 2021-06-07 DIAGNOSIS — R233 Spontaneous ecchymoses: Secondary | ICD-10-CM

## 2021-06-07 DIAGNOSIS — R079 Chest pain, unspecified: Secondary | ICD-10-CM | POA: Diagnosis not present

## 2021-06-07 DIAGNOSIS — T148XXA Other injury of unspecified body region, initial encounter: Secondary | ICD-10-CM

## 2021-06-07 DIAGNOSIS — I517 Cardiomegaly: Secondary | ICD-10-CM | POA: Diagnosis not present

## 2021-06-07 DIAGNOSIS — R109 Unspecified abdominal pain: Secondary | ICD-10-CM | POA: Diagnosis not present

## 2021-06-07 DIAGNOSIS — M79604 Pain in right leg: Secondary | ICD-10-CM

## 2021-06-07 DIAGNOSIS — F1010 Alcohol abuse, uncomplicated: Secondary | ICD-10-CM

## 2021-06-07 LAB — PROTIME-INR
INR: 1.1 (ref 0.8–1.2)
Prothrombin Time: 14.5 seconds (ref 11.4–15.2)

## 2021-06-07 LAB — ETHANOL: Alcohol, Ethyl (B): <10 mg/dL (ref <10)

## 2021-06-07 LAB — LACTIC ACID, PLASMA: Lactic Acid, Venous: 1 mmol/L (ref 0.5–1.9)

## 2021-06-07 LAB — TROPONIN I (HIGH SENSITIVITY): Troponin I (High Sensitivity): 14 ng/L (ref <18)

## 2021-06-07 LAB — PROCALCITONIN: Procalcitonin: 0.11 ng/mL

## 2021-06-07 MED ORDER — KETOROLAC TROMETHAMINE 30 MG/ML IJ SOLN
30.0000 mg | Freq: Once | INTRAMUSCULAR | Status: AC
  Administered 2021-06-07: 30 mg via INTRAVENOUS
  Filled 2021-06-07: qty 1

## 2021-06-07 MED ORDER — CHLORDIAZEPOXIDE HCL 25 MG PO CAPS: 25.0000 mg | ORAL_CAPSULE | ORAL | 0 refills | Status: DC

## 2021-06-07 MED ORDER — IOHEXOL 350 MG/ML SOLN
100.0000 mL | Freq: Once | INTRAVENOUS | Status: AC | PRN
  Administered 2021-06-07: 100 mL via INTRAVENOUS

## 2021-06-07 MED ORDER — IBUPROFEN 800 MG PO TABS: 800.0000 mg | ORAL_TABLET | Freq: Three times a day (TID) | ORAL | 0 refills | Status: DC | PRN

## 2021-06-07 NOTE — ED Provider Notes (Signed)
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 were signs of ongoing denervation in the right tibialis anterior and gastrocnemius muscle and less so in the right semitendinosis muscle.  3. Notably, the right gluteus medius and lumbosacral paraspinal muscles were normal.  Conclusion: This is an abnormal study. 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 There is no electrodiagnostic evidence of right lumbosacral radiculopathy or  polyneuropathy.        FINAL CLINICAL IMPRESSION(S) / ED DIAGNOSES   Final diagnoses:  Right leg pain  Spontaneous ecchymosis  Hematoma  Alcohol abuse     Rx / DC Orders   ED Discharge Orders          Ordered    chlordiazePOXIDE (LIBRIUM) 25 MG capsule  As directed        06/07/21 0527    ibuprofen (ADVIL) 800 MG tablet  Every 8 hours PRN        06/07/21 0527             Note:  This document was prepared using Dragon voice recognition software and may include unintentional dictation errors.   Lashun Mccants, Delice Bison, DO 06/07/21 [phone]

## 2021-06-07 NOTE — ED Notes (Signed)
Pt refused vital signs at discharge.  

## 2021-06-07 NOTE — Discharge Instructions (Addendum)
You may take Ibuprofen 800 mg every 8 hours as needed for pain, fever.  Please take Ibuprofen with food.      I recommend you avoid Tylenol, alcohol given your history of cirrhosis.   Please follow-up closely with your primary care doctor for further evaluation of why you developed a spontaneous hematoma.  Your platelets and INR today were normal.  Your CT scan showed no sign of active bleeding.

## 2021-06-12 LAB — CULTURE, BLOOD (ROUTINE X 2)
Culture: NO GROWTH
Culture: NO GROWTH

## 2021-06-25 ENCOUNTER — Encounter: Payer: Self-pay | Admitting: Emergency Medicine

## 2021-06-25 ENCOUNTER — Emergency Department: Payer: 59

## 2021-06-25 ENCOUNTER — Inpatient Hospital Stay: Payer: 59

## 2021-06-25 ENCOUNTER — Other Ambulatory Visit: Payer: Self-pay

## 2021-06-25 ENCOUNTER — Observation Stay
Admission: EM | Admit: 2021-06-25 | Discharge: 2021-06-26 | Disposition: A | Discharge status: Home / Self Care | Payer: 59 | Attending: Internal Medicine | Admitting: Internal Medicine

## 2021-06-25 DIAGNOSIS — L97512 Non-pressure chronic ulcer of other part of right foot with fat layer exposed: Principal | ICD-10-CM | POA: Insufficient documentation

## 2021-06-25 DIAGNOSIS — R69 Illness, unspecified: Secondary | ICD-10-CM | POA: Diagnosis not present

## 2021-06-25 DIAGNOSIS — F1020 Alcohol dependence, uncomplicated: Secondary | ICD-10-CM

## 2021-06-25 DIAGNOSIS — Z20822 Contact with and (suspected) exposure to covid-19: Secondary | ICD-10-CM | POA: Insufficient documentation

## 2021-06-25 DIAGNOSIS — M21371 Foot drop, right foot: Secondary | ICD-10-CM

## 2021-06-25 DIAGNOSIS — K7460 Unspecified cirrhosis of liver: Secondary | ICD-10-CM | POA: Diagnosis not present

## 2021-06-25 DIAGNOSIS — I480 Paroxysmal atrial fibrillation: Secondary | ICD-10-CM | POA: Diagnosis not present

## 2021-06-25 DIAGNOSIS — M87079 Idiopathic aseptic necrosis of unspecified toe(s): Secondary | ICD-10-CM | POA: Diagnosis not present

## 2021-06-25 DIAGNOSIS — R011 Cardiac murmur, unspecified: Secondary | ICD-10-CM

## 2021-06-25 DIAGNOSIS — M7989 Other specified soft tissue disorders: Secondary | ICD-10-CM | POA: Diagnosis not present

## 2021-06-25 DIAGNOSIS — F1090 Alcohol use, unspecified, uncomplicated: Secondary | ICD-10-CM | POA: Diagnosis not present

## 2021-06-25 DIAGNOSIS — B182 Chronic viral hepatitis C: Secondary | ICD-10-CM | POA: Diagnosis present

## 2021-06-25 DIAGNOSIS — Z9181 History of falling: Secondary | ICD-10-CM

## 2021-06-25 DIAGNOSIS — K7030 Alcoholic cirrhosis of liver without ascites: Secondary | ICD-10-CM | POA: Insufficient documentation

## 2021-06-25 DIAGNOSIS — L97502 Non-pressure chronic ulcer of other part of unspecified foot with fat layer exposed: Secondary | ICD-10-CM | POA: Diagnosis present

## 2021-06-25 DIAGNOSIS — M79661 Pain in right lower leg: Secondary | ICD-10-CM | POA: Diagnosis not present

## 2021-06-25 DIAGNOSIS — L03115 Cellulitis of right lower limb: Secondary | ICD-10-CM | POA: Diagnosis present

## 2021-06-25 DIAGNOSIS — I96 Gangrene, not elsewhere classified: Secondary | ICD-10-CM

## 2021-06-25 DIAGNOSIS — R600 Localized edema: Secondary | ICD-10-CM | POA: Diagnosis not present

## 2021-06-25 LAB — COMPREHENSIVE METABOLIC PANEL
ALT: 40 U/L (ref 0–44)
AST: 78 U/L — ABNORMAL HIGH (ref 15–41)
Albumin: 4.1 g/dL (ref 3.5–5.0)
Alkaline Phosphatase: 99 U/L (ref 38–126)
Anion gap: 7 (ref 5–15)
BUN: 7 mg/dL (ref 6–20)
CO2: 27 mmol/L (ref 22–32)
Calcium: 9 mg/dL (ref 8.9–10.3)
Chloride: 113 mmol/L — ABNORMAL HIGH (ref 98–111)
Creatinine, Ser: 0.74 mg/dL (ref 0.61–1.24)
GFR, Estimated: >60 mL/min (ref >60)
Glucose, Bld: 127 mg/dL — ABNORMAL HIGH (ref 70–99)
Potassium: 3.7 mmol/L (ref 3.5–5.1)
Sodium: 147 mmol/L — ABNORMAL HIGH (ref 135–145)
Total Bilirubin: 1.2 mg/dL (ref 0.3–1.2)
Total Protein: 7.9 g/dL (ref 6.5–8.1)

## 2021-06-25 LAB — CBC WITH DIFFERENTIAL/PLATELET
Abs Immature Granulocytes: 0.02 10*3/uL (ref 0.00–0.07)
Basophils Absolute: 0.1 10*3/uL (ref 0.0–0.1)
Basophils Relative: 1 %
Eosinophils Absolute: 0.2 10*3/uL (ref 0.0–0.5)
Eosinophils Relative: 2 %
HCT: 39 % (ref 39.0–52.0)
Hemoglobin: 12.9 g/dL — ABNORMAL LOW (ref 13.0–17.0)
Immature Granulocytes: 0 %
Lymphocytes Relative: 24 %
Lymphs Abs: 2.2 10*3/uL (ref 0.7–4.0)
MCH: 33.5 pg (ref 26.0–34.0)
MCHC: 33.1 g/dL (ref 30.0–36.0)
MCV: 101.3 fL — ABNORMAL HIGH (ref 80.0–100.0)
Monocytes Absolute: 1 10*3/uL (ref 0.1–1.0)
Monocytes Relative: 11 %
Neutro Abs: 5.5 10*3/uL (ref 1.7–7.7)
Neutrophils Relative %: 62 %
Platelets: 193 10*3/uL (ref 150–400)
RBC: 3.85 MIL/uL — ABNORMAL LOW (ref 4.22–5.81)
RDW: 14.2 % (ref 11.5–15.5)
WBC: 8.9 10*3/uL (ref 4.0–10.5)
nRBC: 0 % (ref 0.0–0.2)

## 2021-06-25 LAB — SEDIMENTATION RATE: Sed Rate: 24 mm/hr — ABNORMAL HIGH (ref 0–20)

## 2021-06-25 LAB — RESP PANEL BY RT-PCR (FLU A&B, COVID) ARPGX2
Influenza A by PCR: NEGATIVE
Influenza B by PCR: NEGATIVE
SARS Coronavirus 2 by RT PCR: NEGATIVE

## 2021-06-25 LAB — CK: Total CK: 669 U/L — ABNORMAL HIGH (ref 49–397)

## 2021-06-25 LAB — AMMONIA: Ammonia: 26 umol/L (ref 9–35)

## 2021-06-25 LAB — LACTIC ACID, PLASMA
Lactic Acid, Venous: 1.2 mmol/L (ref 0.5–1.9)
Lactic Acid, Venous: 1.1 mmol/L (ref 0.5–1.9)

## 2021-06-25 MED ORDER — LORAZEPAM 2 MG/ML IJ SOLN: 1.0000 mg | INTRAMUSCULAR | Status: DC | PRN

## 2021-06-25 MED ORDER — ONDANSETRON HCL 4 MG PO TABS: 4.0000 mg | ORAL_TABLET | Freq: Four times a day (QID) | ORAL | Status: DC | PRN

## 2021-06-25 MED ORDER — VANCOMYCIN HCL IN DEXTROSE 1-5 GM/200ML-% IV SOLN
1000.0000 mg | Freq: Two times a day (BID) | INTRAVENOUS | Status: DC
  Administered 2021-06-26: 1000 mg via INTRAVENOUS
  Filled 2021-06-25 (×3): qty 200

## 2021-06-25 MED ORDER — ONDANSETRON HCL 4 MG/2ML IJ SOLN
4.0000 mg | Freq: Four times a day (QID) | INTRAMUSCULAR | Status: DC | PRN
  Administered 2021-06-26: 4 mg via INTRAVENOUS
  Filled 2021-06-25: qty 2

## 2021-06-25 MED ORDER — SODIUM CHLORIDE 0.9 % IV BOLUS (SEPSIS)
1000.0000 mL | Freq: Once | INTRAVENOUS | Status: AC
  Administered 2021-06-25: 1000 mL via INTRAVENOUS

## 2021-06-25 MED ORDER — VANCOMYCIN HCL 1750 MG/350ML IV SOLN
1750.0000 mg | Freq: Once | INTRAVENOUS | Status: AC
  Administered 2021-06-26: 1750 mg via INTRAVENOUS
  Filled 2021-06-25: qty 350

## 2021-06-25 MED ORDER — IBUPROFEN 600 MG PO TABS
600.0000 mg | ORAL_TABLET | Freq: Once | ORAL | Status: AC
  Administered 2021-06-25: 600 mg via ORAL
  Filled 2021-06-25: qty 1

## 2021-06-25 MED ORDER — ADULT MULTIVITAMIN W/MINERALS CH
1.0000 | ORAL_TABLET | Freq: Every day | ORAL | Status: DC
  Administered 2021-06-25 – 2021-06-26 (×2): 1 via ORAL
  Filled 2021-06-25 (×2): qty 1

## 2021-06-25 MED ORDER — THIAMINE HCL 100 MG PO TABS: 100.0000 mg | ORAL_TABLET | Freq: Every day | ORAL | Status: DC

## 2021-06-25 MED ORDER — METOPROLOL SUCCINATE ER 25 MG PO TB24
25.0000 mg | ORAL_TABLET | Freq: Every day | ORAL | Status: DC
  Administered 2021-06-26: 25 mg via ORAL
  Filled 2021-06-25: qty 1

## 2021-06-25 MED ORDER — MORPHINE SULFATE (PF) 2 MG/ML IV SOLN
2.0000 mg | INTRAVENOUS | Status: DC | PRN
  Administered 2021-06-26 (×2): 2 mg via INTRAVENOUS
  Filled 2021-06-25 (×2): qty 1

## 2021-06-25 MED ORDER — SODIUM CHLORIDE 0.9 % IV SOLN
2.0000 g | Freq: Once | INTRAVENOUS | Status: AC
  Administered 2021-06-25: 2 g via INTRAVENOUS
  Filled 2021-06-25: qty 2

## 2021-06-25 MED ORDER — METRONIDAZOLE 500 MG/100ML IV SOLN
500.0000 mg | Freq: Once | INTRAVENOUS | Status: AC
  Administered 2021-06-25: 500 mg via INTRAVENOUS
  Filled 2021-06-25: qty 100

## 2021-06-25 MED ORDER — IBUPROFEN 400 MG PO TABS: 800.0000 mg | ORAL_TABLET | Freq: Three times a day (TID) | ORAL | Status: DC | PRN

## 2021-06-25 MED ORDER — ADULT MULTIVITAMIN W/MINERALS CH: 1.0000 | ORAL_TABLET | Freq: Every day | ORAL | Status: DC

## 2021-06-25 MED ORDER — NORTRIPTYLINE HCL 10 MG PO CAPS
20.0000 mg | ORAL_CAPSULE | Freq: Every day | ORAL | Status: DC
  Filled 2021-06-25 (×2): qty 2

## 2021-06-25 MED ORDER — LORAZEPAM 1 MG PO TABS: 1.0000 mg | ORAL_TABLET | ORAL | Status: DC | PRN

## 2021-06-25 MED ORDER — LORAZEPAM 2 MG/ML IJ SOLN
0.0000 mg | Freq: Four times a day (QID) | INTRAMUSCULAR | Status: DC
  Filled 2021-06-25: qty 1

## 2021-06-25 MED ORDER — SODIUM CHLORIDE 0.9 % IV SOLN
2.0000 g | Freq: Three times a day (TID) | INTRAVENOUS | Status: DC
  Administered 2021-06-26 (×2): 2 g via INTRAVENOUS
  Filled 2021-06-25 (×5): qty 2

## 2021-06-25 MED ORDER — VANCOMYCIN HCL IN DEXTROSE 1-5 GM/200ML-% IV SOLN: 1000.0000 mg | Freq: Once | INTRAVENOUS | Status: DC

## 2021-06-25 MED ORDER — ASPIRIN 81 MG PO CHEW
81.0000 mg | CHEWABLE_TABLET | Freq: Every day | ORAL | Status: DC
Start: 2021-06-25 — End: 2021-06-27
  Administered 2021-06-26: 81 mg via ORAL
  Filled 2021-06-25: qty 1

## 2021-06-25 MED ORDER — TIZANIDINE HCL 4 MG PO TABS
4.0000 mg | ORAL_TABLET | Freq: Three times a day (TID) | ORAL | Status: DC
  Administered 2021-06-26: 4 mg via ORAL
  Filled 2021-06-25 (×5): qty 1

## 2021-06-25 MED ORDER — THIAMINE HCL 100 MG/ML IJ SOLN
100.0000 mg | Freq: Every day | INTRAMUSCULAR | Status: DC
  Administered 2021-06-26: 100 mg via INTRAVENOUS
  Filled 2021-06-25: qty 2

## 2021-06-25 MED ORDER — THIAMINE HCL 100 MG PO TABS
100.0000 mg | ORAL_TABLET | Freq: Every day | ORAL | Status: DC
  Administered 2021-06-25: 100 mg via ORAL
  Filled 2021-06-25 (×2): qty 1

## 2021-06-25 MED ORDER — ACETAMINOPHEN 325 MG PO TABS: 650.0000 mg | ORAL_TABLET | Freq: Four times a day (QID) | ORAL | Status: DC | PRN

## 2021-06-25 MED ORDER — LORAZEPAM 2 MG/ML IJ SOLN: 0.0000 mg | Freq: Two times a day (BID) | INTRAMUSCULAR | Status: DC

## 2021-06-25 MED ORDER — FOLIC ACID 1 MG PO TABS: 1.0000 mg | ORAL_TABLET | Freq: Every day | ORAL | Status: DC

## 2021-06-25 MED ORDER — HYDROCHLOROTHIAZIDE 12.5 MG PO TABS
12.5000 mg | ORAL_TABLET | Freq: Every day | ORAL | Status: DC
  Administered 2021-06-26: 12.5 mg via ORAL
  Filled 2021-06-25: qty 1

## 2021-06-25 MED ORDER — FOLIC ACID 1 MG PO TABS
1.0000 mg | ORAL_TABLET | Freq: Every day | ORAL | Status: DC
  Administered 2021-06-25 – 2021-06-26 (×2): 1 mg via ORAL
  Filled 2021-06-25 (×2): qty 1

## 2021-06-25 MED ORDER — ACETAMINOPHEN 650 MG RE SUPP: 650.0000 mg | Freq: Four times a day (QID) | RECTAL | Status: DC | PRN
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
## 2021-06-25 NOTE — ED Notes (Signed)
See triage note  presents with swelling and bruising to right ankle and foot  states he also lost his nail to right great toe and 2nd toe  states he has neuropathy   unable to bear wt

## 2021-06-25 NOTE — ED Notes (Signed)
Pt was able to get someone to care for his animals. Pt agreed to stay and continue treatment plan. Dr Para March notified.

## 2021-06-25 NOTE — Assessment & Plan Note (Addendum)
ABI of bilateral lower extremities was within normal limits.  No evidence of DVT on venous duplex of the lower extremities.  Continue IV antibiotics.

## 2021-06-25 NOTE — Assessment & Plan Note (Addendum)
Continue aspirin and metoprolol 

## 2021-06-25 NOTE — ED Notes (Signed)
Entered patient room to start IV and collect blood samples. Pt stated that he has animals at home and his room mate is admitted in a different hospital. He indicated that he needed to go home to take care of the animals and would be back in 20 minutes. This nurse informed the patient that if he left he would be leaving AMA and if he returned he would have to check in to the ER again and start from the beginning. Pt stated that he had no choice. This nurse explained the concern for necrosis in his toe and the dangers of leaving before receiving treatment. Pt verbalized understanding and confirmed his intent to leave and then return to start the process over once his animals were cared for. Dr Para March notified.

## 2021-06-25 NOTE — Assessment & Plan Note (Signed)
Podiatry consult

## 2021-06-25 NOTE — Assessment & Plan Note (Addendum)
Patient had outpatient neurology work-up in fall 2022 including EMG and lumbar MRI Continue nortriptyline , Zanaflex.  Previously took gabapentin Uses a foot drop brace and uses a trekking pole

## 2021-06-25 NOTE — Assessment & Plan Note (Addendum)
Recent right buttock injury appears to have improved

## 2021-06-25 NOTE — Progress Notes (Signed)
Pharmacy Antibiotic Note  [name] is a 51 y.o. male admitted on 06/25/2021 with cellulitis of the right foot. Pharmacy has been consulted for vancomycin and cefepime dosing. Renal function is at apparent baseline  Plan:  1) start vancomycin 1750 mg IV x 1 then 1000 mg IV every 12 hours Goal AUC 400-550 Expected AUC: 466.9 SCr used: 0.80 mg/dL (rounded up) Ke: 0.370 h-1, T1/2: 7.5 h Daily SCr while on IV vancomycin  2) start cefepime 2 grams IV every 8 hours  Height: 5\' 8"  (172.7 cm) Weight: 93 kg (205 lb) IBW/kg (Calculated) : 68.4  Temp (24hrs), Avg:97.8 F (36.6 C), Min:97.8 F (36.6 C), Max:97.8 F (36.6 C)  Recent Labs  Lab 06/25/21 1629 06/25/21 2018  WBC 8.9  --   CREATININE 0.74  --   LATICACIDVEN  --  1.2    Estimated Creatinine Clearance: 120.8 mL/min (by C-G formula based on SCr of 0.74 mg/dL).    No Known Allergies  Antimicrobials this admission: vancomycin 02/02 >>  cefepime 02/02 >>   Microbiology results: 02/02 BCx: pending 02/02 UCx: pending   Thank you for allowing pharmacy to be a part of this patients care.  04/02 06/25/2021 9:25 PM

## 2021-06-25 NOTE — Assessment & Plan Note (Addendum)
Continue IV vancomycin and cefepime.  Analgesics as needed for pain.  Follow-up with podiatrist for further recommendations.
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
## 2021-06-25 NOTE — Assessment & Plan Note (Addendum)
Compensated.  No acute issues at this time. Completed antiviral treatment for hepatitis C

## 2021-06-25 NOTE — Progress Notes (Signed)
PHARMACY -  BRIEF ANTIBIOTIC NOTE   Pharmacy has received consult(s) for vancomycin and cefepime from an ED provider.  The patient's profile has been reviewed for ht/wt/allergies/indication/available labs.    One time order(s) placed for   1) cefepime 2 grams IV x 1  2) vancomycin 1750 mg IV x 1  Further antibiotics/pharmacy consults should be ordered by admitting physician if indicated.                       Thank you,  Lowella Bandy 06/25/2021  6:55 PM

## 2021-06-25 NOTE — ED Provider Notes (Signed)
[facility] Provider Note    Event Date/Time   First MD Initiated Contact with Patient 06/25/21 1813     (approximate)   History   Toe Pain   HPI  [name] is a 51 y.o. male with a past history of paroxysmal atrial fibrillation, cirrhosis secondary to alcoholism who comes to the ED complaining of right leg and toe pain.  He has neuropathy in the right foot which is mostly numb chronically.  He reports that over the last week he has been having right lower leg pain, and over the last 2 days he has noticed discoloration swelling and blackness of the first and second toes.  No fever.  No chest pain or shortness of breath.  Reviewed electronic medical record, patient was hospitalized in September 2021 with alcohol withdrawal with delirium tremens, requiring ICU management and intubation.  He has been noted to have compliance issues with lactulose therapy.     Physical Exam   Triage Vital Signs: ED Triage Vitals  Enc Vitals Group     BP 06/25/21 1620 (!) 145/83     Pulse Rate 06/25/21 1620 90     Resp 06/25/21 1620 18     Temp 06/25/21 1620 97.8 F (36.6 C)     Temp Source 06/25/21 1620 Oral     SpO2 06/25/21 1620 96 %     Weight 06/25/21 1621 205 lb (93 kg)     Height 06/25/21 1621 5\' 8"  (1.727 m)     Head Circumference --      Peak Flow --      Pain Score 06/25/21 1621 10     Pain Loc --      Pain Edu? --      Excl. in GC? --     Most recent vital signs: Vitals:   06/25/21 1620  BP: (!) 145/83  Pulse: 90  Resp: 18  Temp: 97.8 F (36.6 C)  SpO2: 96%     General: Awake, no distress.  CV:  Good peripheral perfusion.  Regular rate and rhythm.  Strong dorsalis pedis pulse.  Intact capillary refill up to margin of necrotic tissue on the first and second toes.  There are prominent multifocal systolic murmurs Resp:  Normal effort.  Clear to auscultation bilaterally. Abd:  No distention.  Soft nontender Other:  Right foot has erythema  and swelling of the toes and forefoot.  First and second toes have necrosis at the end with macerated and desquamated tissue proximately over the nailbeds and up to the MCP joints   ED Results / Procedures / Treatments   Labs (all labs ordered are listed, but only abnormal results are displayed) Labs Reviewed  CBC WITH DIFFERENTIAL/PLATELET - Abnormal; Notable for the following components:      Result Value   RBC 3.85 (*)    Hemoglobin 12.9 (*)    MCV 101.3 (*)    All other components within normal limits  COMPREHENSIVE METABOLIC PANEL - Abnormal; Notable for the following components:   Sodium 147 (*)    Chloride 113 (*)    Glucose, Bld 127 (*)    AST 78 (*)    All other components within normal limits  CULTURE, BLOOD (ROUTINE X 2)  CULTURE, BLOOD (ROUTINE X 2)  CULTURE, BLOOD (SINGLE)  LACTIC ACID, PLASMA  LACTIC ACID, PLASMA  AMMONIA     EKG     RADIOLOGY X-ray right foot reviewed and interpreted by me.  No obvious  osteomyelitis.  Radiology report reviewed.    PROCEDURES:  Critical Care performed: No  Procedures   MEDICATIONS ORDERED IN ED: Medications  sodium chloride 0.9 % bolus 1,000 mL (has no administration in time range)  ceFEPIme (MAXIPIME) 2 g in sodium chloride 0.9 % 100 mL IVPB (has no administration in time range)  metroNIDAZOLE (FLAGYL) IVPB 500 mg (has no administration in time range)  vancomycin (VANCOREADY) IVPB 1750 mg/350 mL (has no administration in time range)     IMPRESSION / MDM / ASSESSMENT AND PLAN / ED COURSE  I reviewed the triage vital signs and the nursing notes.                              Differential diagnosis includes, but is not limited to, osteomyelitis, septic embolism, DVT, endocarditis    Patient presents with necrosis of first and second toes with surrounding soft tissue infection.  X-ray does not show any obvious osteomyelitis, but there is high suspicion.  Additionally with his heart murmurs, worried about  endocarditis.  Will obtain 3 blood cultures, start on broad-spectrum antibiotics, admit to hospitalist for further management     FINAL CLINICAL IMPRESSION(S) / ED DIAGNOSES   Final diagnoses:  Other acute osteomyelitis of right foot (HCC)  Alcoholic cirrhosis of liver without ascites (HCC)     Rx / DC Orders   ED Discharge Orders     None        Note:  This document was prepared using Dragon voice recognition software and may include unintentional dictation errors.   Sharman Cheek, MD 06/25/21 [phone]

## 2021-06-25 NOTE — Assessment & Plan Note (Addendum)
Use Ativan as needed per CIWA protocol

## 2021-06-26 ENCOUNTER — Encounter: Payer: Self-pay | Admitting: Internal Medicine

## 2021-06-26 ENCOUNTER — Inpatient Hospital Stay: Payer: 59

## 2021-06-26 DIAGNOSIS — L03115 Cellulitis of right lower limb: Secondary | ICD-10-CM

## 2021-06-26 DIAGNOSIS — I10 Essential (primary) hypertension: Secondary | ICD-10-CM | POA: Diagnosis not present

## 2021-06-26 DIAGNOSIS — L97512 Non-pressure chronic ulcer of other part of right foot with fat layer exposed: Secondary | ICD-10-CM | POA: Diagnosis not present

## 2021-06-26 LAB — CBC WITH DIFFERENTIAL/PLATELET
Abs Immature Granulocytes: 0.01 10*3/uL (ref 0.00–0.07)
Basophils Absolute: 0.1 10*3/uL (ref 0.0–0.1)
Basophils Relative: 2 %
Eosinophils Absolute: 0.3 10*3/uL (ref 0.0–0.5)
Eosinophils Relative: 7 %
HCT: 36.4 % — ABNORMAL LOW (ref 39.0–52.0)
Hemoglobin: 11.9 g/dL — ABNORMAL LOW (ref 13.0–17.0)
Immature Granulocytes: 0 %
Lymphocytes Relative: 29 %
Lymphs Abs: 1.4 10*3/uL (ref 0.7–4.0)
MCH: 33.2 pg (ref 26.0–34.0)
MCHC: 32.7 g/dL (ref 30.0–36.0)
MCV: 101.7 fL — ABNORMAL HIGH (ref 80.0–100.0)
Monocytes Absolute: 0.6 10*3/uL (ref 0.1–1.0)
Monocytes Relative: 13 %
Neutro Abs: 2.3 10*3/uL (ref 1.7–7.7)
Neutrophils Relative %: 49 %
Platelets: 136 10*3/uL — ABNORMAL LOW (ref 150–400)
RBC: 3.58 MIL/uL — ABNORMAL LOW (ref 4.22–5.81)
RDW: 14.1 % (ref 11.5–15.5)
WBC: 4.8 10*3/uL (ref 4.0–10.5)
nRBC: 0 % (ref 0.0–0.2)

## 2021-06-26 LAB — BASIC METABOLIC PANEL
Anion gap: 6 (ref 5–15)
BUN: 11 mg/dL (ref 6–20)
CO2: 30 mmol/L (ref 22–32)
Calcium: 8.5 mg/dL — ABNORMAL LOW (ref 8.9–10.3)
Chloride: 106 mmol/L (ref 98–111)
Creatinine, Ser: 0.73 mg/dL (ref 0.61–1.24)
GFR, Estimated: >60 mL/min (ref >60)
Glucose, Bld: 107 mg/dL — ABNORMAL HIGH (ref 70–99)
Potassium: 3.9 mmol/L (ref 3.5–5.1)
Sodium: 142 mmol/L (ref 135–145)

## 2021-06-26 LAB — PREALBUMIN: Prealbumin: 13.8 mg/dL — ABNORMAL LOW (ref 18–38)

## 2021-06-26 LAB — C-REACTIVE PROTEIN: CRP: 0.6 mg/dL (ref <1.0)

## 2021-06-26 LAB — PHOSPHORUS: Phosphorus: 3.6 mg/dL (ref 2.5–4.6)

## 2021-06-26 LAB — HIV ANTIBODY (ROUTINE TESTING W REFLEX): HIV Screen 4th Generation wRfx: NONREACTIVE

## 2021-06-26 LAB — MAGNESIUM: Magnesium: 1.6 mg/dL — ABNORMAL LOW (ref 1.7–2.4)

## 2021-06-26 MED ORDER — SULFAMETHOXAZOLE-TRIMETHOPRIM 800-160 MG PO TABS: 1.0000 | ORAL_TABLET | Freq: Two times a day (BID) | ORAL | 0 refills | Status: AC

## 2021-06-26 MED ORDER — MORPHINE SULFATE (PF) 4 MG/ML IV SOLN
INTRAVENOUS | Status: AC
  Administered 2021-06-26: 2 mg
  Filled 2021-06-26: qty 1

## 2021-06-26 MED ORDER — AMOXICILLIN-POT CLAVULANATE 875-125 MG PO TABS: 1.0000 | ORAL_TABLET | Freq: Two times a day (BID) | ORAL | 0 refills | Status: AC

## 2021-06-26 MED ORDER — MAGNESIUM SULFATE 2 GM/50ML IV SOLN
2.0000 g | Freq: Once | INTRAVENOUS | Status: AC
  Administered 2021-06-26: 2 g via INTRAVENOUS
  Filled 2021-06-26: qty 50

## 2021-06-26 NOTE — Progress Notes (Signed)
Vitals entered manually- dynamap wouldn't validate °

## 2021-06-26 NOTE — TOC Progression Note (Signed)
Transition of Care [facility]) - Progression Note    Patient Details  Name: Zebbie Ace MRN: [id number] Date of Birth: [birth year]  [facility] of [facility]) CM/SW Contact  Marlowe Sax, RN Phone Number: 06/26/2021, 9:46 AM  Clinical Narrative:    From Home with a room mate that is admitted in another hospital, Independent at home, Has a PCP, drives,   Gets medication at [facility] pharmacy   Transition of Care [facility]) Screening Note   Patient Details  Name: Barret Esquivel Date of Birth: [birth year]   Transition of Care [facility]) CM/SW Contact:    Marlowe Sax, RN Phone Number: 06/26/2021, 9:47 AM    Transition of Care Department [facility]) has reviewed patient and no TOC needs have been identified at this time. We will continue to monitor patient advancement through interdisciplinary progression rounds. If new patient transition needs arise, please place a TOC consult.      Expected Discharge Plan and Services                                                 Social Determinants of Health (SDOH) Interventions    Readmission Risk Interventions No flowsheet data found.

## 2021-06-26 NOTE — Plan of Care (Signed)
  Problem: Clinical Measurements: Goal: Will remain free from infection Outcome: Progressing   Problem: Clinical Measurements: Goal: Diagnostic test results will improve Outcome: Progressing   Problem: Clinical Measurements: Goal: Cardiovascular complication will be avoided Outcome: Progressing   

## 2021-06-26 NOTE — Assessment & Plan Note (Signed)
Replete magnesium with IV magnesium sulfate.
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
## 2021-06-26 NOTE — Progress Notes (Signed)
Pharmacy Antibiotic Note  [name] is a 51 y.o. male admitted on 06/25/2021 with cellulitis of the right foot. Pharmacy has been consulted for vancomycin and cefepime dosing. Renal function is at apparent baseline  Plan: Vancomycin 1750 mg IV x 1 then 1000 mg IV every 12 hours Goal AUC 400-550 Expected AUC: 466.9 SCr used: 0.80 mg/dL (rounded up) Ke: 0.092 h-1, T1/2: 7.5 h  Continue cefepime 2 grams IV every 8 hours  Monitor clinical picture, renal function, and vancomycin levels at steady state F/U C&S, abx deescalation / LOT   Height: 5\' 8"  (172.7 cm) Weight: 92.6 kg (204 lb 3.2 oz) IBW/kg (Calculated) : 68.4  Temp (24hrs), Avg:98.2 F (36.8 C), Min:97.8 F (36.6 C), Max:98.3 F (36.8 C)  Recent Labs  Lab 06/25/21 1629 06/25/21 2018 06/25/21 2242 06/26/21 0854  WBC 8.9  --   --  4.8  CREATININE 0.74  --   --  0.73  LATICACIDVEN  --  1.2 1.1  --      Estimated Creatinine Clearance: 120.7 mL/min (by C-G formula based on SCr of 0.73 mg/dL).    No Known Allergies  Antimicrobials this admission: vancomycin 02/02 >>  cefepime 02/02 >>   Microbiology results: 02/02 BCx: pending 02/02 UCx: pending   Thank you for allowing pharmacy to be a part of this patients care.  Darnelle Bos, PharmD 06/26/2021 2:09 PM
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
## 2021-06-26 NOTE — Progress Notes (Signed)
Pt cleared by Podiatry to f/u out pt and perform off loading and wound dressing with betadine and gauze. Pt wanting to go home Dr. Myriam Forehand secured chatted by nurse and charge nurse and also paged about d/c orders. Pt states if he does not get his d/c orders he is going to leave AMA, PM nurse aware and present with AM nurse at bedside when pt stated that.

## 2021-06-26 NOTE — Discharge Summary (Signed)
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
## 2021-06-27 LAB — HEMOGLOBIN A1C
Hgb A1c MFr Bld: 5 % (ref 4.8–5.6)
Mean Plasma Glucose: 97 mg/dL
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
## 2021-07-06 LAB — CULTURE, BLOOD (ROUTINE X 2)
Culture: NO GROWTH
Culture: NO GROWTH
Special Requests: ADEQUATE

## 2021-07-06 LAB — CULTURE, BLOOD (SINGLE): Culture: NO GROWTH

## 2021-07-07 IMAGING — CT CT PELVIS W/O CM
2 of 3 series · 15 of 46 positions shown, 17 images · non-contrast
Comparison: None.

CLINICAL DATA: Pain, question fracture

EXAM:
CT PELVIS WITHOUT CONTRAST
TECHNIQUE: Multidetector CT imaging of the pelvis was performed following the
standard protocol without intravenous contrast.

[Series 3: axial st · axial · 0.95mm/px · z∈[-1084,-872]mm · 12 of 122 slices shown, 14 images]
[im 8/122  soft-tissue]
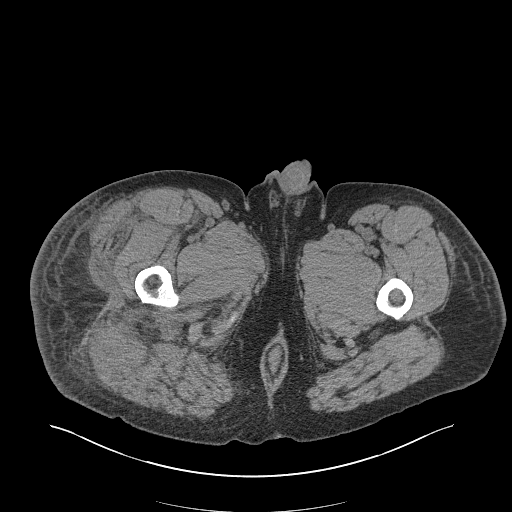
[im 8/122  bone]
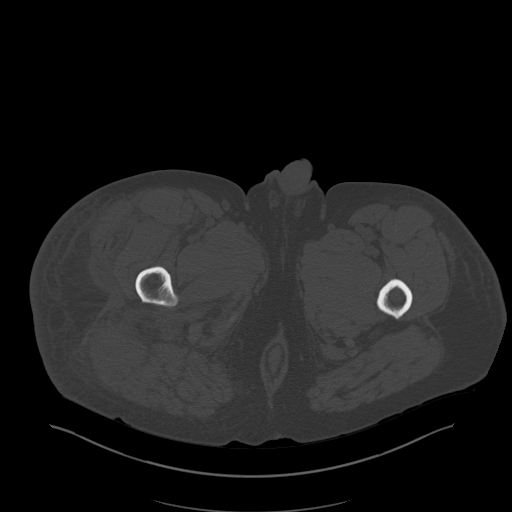
[im 16/122  soft-tissue]
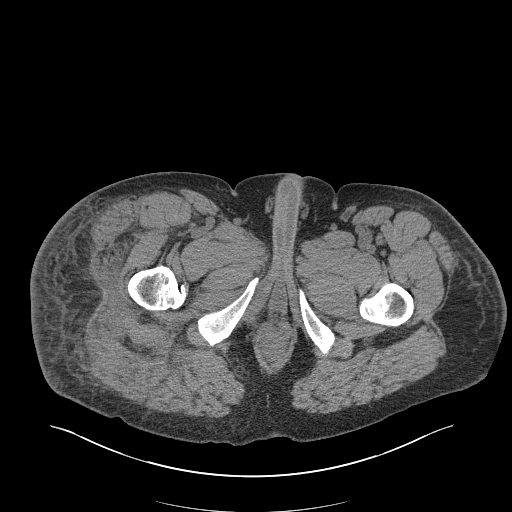
[im 28/122  soft-tissue]
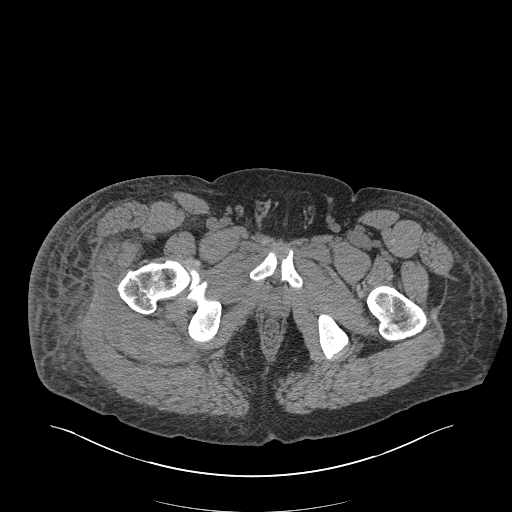
[im 36/122  soft-tissue]
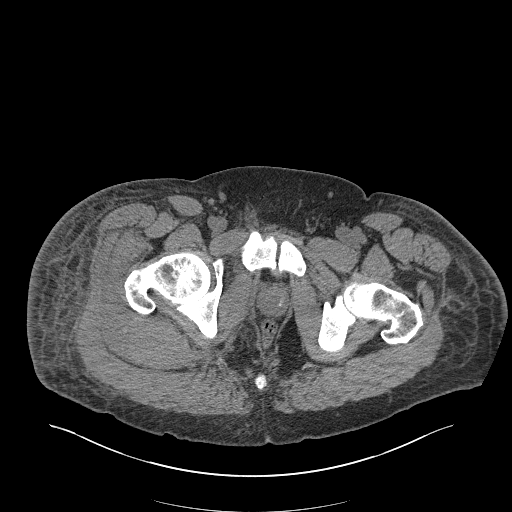
[im 47/122  soft-tissue]
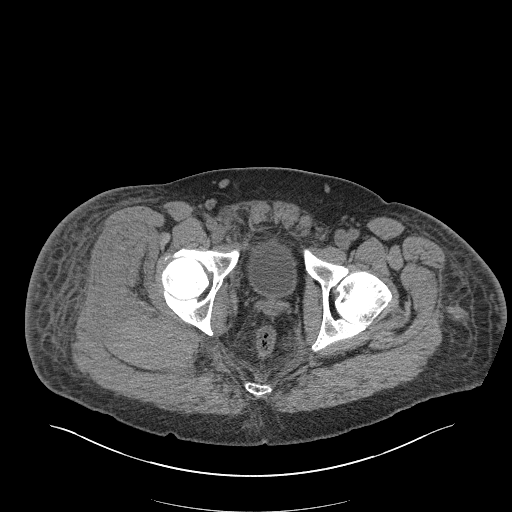
[im 55/122  soft-tissue]
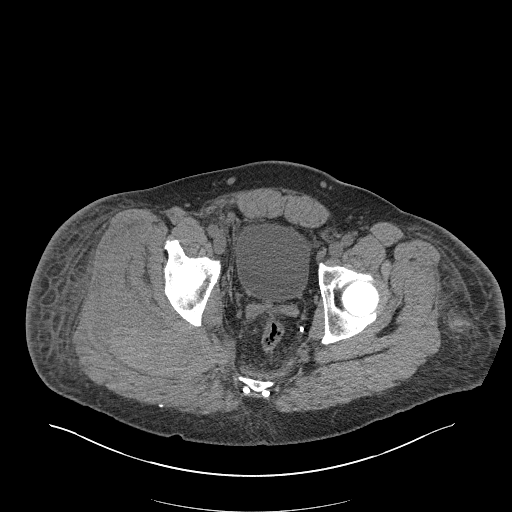
[im 67/122  soft-tissue]
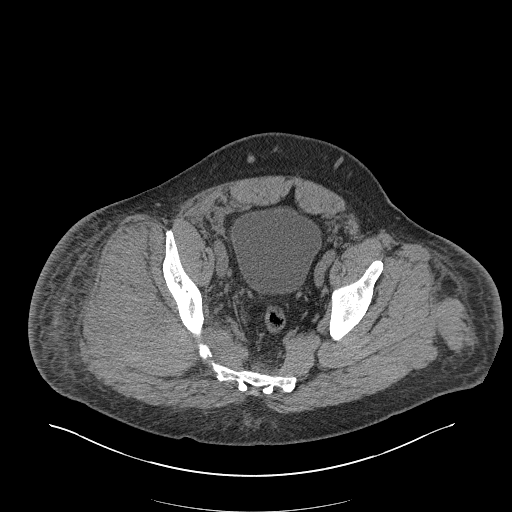
[im 75/122  soft-tissue]
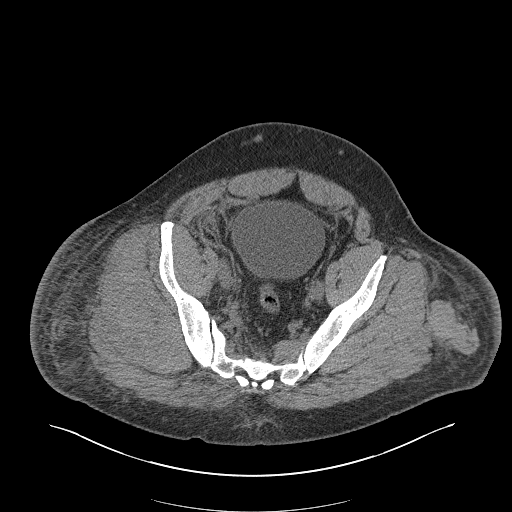
[im 86/122  soft-tissue]
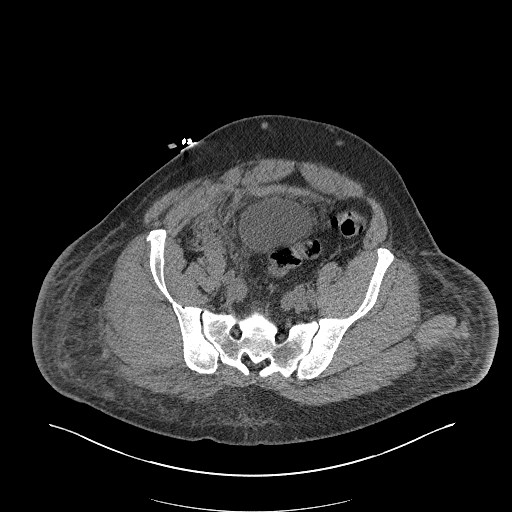
[im 86/122  bone]
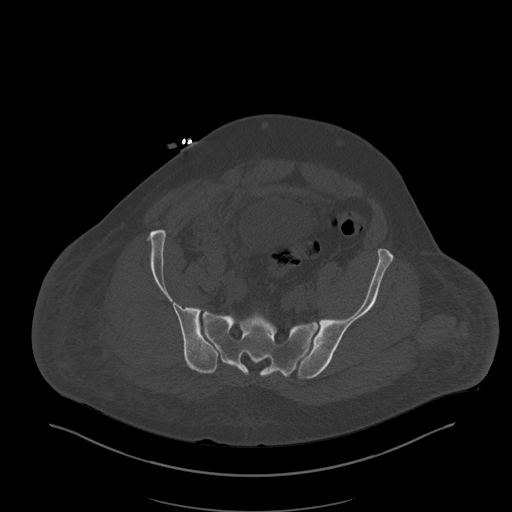
[im 94/122  soft-tissue]
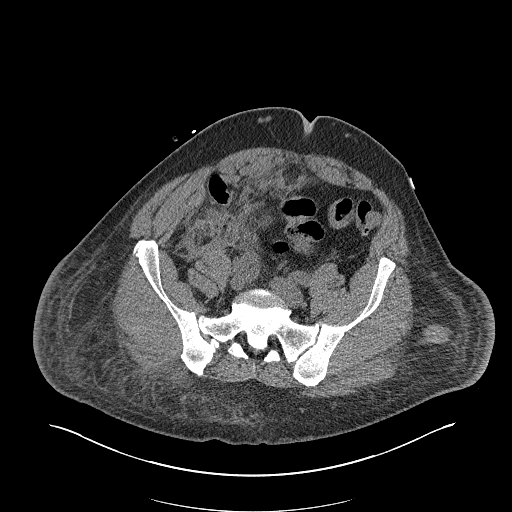
[im 106/122  soft-tissue]
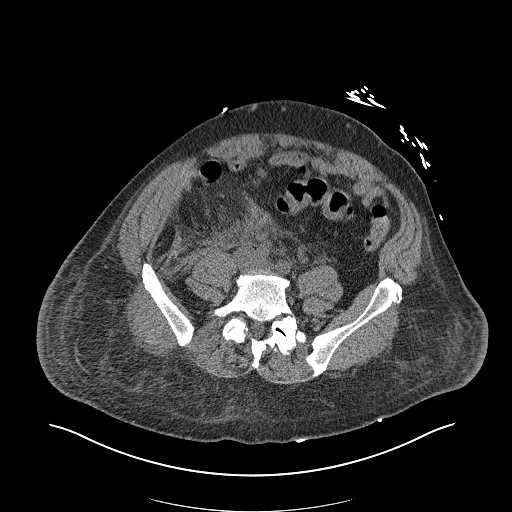
[im 114/122  soft-tissue]
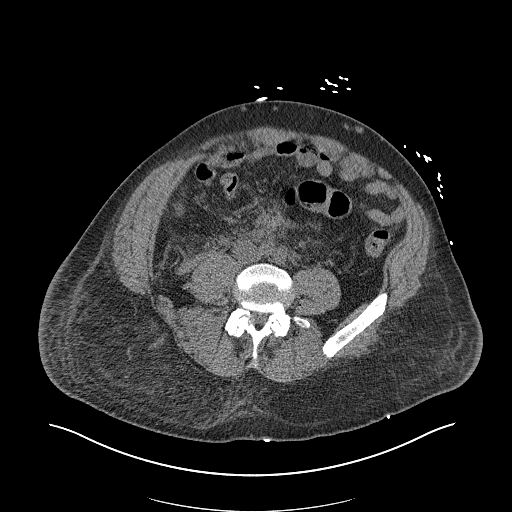

[Series 6: coronal st · coronal · 0.53mm/px · 3 of 117 slices shown]
[im 39/117  soft-tissue]
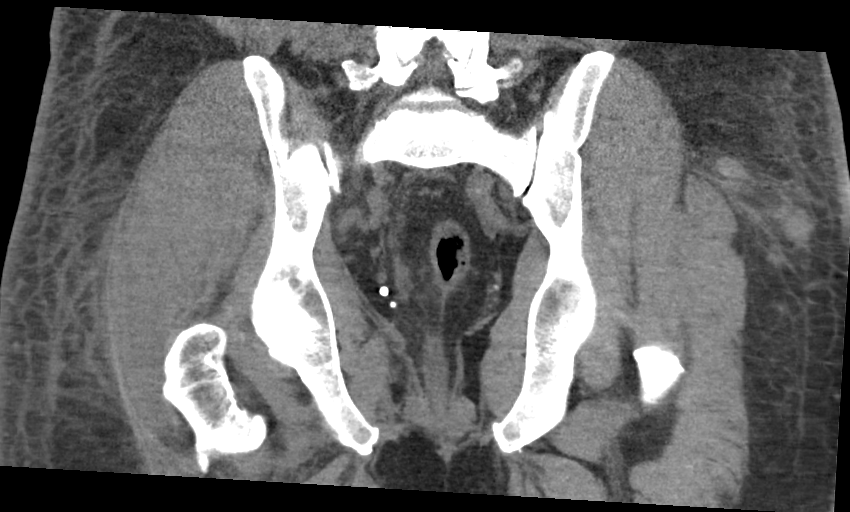
[im 52/117  soft-tissue]
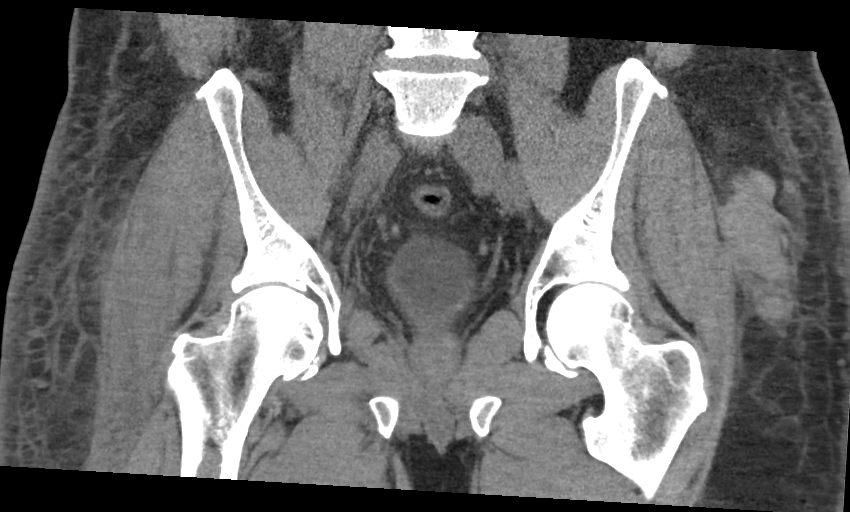
[im 65/117  soft-tissue]
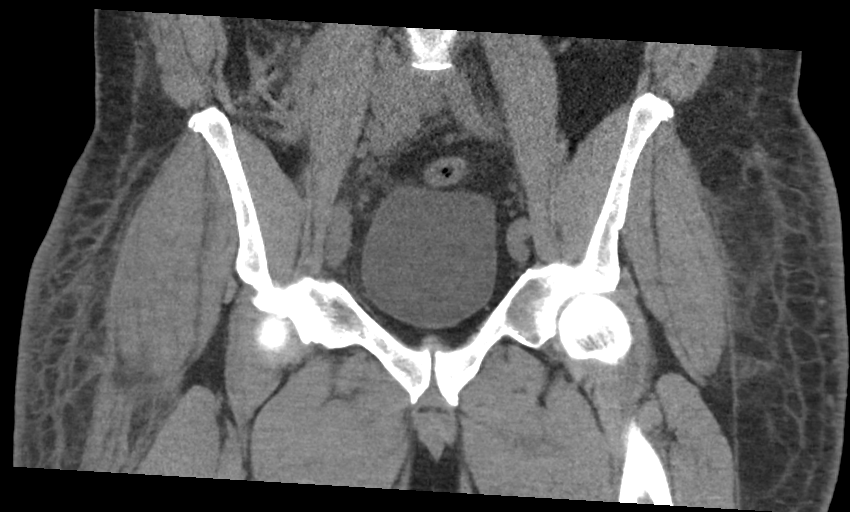

[15 of 46 positions shown; findings below may reference images not displayed]

FINDINGS: Urinary Tract: The visualized distal ureters and bladder appear
unremarkable.

Bowel: No bowel wall thickening, distention or surrounding
inflammation identified within the pelvis.

Vascular/Lymphatic: No enlarged pelvic lymph nodes identified. No
significant vascular findings.

Reproductive: The prostate is unremarkable.

Other: Bilateral subcutaneous edema seen around the hips.

Musculoskeletal: No fracture or dislocation. There is moderate
bilateral hip osteoarthritis with superior joint space loss and
marginal osteophyte formation. There is a large heterogeneous
hyperdense areas seen throughout the right gluteal musculature with
expansion most notable within the gluteus medius muscle belly,
likely intramuscular hematoma. There is also a small subcutaneous
hyperdense collections seen overlying the left gluteal musculature,
likely superficial hematomas.
IMPRESSION: No acute osseous abnormality.

Large intramuscular hematoma within the right gluteal musculature.
Overlying subcutaneous edema and skin thickening.

superficial subcutaneous small hematoma seen overlying the left
gluteal musculature.

## 2021-07-10 ENCOUNTER — Other Ambulatory Visit: Payer: Self-pay

## 2021-07-10 ENCOUNTER — Other Ambulatory Visit: Payer: Self-pay | Admitting: Podiatry

## 2021-07-10 ENCOUNTER — Ambulatory Visit: Payer: 59 | Admitting: Podiatry

## 2021-07-10 ENCOUNTER — Encounter: Payer: Self-pay | Admitting: Podiatry

## 2021-07-10 DIAGNOSIS — L97502 Non-pressure chronic ulcer of other part of unspecified foot with fat layer exposed: Secondary | ICD-10-CM | POA: Diagnosis not present

## 2021-07-10 MED ORDER — GENTAMICIN SULFATE 0.1 % EX CREA: 1.0000 "application " | TOPICAL_CREAM | Freq: Two times a day (BID) | CUTANEOUS | 1 refills | Status: DC

## 2021-07-10 MED ORDER — SULFAMETHOXAZOLE-TRIMETHOPRIM 800-160 MG PO TABS: 1.0000 | ORAL_TABLET | Freq: Two times a day (BID) | ORAL | 0 refills | Status: DC

## 2021-07-10 MED ORDER — IBUPROFEN 800 MG PO TABS: 800.0000 mg | ORAL_TABLET | Freq: Three times a day (TID) | ORAL | 1 refills | Status: DC

## 2021-07-11 IMAGING — DX DG CHEST 1V PORT
1 series · 2 of 2 positions shown · non-contrast
Comparison: None.

CLINICAL DATA: Endotracheal tube placement for respiratory failure.
Acute toxic metabolic encephalopathy. Acute liver failure
complicated by rhabdomyolysis.

EXAM:
PORTABLE CHEST 1 VIEW

[Series 1: chest ap · 0.14mm/px · 2 of 2 slices shown]
[im 1/2]
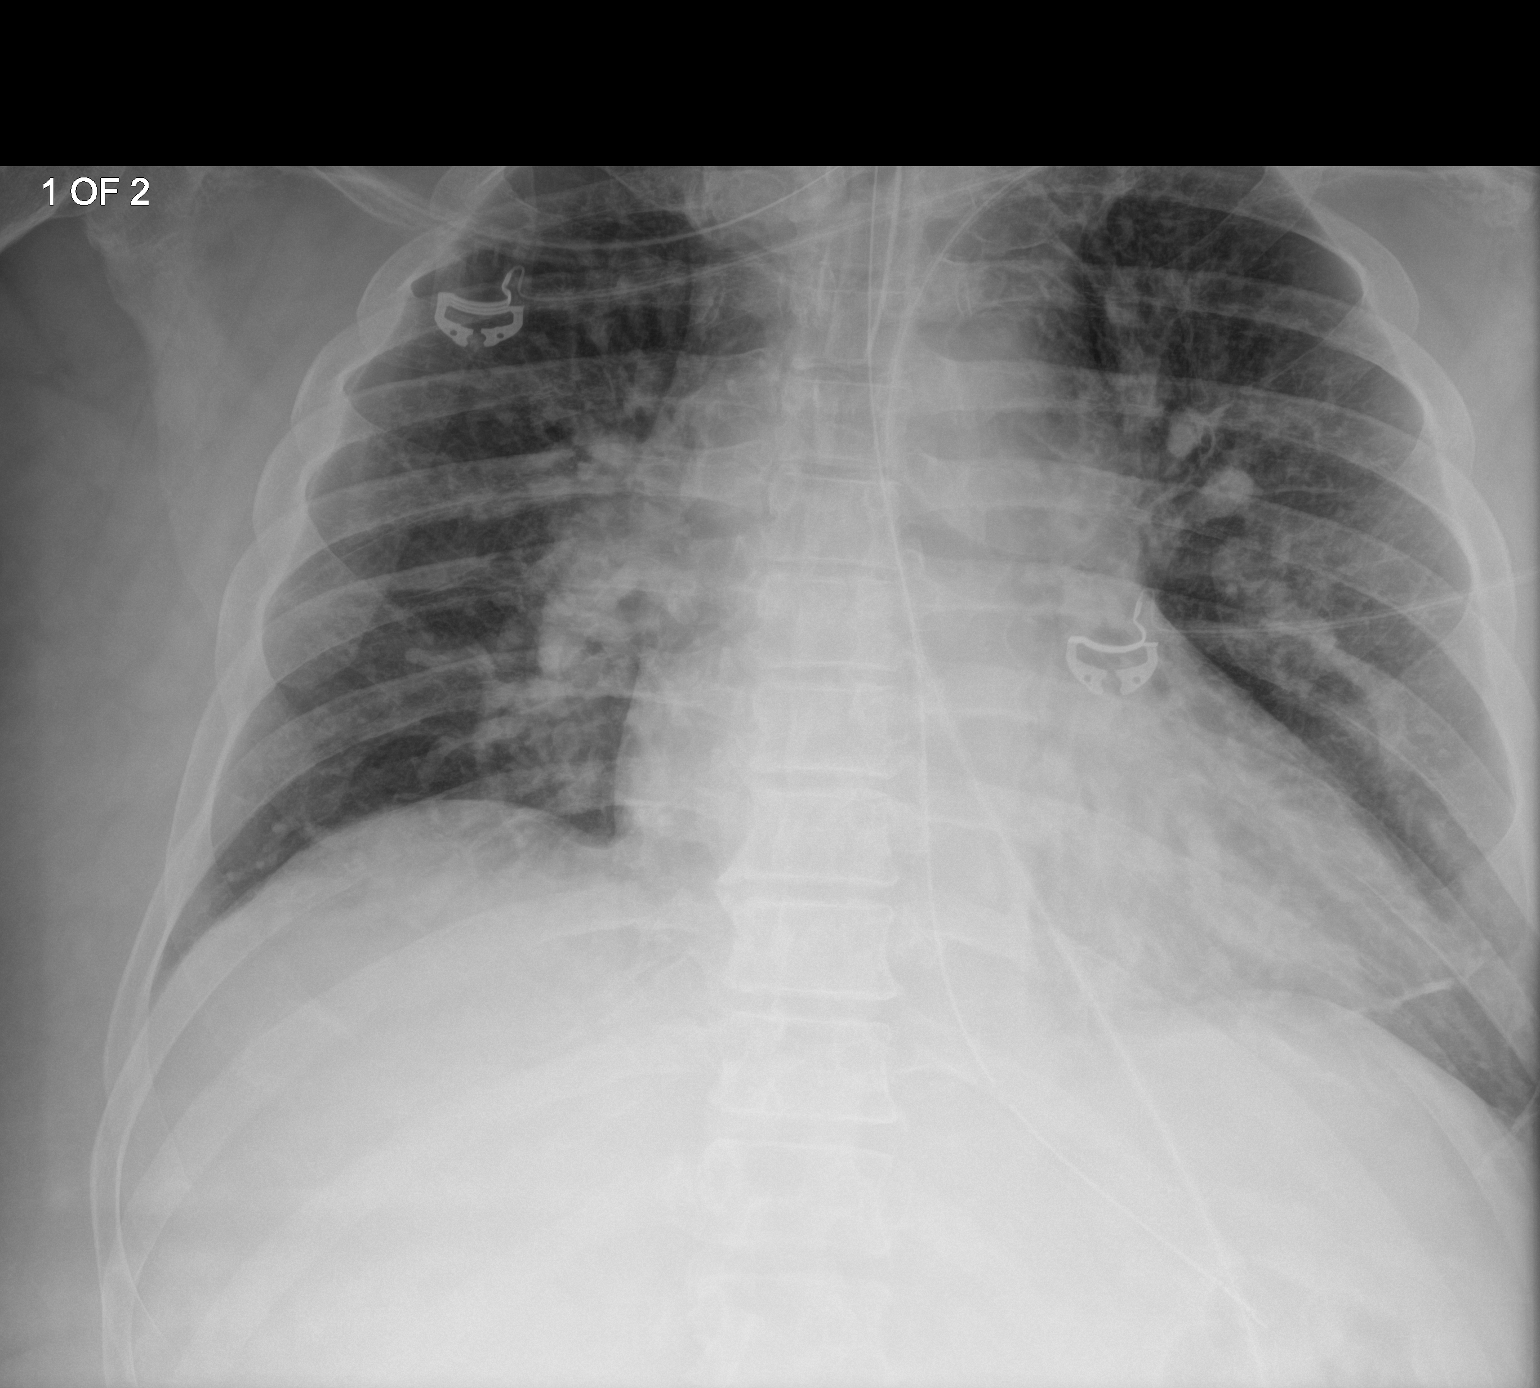
[im 2/2]
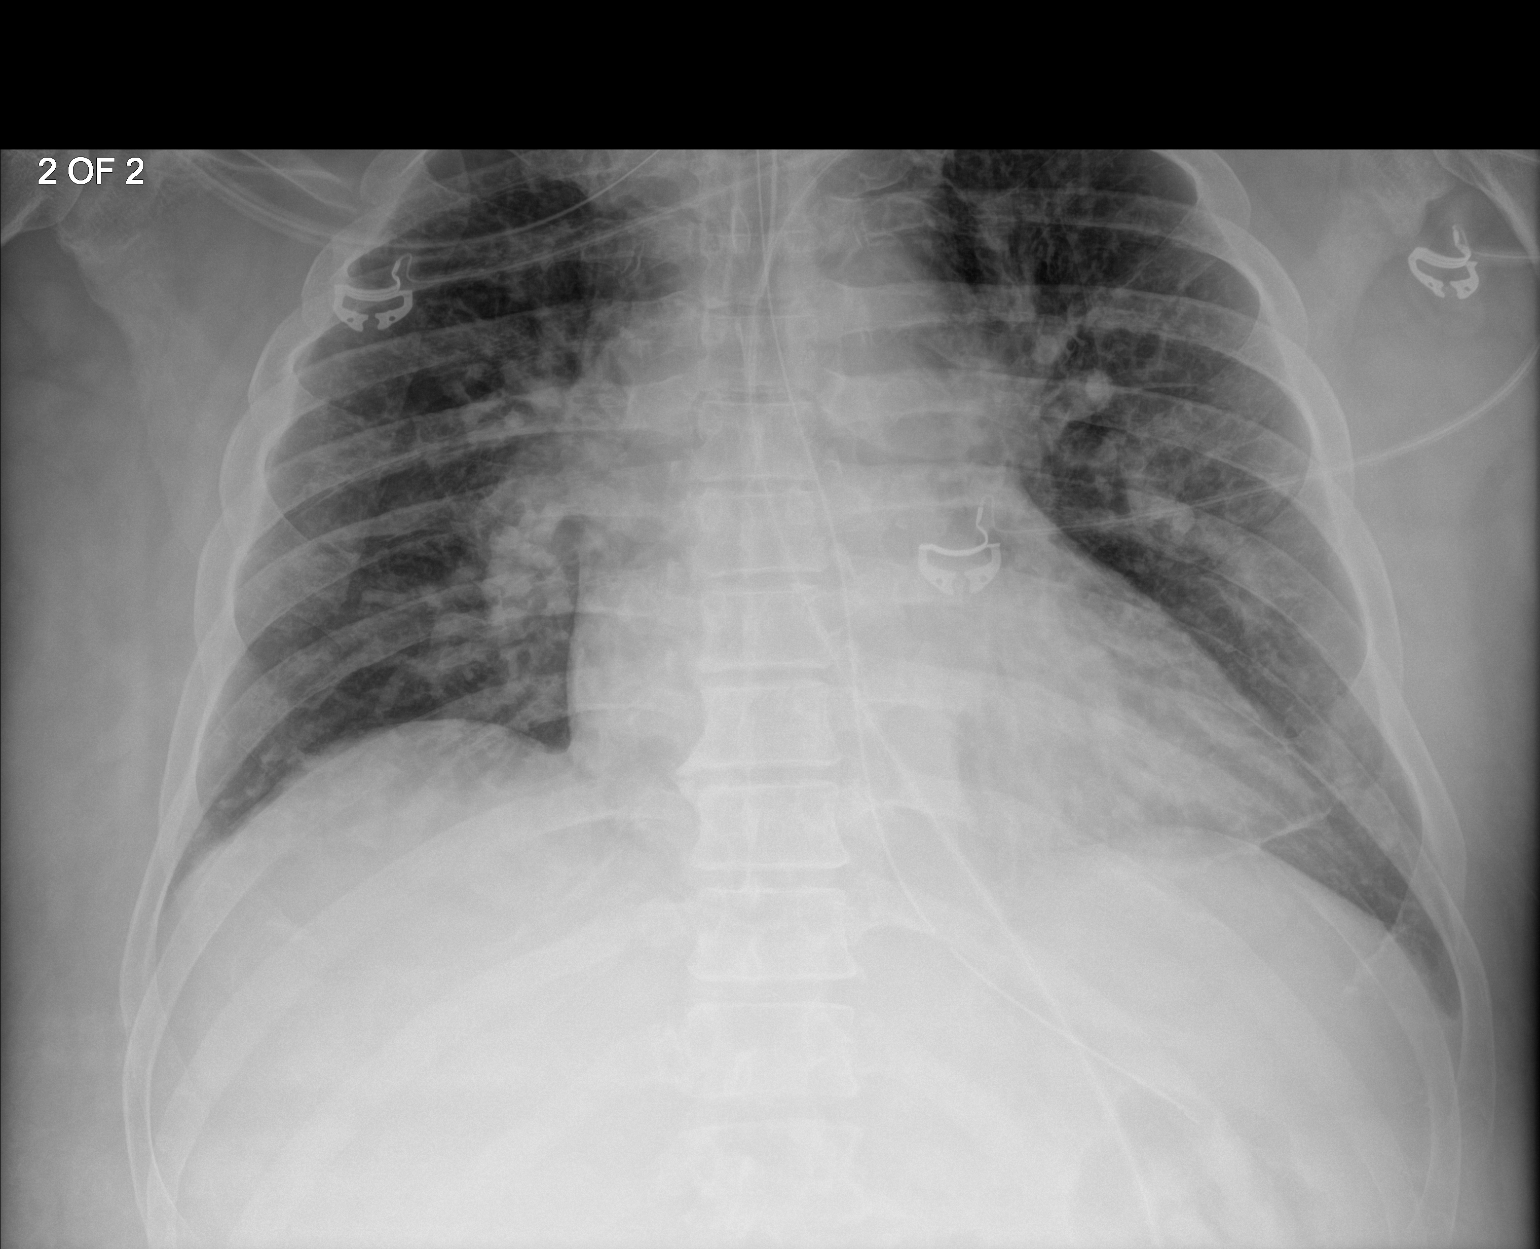

[2 of 2 positions shown; findings below may reference images not displayed]

FINDINGS: Endotracheal tube tip is 2.7 cm above the carina, satisfactorily
position. Nasogastric tube side port is in the vicinity of the
gastric cardia with distal port in the stomach body.

Mild enlargement of the cardiopericardial silhouette noted with
indistinct pulmonary vasculature and cephalization of blood flow
suspicious for pulmonary venous hypertension. Borderline appearance
for interstitial edema.
IMPRESSION: 1. Endotracheal tube tip is 2.7 cm above the carina, satisfactorily
positioned.
2. Mild enlargement of the cardiopericardial silhouette with
suspected pulmonary venous hypertension and borderline appearance
for interstitial edema.

## 2021-07-16 LAB — WOUND CULTURE

## 2021-07-17 IMAGING — DX DG PORTABLE PELVIS
2 series · 2 of 2 positions shown · non-contrast
Comparison: None.

CLINICAL DATA: Fall.

EXAM:
PORTABLE PELVIS 1-2 VIEWS

[pelvis ap (1 of 2)]
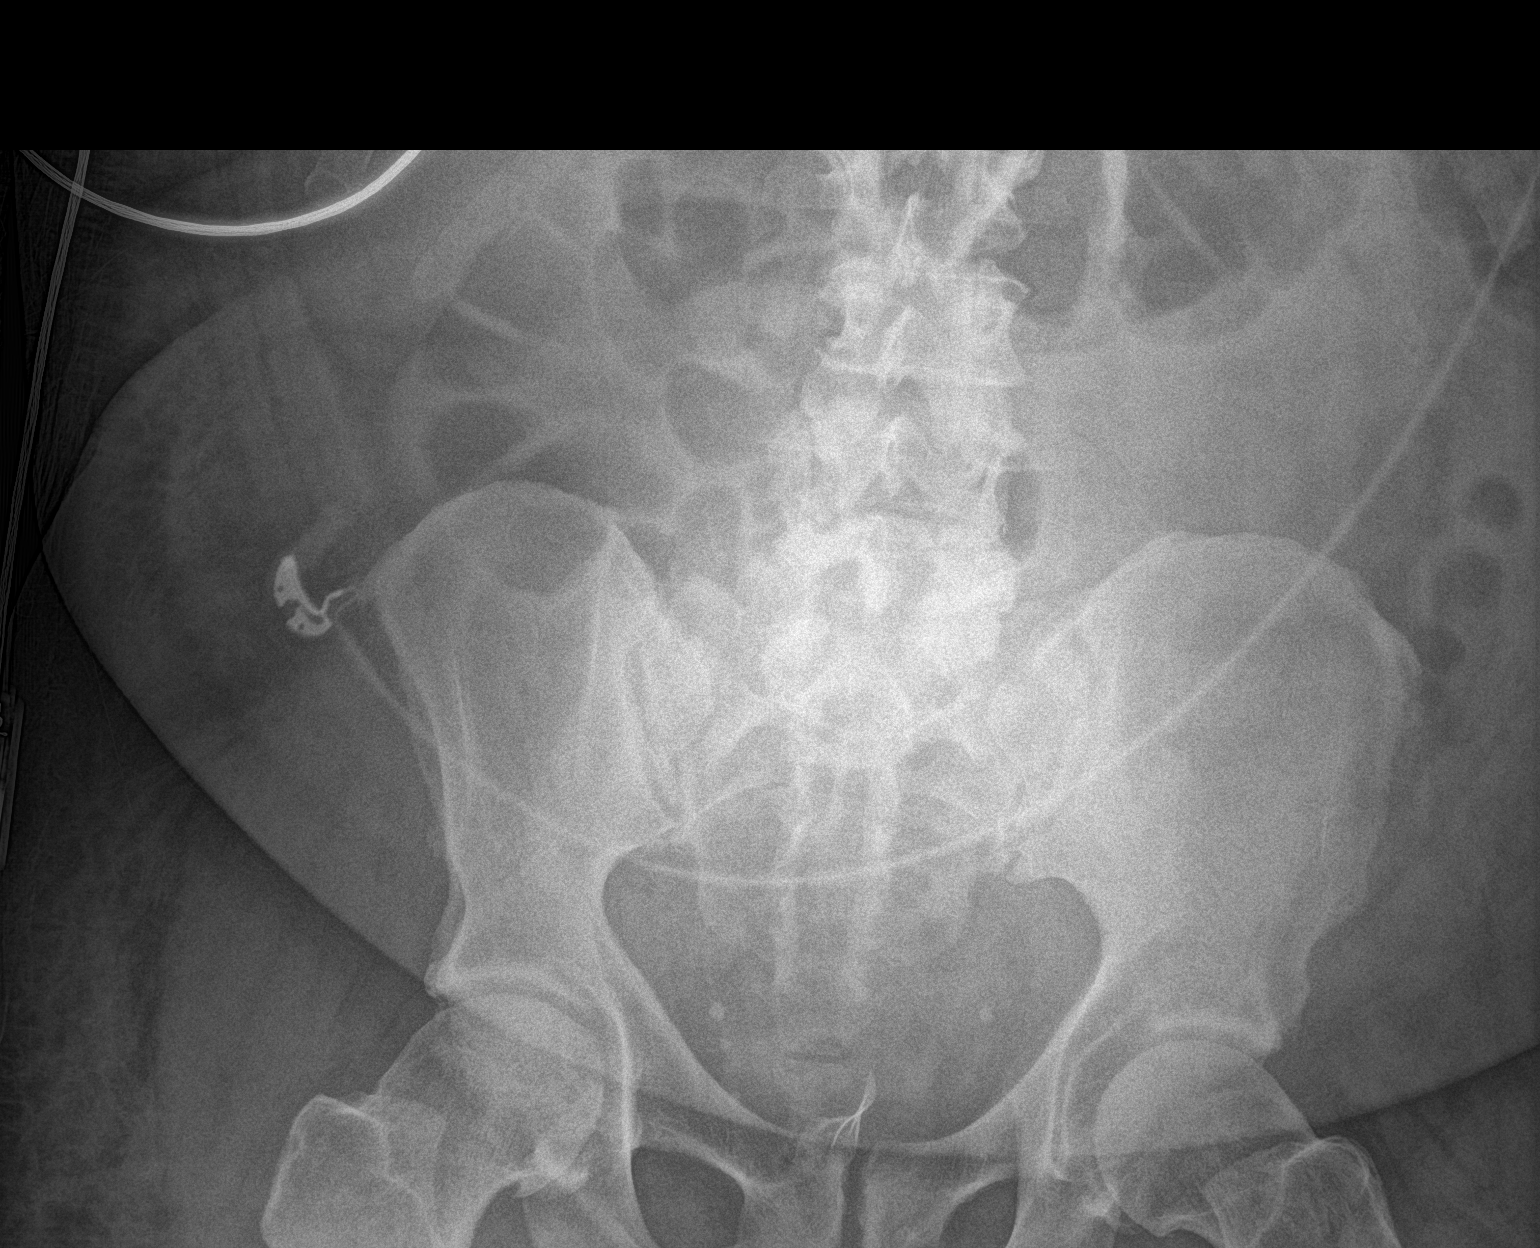

[pelvis ap (2 of 2)]
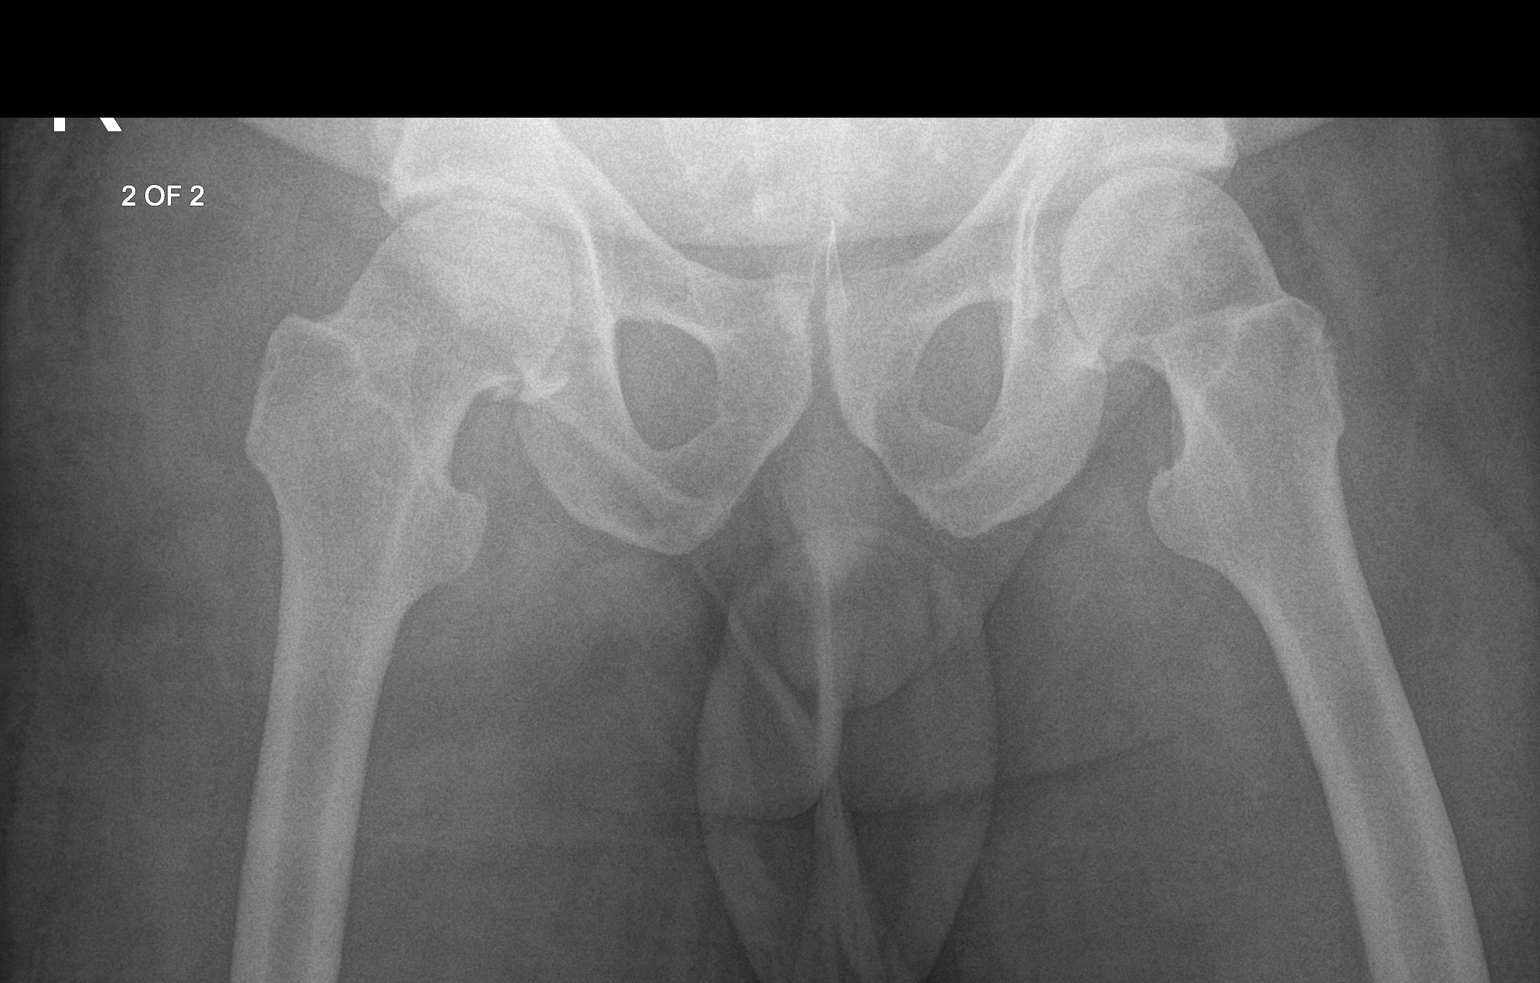

[2 of 2 positions shown; findings below may reference images not displayed]

FINDINGS: Hips are located.  No pelvic fracture or sacral fracture.

Radiodense ribbon like foreign body projects over the pubic
symphysis.
IMPRESSION: 1. No pelvic fracture.
2. Radiopaque foreign body projects over the pubic symphysis.
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
## 2021-07-24 ENCOUNTER — Other Ambulatory Visit: Payer: Self-pay

## 2021-07-24 ENCOUNTER — Ambulatory Visit: Payer: 59 | Admitting: Podiatry

## 2021-07-24 DIAGNOSIS — L97512 Non-pressure chronic ulcer of other part of right foot with fat layer exposed: Secondary | ICD-10-CM | POA: Diagnosis not present

## 2021-07-24 NOTE — Progress Notes (Signed)
? ?  HPI: 51 y.o. male presenting today for follow-up evaluation of an ulcer to the right great toe.  He says that he wears the cam boot is much as possible.  He takes ibuprofen 800 mg for the pain.  He has completed the Bactrim DS and is no longer currently on antibiotic medication.  Presents for further treatment and evaluation ? ?Past Medical History:  ?Diagnosis Date  ? Alcohol abuse   ? Anemia   ? a. 01/2020 s/p fall w/ significant R hip hematoma-->4 u prbcs during admission.  ? Delirium tremens (HCC) 01/2020  ? Hepatic failure (HCC)   ? a. 01/2020 - ETOH/sepsis/rhabdo  ? Polysubstance abuse (HCC)   ? Respiratory failure (HCC)   ? a. 01/2020 in setting of DTs/Sepsis req intubation.  ? Rhabdomyolysis 01/2020  ? Sepsis (HCC) 01/2020  ? ? ?No past surgical history on file. ? ?No Known Allergies ? ?  ?Physical Exam: ?General: The patient is alert and oriented x3 in no acute distress. ? ?Dermatology: There continues to be an ulcer noted to the right great toe measuring approximately 2.0 x 3.0 x 0.2 cm.  There is less fibrotic tissue noted to the wound base today and there is more of a granular wound base which appears healthy and viable.  Although the size of the wound has mostly unchanged.  There is no exposed bone muscle tendon ligament or joint.  No malodor.  Mild serosanguineous drainage. ? ?Vascular: Palpable pedal pulses bilaterally. Capillary refill within normal limits.  Negative for any significant edema or erythema ? ?Neurological: Light touch and protective threshold grossly intact ? ?Musculoskeletal Exam: Dropfoot deformity noted right lower extremity ? ?Radiographic Exam: Last x-rays taken 06/25/2021 of the right foot: Distal soft tissue swelling without underlying bony abnormality. ? ?Assessment: ?1.  Ulcer right great toe fat layer exposed ? ? ?Plan of Care:  ?1. Patient evaluated.  Cultures reviewed today demonstrating routine flora ?2.  Medically necessary excisional debridement including subcutaneous  tissue was performed using a tissue nipper.  Excisional debridement of the necrotic nonviable tissue down to healthier bleeding viable tissue was performed with postdebridement measurement same as pre- ?3.  Patient completed the oral Bactrim DS.  We will not represcribe for the moment ?4.  Continue gentamicin cream and light dressing ?5.  Continue short cam boot.  Weightbearing as tolerated  ?6.  Return to clinic 3 weeks ?  ?  ?Felecia Shelling, DPM ?[facility] ? ?Dr. Felecia Shelling, DPM  ?  ?[address]                                        ?[place], [place] [address]                ?Office [phone]  ?Fax [phone] ? ? ? ? ?

## 2021-08-03 ENCOUNTER — Inpatient Hospital Stay: Payer: 59 | Admitting: Anesthesiology

## 2021-08-03 ENCOUNTER — Observation Stay
Admission: EM | Admit: 2021-08-03 | Discharge: 2021-08-04 | Disposition: A | Discharge status: Home / Self Care | Payer: 59 | Attending: Internal Medicine | Admitting: Internal Medicine

## 2021-08-03 ENCOUNTER — Other Ambulatory Visit: Payer: Self-pay

## 2021-08-03 ENCOUNTER — Encounter: Payer: Self-pay | Admitting: Emergency Medicine

## 2021-08-03 ENCOUNTER — Encounter
Admission: EM | Disposition: A | Discharge status: Home / Self Care | Payer: Self-pay | Source: Home / Self Care | Attending: Emergency Medicine

## 2021-08-03 DIAGNOSIS — K7030 Alcoholic cirrhosis of liver without ascites: Secondary | ICD-10-CM

## 2021-08-03 DIAGNOSIS — Z79899 Other long term (current) drug therapy: Secondary | ICD-10-CM | POA: Diagnosis not present

## 2021-08-03 DIAGNOSIS — K2900 Acute gastritis without bleeding: Secondary | ICD-10-CM | POA: Diagnosis not present

## 2021-08-03 DIAGNOSIS — Z7982 Long term (current) use of aspirin: Secondary | ICD-10-CM | POA: Insufficient documentation

## 2021-08-03 DIAGNOSIS — K922 Gastrointestinal hemorrhage, unspecified: Secondary | ICD-10-CM | POA: Diagnosis not present

## 2021-08-03 DIAGNOSIS — R69 Illness, unspecified: Secondary | ICD-10-CM | POA: Diagnosis not present

## 2021-08-03 DIAGNOSIS — K279 Peptic ulcer, site unspecified, unspecified as acute or chronic, without hemorrhage or perforation: Secondary | ICD-10-CM | POA: Diagnosis not present

## 2021-08-03 DIAGNOSIS — Z20822 Contact with and (suspected) exposure to covid-19: Secondary | ICD-10-CM | POA: Insufficient documentation

## 2021-08-03 DIAGNOSIS — D62 Acute posthemorrhagic anemia: Secondary | ICD-10-CM | POA: Diagnosis not present

## 2021-08-03 DIAGNOSIS — E876 Hypokalemia: Secondary | ICD-10-CM

## 2021-08-03 DIAGNOSIS — K7460 Unspecified cirrhosis of liver: Secondary | ICD-10-CM | POA: Diagnosis present

## 2021-08-03 DIAGNOSIS — L97502 Non-pressure chronic ulcer of other part of unspecified foot with fat layer exposed: Secondary | ICD-10-CM | POA: Diagnosis not present

## 2021-08-03 DIAGNOSIS — F32A Depression, unspecified: Secondary | ICD-10-CM | POA: Diagnosis present

## 2021-08-03 DIAGNOSIS — I480 Paroxysmal atrial fibrillation: Secondary | ICD-10-CM | POA: Diagnosis present

## 2021-08-03 DIAGNOSIS — K2970 Gastritis, unspecified, without bleeding: Secondary | ICD-10-CM | POA: Diagnosis not present

## 2021-08-03 DIAGNOSIS — R9431 Abnormal electrocardiogram [ECG] [EKG]: Secondary | ICD-10-CM | POA: Diagnosis not present

## 2021-08-03 DIAGNOSIS — K921 Melena: Secondary | ICD-10-CM | POA: Diagnosis not present

## 2021-08-03 DIAGNOSIS — B182 Chronic viral hepatitis C: Secondary | ICD-10-CM | POA: Diagnosis present

## 2021-08-03 DIAGNOSIS — K920 Hematemesis: Secondary | ICD-10-CM | POA: Diagnosis not present

## 2021-08-03 DIAGNOSIS — K269 Duodenal ulcer, unspecified as acute or chronic, without hemorrhage or perforation: Secondary | ICD-10-CM | POA: Diagnosis not present

## 2021-08-03 HISTORY — PX: ESOPHAGOGASTRODUODENOSCOPY (EGD) WITH PROPOFOL: SHX5813

## 2021-08-03 LAB — COMPREHENSIVE METABOLIC PANEL
ALT: 53 U/L — ABNORMAL HIGH (ref 0–44)
AST: 89 U/L — ABNORMAL HIGH (ref 15–41)
Albumin: 4.3 g/dL (ref 3.5–5.0)
Alkaline Phosphatase: 85 U/L (ref 38–126)
Anion gap: 8 (ref 5–15)
BUN: 22 mg/dL — ABNORMAL HIGH (ref 6–20)
CO2: 25 mmol/L (ref 22–32)
Calcium: 8.9 mg/dL (ref 8.9–10.3)
Chloride: 103 mmol/L (ref 98–111)
Creatinine, Ser: 0.92 mg/dL (ref 0.61–1.24)
GFR, Estimated: >60 mL/min (ref >60)
Glucose, Bld: 117 mg/dL — ABNORMAL HIGH (ref 70–99)
Potassium: 3.4 mmol/L — ABNORMAL LOW (ref 3.5–5.1)
Sodium: 136 mmol/L (ref 135–145)
Total Bilirubin: 1.5 mg/dL — ABNORMAL HIGH (ref 0.3–1.2)
Total Protein: 7.5 g/dL (ref 6.5–8.1)

## 2021-08-03 LAB — CBC
HCT: 29.5 % — ABNORMAL LOW (ref 39.0–52.0)
Hemoglobin: 9.8 g/dL — ABNORMAL LOW (ref 13.0–17.0)
MCH: 32.6 pg (ref 26.0–34.0)
MCHC: 33.2 g/dL (ref 30.0–36.0)
MCV: 98 fL (ref 80.0–100.0)
Platelets: 257 10*3/uL (ref 150–400)
RBC: 3.01 MIL/uL — ABNORMAL LOW (ref 4.22–5.81)
RDW: 12.9 % (ref 11.5–15.5)
WBC: 12 10*3/uL — ABNORMAL HIGH (ref 4.0–10.5)
nRBC: 0 % (ref 0.0–0.2)

## 2021-08-03 LAB — URINALYSIS, ROUTINE W REFLEX MICROSCOPIC
Bilirubin Urine: NEGATIVE
Glucose, UA: NEGATIVE mg/dL
Hgb urine dipstick: NEGATIVE
Ketones, ur: 5 mg/dL — AB
Leukocytes,Ua: NEGATIVE
Nitrite: NEGATIVE
Protein, ur: NEGATIVE mg/dL
Specific Gravity, Urine: 1.028 (ref 1.005–1.030)
pH: 5 (ref 5.0–8.0)

## 2021-08-03 LAB — RESP PANEL BY RT-PCR (FLU A&B, COVID) ARPGX2
Influenza A by PCR: NEGATIVE
Influenza B by PCR: NEGATIVE
SARS Coronavirus 2 by RT PCR: NEGATIVE

## 2021-08-03 LAB — TYPE AND SCREEN
ABO/RH(D): O POS
Antibody Screen: NEGATIVE

## 2021-08-03 LAB — PROTIME-INR
INR: 1.2 (ref 0.8–1.2)
Prothrombin Time: 15.6 seconds — ABNORMAL HIGH (ref 11.4–15.2)

## 2021-08-03 LAB — LIPASE, BLOOD: Lipase: 43 U/L (ref 11–51)

## 2021-08-03 LAB — HEMOGLOBIN AND HEMATOCRIT, BLOOD
HCT: 28.4 % — ABNORMAL LOW (ref 39.0–52.0)
Hemoglobin: 9.6 g/dL — ABNORMAL LOW (ref 13.0–17.0)

## 2021-08-03 LAB — APTT: aPTT: 31 seconds (ref 24–36)

## 2021-08-03 SURGERY — ESOPHAGOGASTRODUODENOSCOPY (EGD) WITH PROPOFOL
Anesthesia: Monitor Anesthesia Care

## 2021-08-03 MED ORDER — DEXMEDETOMIDINE HCL IN NACL 400 MCG/100ML IV SOLN
INTRAVENOUS | Status: DC | PRN
  Administered 2021-08-03 (×2): 8 ug via INTRAVENOUS
  Administered 2021-08-03: 4 ug via INTRAVENOUS

## 2021-08-03 MED ORDER — PANTOPRAZOLE SODIUM 40 MG IV SOLR: 40.0000 mg | Freq: Two times a day (BID) | INTRAVENOUS | Status: DC

## 2021-08-03 MED ORDER — LORAZEPAM 2 MG PO TABS: 0.0000 mg | ORAL_TABLET | Freq: Four times a day (QID) | ORAL | Status: DC

## 2021-08-03 MED ORDER — THIAMINE HCL 100 MG/ML IJ SOLN: 100.0000 mg | Freq: Every day | INTRAMUSCULAR | Status: DC

## 2021-08-03 MED ORDER — ONDANSETRON HCL 4 MG PO TABS: 4.0000 mg | ORAL_TABLET | Freq: Four times a day (QID) | ORAL | Status: DC | PRN

## 2021-08-03 MED ORDER — PROPOFOL 10 MG/ML IV BOLUS
INTRAVENOUS | Status: AC
  Filled 2021-08-03: qty 20

## 2021-08-03 MED ORDER — ONDANSETRON HCL 4 MG/2ML IJ SOLN: 4.0000 mg | Freq: Four times a day (QID) | INTRAMUSCULAR | Status: DC | PRN

## 2021-08-03 MED ORDER — PANTOPRAZOLE SODIUM 40 MG IV SOLR: 40.0000 mg | INTRAVENOUS | Status: DC

## 2021-08-03 MED ORDER — SUCCINYLCHOLINE CHLORIDE 200 MG/10ML IV SOSY
PREFILLED_SYRINGE | INTRAVENOUS | Status: AC
  Filled 2021-08-03: qty 10

## 2021-08-03 MED ORDER — LORAZEPAM 2 MG PO TABS: 0.0000 mg | ORAL_TABLET | Freq: Two times a day (BID) | ORAL | Status: DC

## 2021-08-03 MED ORDER — SODIUM CHLORIDE 0.9 % IV SOLN: INTRAVENOUS | Status: DC

## 2021-08-03 MED ORDER — POTASSIUM CHLORIDE IN NACL 40-0.9 MEQ/L-% IV SOLN
INTRAVENOUS | Status: DC
  Filled 2021-08-03 (×5): qty 1000

## 2021-08-03 MED ORDER — TIZANIDINE HCL 4 MG PO TABS
4.0000 mg | ORAL_TABLET | Freq: Three times a day (TID) | ORAL | Status: DC
  Administered 2021-08-03 – 2021-08-04 (×2): 4 mg via ORAL
  Filled 2021-08-03 (×4): qty 1

## 2021-08-03 MED ORDER — FOLIC ACID 1 MG PO TABS
1.0000 mg | ORAL_TABLET | Freq: Every day | ORAL | Status: DC
  Administered 2021-08-03 – 2021-08-04 (×2): 1 mg via ORAL
  Filled 2021-08-03 (×2): qty 1

## 2021-08-03 MED ORDER — PROPOFOL 10 MG/ML IV BOLUS
INTRAVENOUS | Status: DC | PRN
  Administered 2021-08-03: 50 mg via INTRAVENOUS
  Administered 2021-08-03: 30 mg via INTRAVENOUS
  Administered 2021-08-03: 50 mg via INTRAVENOUS
  Administered 2021-08-03: 20 mg via INTRAVENOUS
  Administered 2021-08-03: 30 mg via INTRAVENOUS
  Administered 2021-08-03: 50 mg via INTRAVENOUS
  Administered 2021-08-03: 30 mg via INTRAVENOUS

## 2021-08-03 MED ORDER — ADULT MULTIVITAMIN W/MINERALS CH
1.0000 | ORAL_TABLET | Freq: Every day | ORAL | Status: DC
  Administered 2021-08-03 – 2021-08-04 (×2): 1 via ORAL
  Filled 2021-08-03 (×2): qty 1

## 2021-08-03 MED ORDER — FENTANYL CITRATE (PF) 100 MCG/2ML IJ SOLN
INTRAMUSCULAR | Status: AC
Start: 2021-08-03 — End: ?
  Filled 2021-08-03: qty 2

## 2021-08-03 MED ORDER — HYDROCHLOROTHIAZIDE 12.5 MG PO TABS
12.5000 mg | ORAL_TABLET | Freq: Every day | ORAL | Status: DC
  Administered 2021-08-04: 12.5 mg via ORAL
  Filled 2021-08-03: qty 1

## 2021-08-03 MED ORDER — LIDOCAINE 5 % EX PTCH
1.0000 | MEDICATED_PATCH | CUTANEOUS | Status: DC
  Administered 2021-08-03: 1 via TRANSDERMAL
  Filled 2021-08-03 (×2): qty 1

## 2021-08-03 MED ORDER — PANTOPRAZOLE SODIUM 40 MG IV SOLR
40.0000 mg | Freq: Once | INTRAVENOUS | Status: AC
  Administered 2021-08-03: 40 mg via INTRAVENOUS
  Filled 2021-08-03: qty 10

## 2021-08-03 MED ORDER — METOPROLOL SUCCINATE ER 50 MG PO TB24
25.0000 mg | ORAL_TABLET | Freq: Every day | ORAL | Status: DC
  Administered 2021-08-04: 25 mg via ORAL
  Filled 2021-08-03: qty 1

## 2021-08-03 MED ORDER — LORAZEPAM 2 MG/ML IJ SOLN: 1.0000 mg | INTRAMUSCULAR | Status: DC | PRN

## 2021-08-03 MED ORDER — PANTOPRAZOLE 80MG IVPB - SIMPLE MED
80.0000 mg | Freq: Once | INTRAVENOUS | Status: DC
Start: 2021-08-03 — End: 2021-08-03
  Filled 2021-08-03: qty 100

## 2021-08-03 MED ORDER — PANTOPRAZOLE INFUSION (NEW) - SIMPLE MED
8.0000 mg/h | INTRAVENOUS | Status: DC
  Filled 2021-08-03: qty 100

## 2021-08-03 MED ORDER — FENTANYL CITRATE (PF) 100 MCG/2ML IJ SOLN
INTRAMUSCULAR | Status: DC | PRN
  Administered 2021-08-03 (×4): 25 ug via INTRAVENOUS

## 2021-08-03 MED ORDER — THIAMINE HCL 100 MG PO TABS
100.0000 mg | ORAL_TABLET | Freq: Every day | ORAL | Status: DC
  Administered 2021-08-03 – 2021-08-04 (×2): 100 mg via ORAL
  Filled 2021-08-03 (×2): qty 1

## 2021-08-03 MED ORDER — SERTRALINE HCL 50 MG PO TABS
50.0000 mg | ORAL_TABLET | Freq: Every day | ORAL | Status: DC
  Administered 2021-08-03: 50 mg via ORAL
  Filled 2021-08-03: qty 1

## 2021-08-03 MED ORDER — NORTRIPTYLINE HCL 10 MG PO CAPS
20.0000 mg | ORAL_CAPSULE | Freq: Every day | ORAL | Status: DC
  Administered 2021-08-03: 20 mg via ORAL
  Filled 2021-08-03 (×2): qty 2

## 2021-08-03 MED ORDER — LORAZEPAM 1 MG PO TABS: 1.0000 mg | ORAL_TABLET | ORAL | Status: DC | PRN

## 2021-08-03 NOTE — H&P (Signed)
History and Physical    Patient: [name] GUY:[id number] DOB: 1970/07/24 DOA: 08/03/2021 DOS: the patient was seen and examined on 08/03/2021 PCP: Pcp, No  Patient coming from: Home  Chief Complaint:  Chief Complaint  Patient presents with   Hematemesis   Rectal Bleeding   Abdominal Pain   HPI: [name] is a 51 y.o. male with medical history significant for hepatitis C, chronic right leg pain currently on NSAIDs who presents to the emergency room for evaluation of a 3-day history of coffee-ground emesis as well as passage of dark tarry stools. Patient states that he was recently started on ibuprofen 800 mg 3 times a day for pain in his right ankle related to right great toe ulcer and has been taking that with food as recommended. He states that his symptoms started 3 days prior to presentation after he had some shrimp, beans and potato salad at a seafood restaurant.  He started having coffee-ground emesis and then later that day developed melena stools.  He complains of feeling dizzy and lightheaded but denies having any falls or any loss of consciousness.  He complains of abdominal pain which she describes as a bandlike pain around the periumbilical area. He denies having any fever, no chills, no chest pain, no shortness of breath, no headache, no leg swelling, no blurred vision or any focal deficit. Patient noted to have a drop in his hemoglobin from 11.9 >> 9.8 He will be admitted to the hospital for further evaluation. Review of Systems: As mentioned in the history of present illness. All other systems reviewed and are negative. Past Medical History:  Diagnosis Date   Alcohol abuse    Anemia    a. 01/2020 s/p fall w/ significant R hip hematoma-->4 u prbcs during admission.   Delirium tremens (HCC) 01/2020   Hepatic failure (HCC)    a. 01/2020 - ETOH/sepsis/rhabdo   Polysubstance abuse (HCC)    Respiratory failure (HCC)    a. 01/2020 in setting of DTs/Sepsis req  intubation.   Rhabdomyolysis 01/2020   Sepsis (HCC) 01/2020   History reviewed. No pertinent surgical history. Social History:  reports that he has never smoked. He has never used smokeless tobacco. He reports current alcohol use. He reports that he does not currently use drugs.  No Known Allergies  No family history on file.  Prior to Admission medications   Medication Sig Start Date End Date Taking? Authorizing Provider  aspirin 81 MG chewable tablet Chew 81 mg by mouth daily.    [provider]  gentamicin cream (GARAMYCIN) 0.1 % Apply 1 application topically 2 (two) times daily. 07/10/21   Felecia Shelling, DPM  hydrochlorothiazide (HYDRODIURIL) 12.5 MG tablet Take 12.5 mg by mouth daily.    [provider]  ibuprofen (ADVIL) 800 MG tablet Take 1 tablet (800 mg total) by mouth 3 (three) times daily. 07/10/21   Felecia Shelling, DPM  lactulose [facility]) 10 GM/15ML solution SMARTSIG:Tablespoon By Mouth Daily 07/06/21   [provider]  metoprolol succinate (TOPROL-XL) 25 MG 24 hr tablet Take 25 mg by mouth daily.    [provider]  Multiple Vitamins-Minerals (MULTIVITAMIN WITH MINERALS) tablet Take 1 tablet by mouth daily.    [provider]  nortriptyline (PAMELOR) 10 MG capsule Take 20 mg by mouth at bedtime.    [provider]  pregabalin (LYRICA) 75 MG capsule Take 75 mg by mouth in the morning and at bedtime.    [provider]  sertraline (ZOLOFT) 25 MG tablet Take 50 mg by mouth at bedtime. 05/14/21   [provider]  sulfamethoxazole-trimethoprim (BACTRIM DS) 800-160 MG tablet Take 1 tablet by mouth 2 (two) times daily. 07/10/21   Felecia ShellingEvans, Brent M, DPM  tiZANidine (ZANAFLEX) 4 MG tablet Take 4 mg by mouth 3 (three) times daily.    [provider]    Physical Exam: Vitals:   08/03/21 1251 08/03/21 1301 08/03/21 1500  BP: (!) 168/93  (!) 149/80  Pulse: 85  77  Resp: 18  18  Temp: 98.6 F (37 C)     TempSrc: Oral    SpO2: 97%  99%  Weight:  90.7 kg   Height:  5\' 8"  (1.727 m)    Physical Exam Vitals and nursing note reviewed.  Constitutional:      Appearance: He is well-developed.  HENT:     Head: Normocephalic and atraumatic.     Mouth/Throat:     Mouth: Mucous membranes are moist.  Eyes:     Pupils: Pupils are equal, round, and reactive to light.  Cardiovascular:     Rate and Rhythm: Normal rate and regular rhythm.  Pulmonary:     Effort: Pulmonary effort is normal.     Breath sounds: Normal breath sounds.  Abdominal:     General: Abdomen is flat. Bowel sounds are normal.     Palpations: Abdomen is soft.     Tenderness: There is abdominal tenderness in the epigastric area.  Skin:    General: Skin is warm and dry.  Neurological:     General: No focal deficit present.     Mental Status: He is alert.  Psychiatric:        Mood and Affect: Mood normal.        Behavior: Behavior normal.    Data Reviewed: Relevant notes from primary care and specialist visits, past discharge summaries as available in EHR, including Care Everywhere. Prior diagnostic testing as pertinent to current admission diagnoses Updated medications and problem lists for reconciliation ED course, including vitals, labs, imaging, treatment and response to treatment Triage notes, nursing and pharmacy notes and ED provider's notes Notable results as noted in HPI Lipase 43, sodium 136, potassium 3.4, glucose 117, BUN 22, creatinine 0.90, white count 12.7, hemoglobin 9.8, hematocrit 29.5, AST 89, ALT 53 Twelve-lead EKG shows normal sinus rhythm with a prolonged QT There are no new results to review at this time.  Assessment and Plan: * GI bleed Patient presents to the ER for evaluation of coffee-ground emesis as well as passage of melena stools. Concern for possible NSAID induced peptic ulcer disease versus acute gastritis He also has a history of alcoholic liver cirrhosis, no documented history of  varices We will place patient on a Protonix drip Check serial H&H Consult gastroenterology  Skin ulcer of foot including toes with fat layer exposed (HCC) Follow-up with podiatry as an outpatient  Depression Continue sertraline  Hepatic cirrhosis due to chronic hepatitis C infection (HCC) Patient has completed antiviral for hepatitis C  Alcoholic cirrhosis of liver (HCC) Patient has a history of alcoholic liver cirrhosis as well as hepatitis C that has been treated He does have transaminitis concerning for continued alcohol use even though he states that he has stopped drinking alcohol He will need to be monitored closely for signs and symptoms of alcohol withdrawal We will place patient on CIWA protocol and administer lorazepam for CIWA score of 8 or greater  PAF (paroxysmal atrial fibrillation) (HCC) History  of paroxysmal atrial fibrillation not on anticoagulation due to low CHA2DS2-VASc score  Hypokalemia Related to GI losses from coffee-ground emesis and melena stools Supplement potassium Check magnesium levels  Acute blood loss anemia Secondary to GI bleed At baseline patient has a hemoglobin of 11.9g/dl On admission his hemoglobin is 9.8g/dl Check serial H&H We will transfuse for hemoglobin less than 7g/dl       Advance Care Planning:   Code Status: Full Code   Consults: Gastroenterology  Family Communication: Greater than 50% of time was spent discussing patient's condition and plan of care with him at the bedside.  All questions and concerns have been addressed.  He verbalizes understanding and agrees with the plan.  Severity of Illness: The appropriate patient status for this patient is INPATIENT. Inpatient status is judged to be reasonable and necessary in order to provide the required intensity of service to ensure the patient's safety. The patient's presenting symptoms, physical exam findings, and initial radiographic and laboratory data in the context of  their chronic comorbidities is felt to place them at high risk for further clinical deterioration. Furthermore, it is not anticipated that the patient will be medically stable for discharge from the hospital within 2 midnights of admission.   * I certify that at the point of admission it is my clinical judgment that the patient will require inpatient hospital care spanning beyond 2 midnights from the point of admission due to high intensity of service, high risk for further deterioration and high frequency of surveillance required.*  Author: Lucile Shutters, MD 08/03/2021 4:02 PM  For on call review [email or web address].

## 2021-08-03 NOTE — ED Provider Notes (Signed)
? ?[facility] ?Provider Note ? ? ? Event Date/Time  ? First MD Initiated Contact with Patient 08/03/21 1453   ?  (approximate) ? ? ?History  ? ?Hematemesis, Rectal Bleeding, and Abdominal Pain ? ? ?HPI ? ?Braidyn Jerrian Mells is a 51 y.o. male with history of alcoholism, hep C who presents with complaints of coffee-ground emesis and black stools.  Patient reports his vomit looked like coffee grounds 2 days ago, his stool turned black today as well.  He reports this is happened in the past and required transfusions and admission for ulcer.  He was treated for hep C at [facility].  Medical records reviewed, I do not see any record of varices.  Patient does describe epigastric discomfort ?  ? ? ?Physical Exam  ? ?Triage Vital Signs: ?ED Triage Vitals  ?Enc Vitals Group  ?   BP 08/03/21 1251 (!) 168/93  ?   Pulse Rate 08/03/21 1251 85  ?   Resp 08/03/21 1251 18  ?   Temp 08/03/21 1251 98.6 ?F (37 ?C)  ?   Temp Source 08/03/21 1251 Oral  ?   SpO2 08/03/21 1251 97 %  ?   Weight 08/03/21 1301 90.7 kg (200 lb)  ?   Height 08/03/21 1301 1.727 m (5\' 8" )  ?   Head Circumference --   ?   Peak Flow --   ?   Pain Score 08/03/21 1300 7  ?   Pain Loc --   ?   Pain Edu? --   ?   Excl. in GC? --   ? ? ?Most recent vital signs: ?Vitals:  ? 08/03/21 1251  ?BP: (!) 168/93  ?Pulse: 85  ?Resp: 18  ?Temp: 98.6 ?F (37 ?C)  ?SpO2: 97%  ? ? ? ?General: Awake, no distress.  ?CV:  Good peripheral perfusion.  ?Resp:  Normal effort.  ?Abd:  No distention.  ?Other:   ? ? ?ED Results / Procedures / Treatments  ? ?Labs ?(all labs ordered are listed, but only abnormal results are displayed) ?Labs Reviewed  ?COMPREHENSIVE METABOLIC PANEL - Abnormal; Notable for the following components:  ?    Result Value  ? Potassium 3.4 (*)   ? Glucose, Bld 117 (*)   ? BUN 22 (*)   ? AST 89 (*)   ? ALT 53 (*)   ? Total Bilirubin 1.5 (*)   ? All other components within normal limits  ?CBC - Abnormal; Notable for the following components:  ? WBC  12.0 (*)   ? RBC 3.01 (*)   ? Hemoglobin 9.8 (*)   ? HCT 29.5 (*)   ? All other components within normal limits  ?URINALYSIS, ROUTINE W REFLEX MICROSCOPIC - Abnormal; Notable for the following components:  ? Color, Urine YELLOW (*)   ? APPearance HAZY (*)   ? Ketones, ur 5 (*)   ? All other components within normal limits  ?RESP PANEL BY RT-PCR (FLU A&B, COVID) ARPGX2  ?LIPASE, BLOOD  ?APTT  ?PROTIME-INR  ?TYPE AND SCREEN  ? ? ? ?EKG ? ?ED ECG REPORT ?I, 08/05/21, the attending physician, personally viewed and interpreted this ECG. ? ?Date: 08/03/2021 ? ?Rhythm: normal sinus rhythm ?QRS Axis: normal ?Intervals: Abnormal ?ST/T Wave abnormalities: normal ?Narrative Interpretation: no evidence of acute ischemia ? ? ? ?RADIOLOGY ? ? ? ? ?PROCEDURES: ? ?Critical Care performed: yes ? ?CRITICAL CARE ?Performed by: 08/05/2021 ? ? ?Total critical care time: 30 minutes ? ?Critical care  time was exclusive of separately billable procedures and treating other patients. ? ?Critical care was necessary to treat or prevent imminent or life-threatening deterioration. ? ?Critical care was time spent personally by me on the following activities: development of treatment plan with patient and/or surrogate as well as nursing, discussions with consultants, evaluation of patient's response to treatment, examination of patient, obtaining history from patient or surrogate, ordering and performing treatments and interventions, ordering and review of laboratory studies, ordering and review of radiographic studies, pulse oximetry and re-evaluation of patient's condition. ? ? ?Procedures ? ? ?MEDICATIONS ORDERED IN ED: ?Medications  ?pantoprazole (PROTONIX) injection 40 mg (has no administration in time range)  ? ? ? ?IMPRESSION / MDM / ASSESSMENT AND PLAN / ED COURSE  ?I reviewed the triage vital signs and the nursing notes. ? ?Patient presents with epigastric abdominal pain, coffee-ground emesis and melena.  His vital signs and exam  are overall reassuring, mild tenderness to palpation of the epigastrium. ? ? ?Hemoglobin of 9.8 which is dropped about 2 g from his baseline ? ?No record of varices on EMR although he does have a history of alcoholism and hep C. ? ?We will treat with IV Protonix, IV antiemetics as needed ? ?We will type and screen ? ?Have discussed with the hospitalist for admission ? ? ?  ? ? ?FINAL CLINICAL IMPRESSION(S) / ED DIAGNOSES  ? ?Final diagnoses:  ?Acute GI bleeding  ? ? ? ?Rx / DC Orders  ? ?ED Discharge Orders   ? ? None  ? ?  ? ? ? ?Note:  This document was prepared using Dragon voice recognition software and may include unintentional dictation errors. ?  ?Jene Every, MD ?08/03/21 1454 ? ?
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
## 2021-08-03 NOTE — Op Note (Signed)
[facility] ?Gastroenterology ?Patient Name: [name] ?Procedure Date: 08/03/2021 4:18 PM ?MRN: [id number] ?Account #: [id number] ?Date of Birth: 1971-02-15 ?Admit Type: Inpatient ?Age: 51 ?Room: [facility] ENDO ROOM 2 ?Gender: Male ?Note Status: Finalized ?Instrument Name: Upper Endoscope [id number] ?Procedure:             Upper GI endoscopy ?Indications:           Coffee-ground emesis, Melena ?Providers:             Andrey Farmer MD, MD ?Referring MD:          Forest Gleason Md, MD (Referring MD) ?Medicines:             Monitored Anesthesia Care ?Complications:         No immediate complications. Estimated blood loss:  ?                       Minimal. ?Procedure:             Pre-Anesthesia Assessment: ?                       - Prior to the procedure, a History and Physical was  ?                       performed, and patient medications and allergies were  ?                       reviewed. The patient is competent. The risks and  ?                       benefits of the procedure and the sedation options and  ?                       risks were discussed with the patient. All questions  ?                       were answered and informed consent was obtained.  ?                       Patient identification and proposed procedure were  ?                       verified by the physician, the nurse, the  ?                       anesthesiologist, the anesthetist and the technician  ?                       in the endoscopy suite. Mental Status Examination:  ?                       alert and oriented. Airway Examination: normal  ?                       oropharyngeal airway and neck mobility. Respiratory  ?                       Examination: clear to auscultation. CV Examination:  ?  normal. Prophylactic Antibiotics: The patient does not  ?                       require prophylactic antibiotics. Prior  ?                       Anticoagulants: The patient has taken no previous  ?                        anticoagulant or antiplatelet agents except for  ?                       aspirin. ASA Grade Assessment: III - A patient with  ?                       severe systemic disease. After reviewing the risks and  ?                       benefits, the patient was deemed in satisfactory  ?                       condition to undergo the procedure. The anesthesia  ?                       plan was to use monitored anesthesia care (MAC).  ?                       Immediately prior to administration of medications,  ?                       the patient was re-assessed for adequacy to receive  ?                       sedatives. The heart rate, respiratory rate, oxygen  ?                       saturations, blood pressure, adequacy of pulmonary  ?                       ventilation, and response to care were monitored  ?                       throughout the procedure. The physical status of the  ?                       patient was re-assessed after the procedure. ?                       After obtaining informed consent, the endoscope was  ?                       passed under direct vision. Throughout the procedure,  ?                       the patient's blood pressure, pulse, and oxygen  ?                       saturations were monitored continuously. The Endoscope  ?  was introduced through the mouth, and advanced to the  ?                       second part of duodenum. The upper GI endoscopy was  ?                       accomplished without difficulty. The patient tolerated  ?                       the procedure well. ?Findings: ?     The examined esophagus was normal. ?     Patchy mild inflammation characterized by erosions and erythema was  ?     found in the gastric antrum. Biopsies were taken with a cold forceps for  ?     Helicobacter pylori testing. Estimated blood loss was minimal. ?     One non-bleeding cratered duodenal ulcer with a clean ulcer base  ?     (Forrest Class III) was found in the first  portion of the duodenum. The  ?     lesion was 5 mm in largest dimension. ?     The exam of the duodenum was otherwise normal. ?Impression:            - Normal esophagus. ?                       - Gastritis. Biopsied. ?                       - Non-bleeding duodenal ulcer with a clean ulcer base  ?                       (Forrest Class III). ?Recommendation:        - Return patient to hospital ward for ongoing care. ?                       - Use a proton pump inhibitor PO daily for 3 months. ?                       - No ibuprofen, naproxen, or other non-steroidal  ?                       anti-inflammatory drugs. ?                       - Await pathology results. ?Procedure Code(s):     --- Professional --- ?                       [phone], Esophagogastroduodenoscopy, flexible,  ?                       transoral; with biopsy, single or multiple ?Diagnosis Code(s):     --- Professional --- ?                       K29.70, Gastritis, unspecified, without bleeding ?                       K26.9, Duodenal ulcer, unspecified as acute or  ?  chronic, without hemorrhage or perforation ?                       K92.0, Hematemesis ?                       K92.1, Melena (includes Hematochezia) ?CPT copyright 2019 [employer]. All rights reserved. ?The codes documented in this report are preliminary and upon coder review may  ?be revised to meet current compliance requirements. ?Andrey Farmer MD, MD ?08/03/2021 4:41:59 PM ?Number of Addenda: 0 ?Note Initiated On: 08/03/2021 4:18 PM ?Estimated Blood Loss:  Estimated blood loss was minimal. ?     [facility] ?

## 2021-08-03 NOTE — Assessment & Plan Note (Signed)
Follow-up with podiatry as an outpatient ?

## 2021-08-03 NOTE — ED Notes (Signed)
Report to OR RN.

## 2021-08-03 NOTE — ED Notes (Signed)
Report to Asher Muir, RN at 2A ?

## 2021-08-03 NOTE — Assessment & Plan Note (Signed)
Patient presents to the ER for evaluation of coffee-ground emesis as well as passage of melena stools. Concern for possible NSAID induced peptic ulcer disease versus acute gastritis He also has a history of alcoholic liver cirrhosis, no documented history of varices We will place patient on a Protonix drip Check serial H&H Consult gastroenterology
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
## 2021-08-03 NOTE — Assessment & Plan Note (Signed)
Related to GI losses from coffee-ground emesis and melena stools Supplement potassium Check magnesium levels

## 2021-08-03 NOTE — Transfer of Care (Signed)
Immediate Anesthesia Transfer of Care Note ? ?Patient: [name] ? ?Procedure(s) Performed: ESOPHAGOGASTRODUODENOSCOPY (EGD) WITH PROPOFOL ? ?Patient Location: PACU and Endoscopy Unit ? ?Anesthesia Type:General ? ?Level of Consciousness: drowsy and patient cooperative ? ?Airway & Oxygen Therapy: Patient Spontanous Breathing ? ?Post-op Assessment: Report given to RN and Post -op Vital signs reviewed and stable ? ?Post vital signs: Reviewed and stable ? ?Last Vitals:  ?Vitals Value Taken Time  ?BP 109/74 08/03/21 1643  ?Temp 36.1 ?C 08/03/21 1640  ?Pulse 89 08/03/21 1640  ?Resp 20 08/03/21 1643  ?SpO2 96 % 08/03/21 1640  ? ? ?Last Pain:  ?Vitals:  ? 08/03/21 1640  ?TempSrc: Temporal  ?PainSc: 5   ?   ? ?  ? ?Complications: No notable events documented. ?

## 2021-08-03 NOTE — Assessment & Plan Note (Signed)
Secondary to GI bleed At baseline patient has a hemoglobin of 11.9g/dl On admission his hemoglobin is 9.8g/dl Check serial H&H We will transfuse for hemoglobin less than 7g/dl

## 2021-08-03 NOTE — Care Plan (Signed)
EGD showed cratered duodenal ulcer with clean base. No intervention required. This is likely due to NSAIDS he started recently. Recommend PPI daily for at least 3 months and NSAID avoidance. ? ?- advance diet as tolerated ?- PPI PO for at least three months ?- avoid NSAIDS ?- f/u path results ?- can be discharged from a GI perspective ?- will follow peripherally, please call with any questions or concerns. ? ?Merlyn Lot MD, MPH ?[facility] ?

## 2021-08-03 NOTE — ED Triage Notes (Signed)
Pt to ED via POV with c/o coffee ground emesis and he states that he is also having black tarry stools. His color of skin is WNL. He is also having epigastric pain this all began a few days ago. ?

## 2021-08-03 NOTE — Assessment & Plan Note (Signed)
History of paroxysmal atrial fibrillation not on anticoagulation due to low CHA2DS2-VASc score ?

## 2021-08-03 NOTE — Assessment & Plan Note (Signed)
Patient has completed antiviral for hepatitis C ?

## 2021-08-03 NOTE — Assessment & Plan Note (Signed)
Patient has a history of alcoholic liver cirrhosis as well as hepatitis C that has been treated He does have transaminitis concerning for continued alcohol use even though he states that he has stopped drinking alcohol He will need to be monitored closely for signs and symptoms of alcohol withdrawal We will place patient on CIWA protocol and administer lorazepam for CIWA score of 8 or greater

## 2021-08-03 NOTE — Assessment & Plan Note (Signed)
Continue sertraline 

## 2021-08-04 DIAGNOSIS — K922 Gastrointestinal hemorrhage, unspecified: Secondary | ICD-10-CM | POA: Diagnosis not present

## 2021-08-04 DIAGNOSIS — R69 Illness, unspecified: Secondary | ICD-10-CM | POA: Diagnosis not present

## 2021-08-04 DIAGNOSIS — K7030 Alcoholic cirrhosis of liver without ascites: Secondary | ICD-10-CM | POA: Diagnosis not present

## 2021-08-04 DIAGNOSIS — D62 Acute posthemorrhagic anemia: Secondary | ICD-10-CM | POA: Diagnosis not present

## 2021-08-04 LAB — IRON AND TIBC
Iron: 43 ug/dL — ABNORMAL LOW (ref 45–182)
Saturation Ratios: 13 % — ABNORMAL LOW (ref 17.9–39.5)
TIBC: 329 ug/dL (ref 250–450)
UIBC: 286 ug/dL

## 2021-08-04 LAB — BASIC METABOLIC PANEL
Anion gap: 4 — ABNORMAL LOW (ref 5–15)
BUN: 17 mg/dL (ref 6–20)
CO2: 25 mmol/L (ref 22–32)
Calcium: 8.3 mg/dL — ABNORMAL LOW (ref 8.9–10.3)
Chloride: 109 mmol/L (ref 98–111)
Creatinine, Ser: 0.72 mg/dL (ref 0.61–1.24)
GFR, Estimated: >60 mL/min (ref >60)
Glucose, Bld: 99 mg/dL (ref 70–99)
Potassium: 4.1 mmol/L (ref 3.5–5.1)
Sodium: 138 mmol/L (ref 135–145)

## 2021-08-04 LAB — HEMOGLOBIN AND HEMATOCRIT, BLOOD
HCT: 25.2 % — ABNORMAL LOW (ref 39.0–52.0)
Hemoglobin: 8.5 g/dL — ABNORMAL LOW (ref 13.0–17.0)

## 2021-08-04 LAB — CBC
HCT: 23.3 % — ABNORMAL LOW (ref 39.0–52.0)
Hemoglobin: 7.8 g/dL — ABNORMAL LOW (ref 13.0–17.0)
MCH: 32.9 pg (ref 26.0–34.0)
MCHC: 33.5 g/dL (ref 30.0–36.0)
MCV: 98.3 fL (ref 80.0–100.0)
Platelets: 162 10*3/uL (ref 150–400)
RBC: 2.37 MIL/uL — ABNORMAL LOW (ref 4.22–5.81)
RDW: 13.2 % (ref 11.5–15.5)
WBC: 7.6 10*3/uL (ref 4.0–10.5)
nRBC: 0 % (ref 0.0–0.2)

## 2021-08-04 LAB — FERRITIN: Ferritin: 285 ng/mL (ref 24–336)

## 2021-08-04 LAB — VITAMIN B12: Vitamin B-12: 490 pg/mL (ref 180–914)

## 2021-08-04 MED ORDER — PANTOPRAZOLE SODIUM 40 MG PO TBEC: 40.0000 mg | DELAYED_RELEASE_TABLET | Freq: Two times a day (BID) | ORAL | 1 refills | Status: DC

## 2021-08-04 MED ORDER — SODIUM CHLORIDE 0.9 % IV SOLN
250.0000 mg | Freq: Once | INTRAVENOUS | Status: DC
  Filled 2021-08-04: qty 20

## 2021-08-04 MED ORDER — FERROUS SULFATE 325 (65 FE) MG PO TABS: 325.0000 mg | ORAL_TABLET | Freq: Every day | ORAL | 0 refills | Status: DC

## 2021-08-04 MED ORDER — FERROUS SULFATE 325 (65 FE) MG PO TABS: 325.0000 mg | ORAL_TABLET | Freq: Every day | ORAL | Status: DC

## 2021-08-04 NOTE — Hospital Course (Addendum)
[name] is a 51 y.o. male with medical history significant for hepatitis C, chronic right leg pain currently on NSAIDs who presents to the emergency room for evaluation of a 3-day history of coffee-ground emesis as well as passage of dark tarry stools. ?Patient was seen by GI, EGD showed gastritis.  Started on Protonix.  Hemoglobin has been stable, medically stable to be discharged.  Patient is advised to follow-up with PCP and gastroenterology in the near future. ?

## 2021-08-04 NOTE — Progress Notes (Signed)
Discharge instructions explained to pt/ verbalized an understanding/ iv and tele removed/ will transport off unit via wheelchair.  

## 2021-08-04 NOTE — Plan of Care (Signed)

## 2021-08-04 NOTE — Discharge Summary (Signed)
Physician Discharge Summary   Patient: [name] MRN: [id number] DOB: 10/14/1970  Admit date:     08/03/2021  Discharge date: 08/04/21  Discharge Physician: Marrion Coy   PCP: Pcp, No   Recommendations at discharge:   Please follow-up on the B12 level at the next office visit. Follow-up with GI in a month. Follow-up with PCP in 1 week.  Discharge Diagnoses: Principal Problem:   GI bleed Active Problems:   Skin ulcer of foot including toes with fat layer exposed (HCC)   Acute blood loss anemia   Hypokalemia   PAF (paroxysmal atrial fibrillation) (HCC)   Alcoholic cirrhosis of liver (HCC)   Hepatic cirrhosis due to chronic hepatitis C infection (HCC)   Depression  Resolved Problems:   * No resolved hospital problems. *  Hospital Course: Avelardo Killmer is a 51 y.o. male with medical history significant for hepatitis C, chronic right leg pain currently on NSAIDs who presents to the emergency room for evaluation of a 3-day history of coffee-ground emesis as well as passage of dark tarry stools. Patient was seen by GI, EGD showed gastritis.  Started on Protonix.  Hemoglobin has been stable, medically stable to be discharged.  Patient is advised to follow-up with PCP and gastroenterology in the near future.  Assessment and Plan: * GI bleed Secondary to gastritis. Condition had improved, no additional bleeding.  Medically stable to be discharged.  Skin ulcer of foot including toes with fat layer exposed (HCC) Follow-up with podiatry as an outpatient  Depression Continue sertraline  Hepatic cirrhosis due to chronic hepatitis C infection (HCC) Patient has completed antiviral for hepatitis C  Alcoholic cirrhosis of liver (HCC) No evidence of alcohol withdrawal.  PAF (paroxysmal atrial fibrillation) (HCC) History of paroxysmal atrial fibrillation not on anticoagulation due to low CHA2DS2-VASc score  Hypokalemia Potassium has normalized.  Acute blood loss  anemia Hemoglobin has been stabilized.  No additional bleeding.  Patient has a mild iron deficiency, will start oral iron.  B12 level still pending, patient prefer not to wait in the hospital for results.  Follow-up with PCP and GI in the near future          Consultants: GI Procedures performed: EGD  Disposition: Home Diet recommendation:  Discharge Diet Orders (From admission, onward)     Start     Ordered   08/04/21 0000  Diet - low sodium heart healthy        08/04/21 1008           Cardiac diet DISCHARGE MEDICATION: Allergies as of 08/04/2021   No Known Allergies      Medication List     STOP taking these medications    ibuprofen 800 MG tablet Commonly known as: ADVIL   pregabalin 75 MG capsule Commonly known as: LYRICA   sulfamethoxazole-trimethoprim 800-160 MG tablet Commonly known as: BACTRIM DS       TAKE these medications    aspirin 81 MG chewable tablet Chew 81 mg by mouth daily.   ferrous sulfate 325 (65 FE) MG tablet Take 1 tablet (325 mg total) by mouth daily with breakfast. Start taking on: August 05, 2021   gentamicin cream 0.1 % Commonly known as: GARAMYCIN Apply 1 application topically 2 (two) times daily.   hydrochlorothiazide 12.5 MG tablet Commonly known as: HYDRODIURIL Take 12.5 mg by mouth daily.   lactulose 10 GM/15ML solution Commonly known as: CHRONULAC SMARTSIG:Tablespoon By Mouth Daily   metoprolol succinate 25 MG 24 hr tablet Commonly  known as: TOPROL-XL Take 25 mg by mouth daily.   multivitamin with minerals tablet Take 1 tablet by mouth daily.   nortriptyline 10 MG capsule Commonly known as: PAMELOR Take 20 mg by mouth at bedtime.   pantoprazole 40 MG tablet Commonly known as: Protonix Take 1 tablet (40 mg total) by mouth 2 (two) times daily before a meal.   sertraline 25 MG tablet Commonly known as: ZOLOFT Take 50 mg by mouth at bedtime.   tiZANidine 4 MG tablet Commonly known as: ZANAFLEX Take 4  mg by mouth 3 (three) times daily.        Follow-up Information     Antonieta Iba, MD Follow up in 3 week(s).   Specialty: Cardiology Contact information: [address] [place] [address] [phone]         Regis Bill, MD Follow up in 1 month(s).   Specialty: Gastroenterology Contact information: [address] [place] [address] [phone]                Discharge Exam: Ceasar Mons Weights   08/03/21 1301 08/03/21 1614  Weight: 90.7 kg 88.5 kg   General exam: Appears calm and comfortable  Respiratory system: Clear to auscultation. Respiratory effort normal. Cardiovascular system: S1 & S2 heard, RRR. No JVD, murmurs, rubs, gallops or clicks. No pedal edema. Gastrointestinal system: Abdomen is nondistended, soft and nontender. No organomegaly or masses felt. Normal bowel sounds heard. Central nervous system: Alert and oriented. No focal neurological deficits. Extremities: Symmetric 5 x 5 power. Skin: No rashes, lesions or ulcers Psychiatry: Judgement and insight appear normal. Mood & affect appropriate.    Condition at discharge: good  The results of significant diagnostics from this hospitalization (including imaging, microbiology, ancillary and laboratory) are listed below for reference.   Imaging Studies: No results found.  Microbiology: Results for orders placed or performed during the hospital encounter of 08/03/21  Resp Panel by RT-PCR (Flu A&B, Covid) Nasopharyngeal Swab     Status: None   Collection Time: 08/03/21  2:36 PM   Specimen: Nasopharyngeal Swab; Nasopharyngeal(NP) swabs in vial transport medium  Result Value Ref Range Status   SARS Coronavirus 2 by RT PCR NEGATIVE NEGATIVE Final    Comment: (NOTE) SARS-CoV-2 target nucleic acids are NOT DETECTED.  The SARS-CoV-2 RNA is generally detectable in upper respiratory specimens during the acute phase of infection. The lowest concentration of SARS-CoV-2  viral copies this assay can detect is 138 copies/mL. A negative result does not preclude SARS-Cov-2 infection and should not be used as the sole basis for treatment or other patient management decisions. A negative result may occur with  improper specimen collection/handling, submission of specimen other than nasopharyngeal swab, presence of viral mutation(s) within the areas targeted by this assay, and inadequate number of viral copies(<138 copies/mL). A negative result must be combined with clinical observations, patient history, and epidemiological information. The expected result is Negative.  Fact Sheet for Patients:  BloggerCourse.com  Fact Sheet for Healthcare Providers:  SeriousBroker.it  This test is no t yet approved or cleared by the [place] FDA and  has been authorized for detection and/or diagnosis of SARS-CoV-2 by FDA under an Emergency Use Authorization (EUA). This EUA will remain  in effect (meaning this test can be used) for the duration of the COVID-19 declaration under Section 564(b)(1) of the Act, 21 U.S.C.section 360bbb-3(b)(1), unless the authorization is terminated  or revoked sooner.       Influenza A  by PCR NEGATIVE NEGATIVE Final   Influenza B by PCR NEGATIVE NEGATIVE Final    Comment: (NOTE) The Xpert Xpress SARS-CoV-2/FLU/RSV plus assay is intended as an aid in the diagnosis of influenza from Nasopharyngeal swab specimens and should not be used as a sole basis for treatment. Nasal washings and aspirates are unacceptable for Xpert Xpress SARS-CoV-2/FLU/RSV testing.  Fact Sheet for Patients: BloggerCourse.com  Fact Sheet for Healthcare Providers: SeriousBroker.it  This test is not yet approved or cleared by the [place] FDA and has been authorized for detection and/or diagnosis of SARS-CoV-2 by FDA under an Emergency Use Authorization  (EUA). This EUA will remain in effect (meaning this test can be used) for the duration of the COVID-19 declaration under Section 564(b)(1) of the Act, 21 U.S.C. section 360bbb-3(b)(1), unless the authorization is terminated or revoked.  Performed at [facility], [address] [place], [place] [address]     Labs: CBC: Recent Labs  Lab 08/03/21 1253 08/03/21 1743 08/03/21 2357 08/04/21 0609  WBC 12.0*  --  7.6  --   HGB 9.8* 9.6* 7.8* 8.5*  HCT 29.5* 28.4* 23.3* 25.2*  MCV 98.0  --  98.3  --   PLT 257  --  162  --    Basic Metabolic Panel: Recent Labs  Lab 08/03/21 1253 08/04/21 0609  NA 136 138  K 3.4* 4.1  CL 103 109  CO2 25 25  GLUCOSE 117* 99  BUN 22* 17  CREATININE 0.92 0.72  CALCIUM 8.9 8.3*   Liver Function Tests: Recent Labs  Lab 08/03/21 1253  AST 89*  ALT 53*  ALKPHOS 85  BILITOT 1.5*  PROT 7.5  ALBUMIN 4.3   CBG: No results for input(s): GLUCAP in the last 168 hours.  Discharge time spent: greater than 30 minutes.  Signed: Marrion Coy, MD [facility] 08/04/2021

## 2021-08-05 DIAGNOSIS — M5416 Radiculopathy, lumbar region: Secondary | ICD-10-CM | POA: Diagnosis not present

## 2021-08-05 DIAGNOSIS — I10 Essential (primary) hypertension: Secondary | ICD-10-CM | POA: Diagnosis not present

## 2021-08-05 LAB — SURGICAL PATHOLOGY

## 2021-08-05 NOTE — Anesthesia Postprocedure Evaluation (Signed)
Anesthesia Post Note ? ?Patient: [name] ? ?Procedure(s) Performed: ESOPHAGOGASTRODUODENOSCOPY (EGD) WITH PROPOFOL ? ?Patient location during evaluation: PACU ?Anesthesia Type: General ?Level of consciousness: awake and alert ?Pain management: pain level controlled ?Vital Signs Assessment: post-procedure vital signs reviewed and stable ?Respiratory status: spontaneous breathing, nonlabored ventilation, respiratory function stable and patient connected to nasal cannula oxygen ?Cardiovascular status: blood pressure returned to baseline and stable ?Postop Assessment: no apparent nausea or vomiting ?Anesthetic complications: no ? ? ?No notable events documented. ? ? ?Last Vitals:  ?Vitals:  ? 08/04/21 0408 08/04/21 0838  ?BP: 101/63 (!) 144/79  ?Pulse: 67 71  ?Resp: 15 16  ?Temp: 37.1 ?C 36.8 ?C  ?SpO2: 99% 97%  ?  ?Last Pain:  ?Vitals:  ? 08/03/21 2310  ?TempSrc:   ?PainSc: 0-No pain  ? ? ?  ?  ?  ?  ?  ?  ? ?Molli Barrows ? ? ? ? ?

## 2021-08-05 NOTE — Anesthesia Preprocedure Evaluation (Signed)
Anesthesia Evaluation  ?Patient identified by MRN, date of birth, ID band ?Patient awake ? ? ? ?Reviewed: ?Allergy & Precautions, H&P , NPO status , Patient's Chart, lab work & pertinent test results, reviewed documented beta blocker date and time  ? ?Airway ?Mallampati: II ? ? ?Neck ROM: full ? ? ? Dental ? ?(+) Poor Dentition ?  ?Pulmonary ?neg pulmonary ROS,  ?  ?Pulmonary exam normal ? ? ? ? ? ? ? Cardiovascular ?Exercise Tolerance: Good ?negative cardio ROS ?Normal cardiovascular exam ?Rhythm:regular Rate:Normal ? ? ?  ?Neuro/Psych ?PSYCHIATRIC DISORDERS Depression  Neuromuscular disease   ? GI/Hepatic ?negative GI ROS, Neg liver ROS,   ?Endo/Other  ?negative endocrine ROS ? Renal/GU ?negative Renal ROS  ?negative genitourinary ?  ?Musculoskeletal ? ? Abdominal ?  ?Peds ? Hematology ? ?(+) Blood dyscrasia, anemia ,   ?Anesthesia Other Findings ?Past Medical History: ?No date: Alcohol abuse ?No date: Anemia ?    Comment:  a. 01/2020 s/p fall w/ significant R hip hematoma-->4 u  ?             prbcs during admission. ?01/2020: Delirium tremens (HCC) ?No date: Hepatic failure (HCC) ?    Comment:  a. 01/2020 - ETOH/sepsis/rhabdo ?No date: Polysubstance abuse (HCC) ?No date: Respiratory failure (HCC) ?    Comment:  a. 01/2020 in setting of DTs/Sepsis req intubation. ?01/2020: Rhabdomyolysis ?01/2020: Sepsis (HCC) ?Past Surgical History: ?08/03/2021: ESOPHAGOGASTRODUODENOSCOPY (EGD) WITH PROPOFOL; N/A ?    Comment:  Procedure: ESOPHAGOGASTRODUODENOSCOPY (EGD) WITH  ?             PROPOFOL;  Surgeon: Regis Bill, MD;  Location:  ?             [facility];  Service: Endoscopy;  Laterality: N/A; ?BMI   ? Body Mass Index: 29.65 kg/m?  ?  ? Reproductive/Obstetrics ?negative OB ROS ? ?  ? ? ? ? ? ? ? ? ? ? ? ? ? ?  ?  ? ? ? ? ? ? ? ? ?Anesthesia Physical ?Anesthesia Plan ? ?ASA: 3 ? ?Anesthesia Plan: General  ? ?Post-op Pain Management:   ? ?Induction:  ? ?PONV Risk Score and Plan:   ? ?Airway Management Planned:  ? ?Additional Equipment:  ? ?Intra-op Plan:  ? ?Post-operative Plan:  ? ?Informed Consent: I have reviewed the patients History and Physical, chart, labs and discussed the procedure including the risks, benefits and alternatives for the proposed anesthesia with the patient or authorized representative who has indicated his/her understanding and acceptance.  ? ? ? ?Dental Advisory Given ? ?Plan Discussed with: CRNA ? ?Anesthesia Plan Comments:   ? ? ? ? ? ? ?Anesthesia Quick Evaluation ? ?

## 2021-08-14 ENCOUNTER — Ambulatory Visit: Payer: 59 | Admitting: Podiatry

## 2021-08-14 ENCOUNTER — Other Ambulatory Visit: Payer: Self-pay

## 2021-08-14 DIAGNOSIS — L97512 Non-pressure chronic ulcer of other part of right foot with fat layer exposed: Secondary | ICD-10-CM

## 2021-08-17 DIAGNOSIS — M48061 Spinal stenosis, lumbar region without neurogenic claudication: Secondary | ICD-10-CM | POA: Diagnosis not present

## 2021-08-17 DIAGNOSIS — G5701 Lesion of sciatic nerve, right lower limb: Secondary | ICD-10-CM | POA: Diagnosis not present

## 2021-08-17 DIAGNOSIS — M5416 Radiculopathy, lumbar region: Secondary | ICD-10-CM | POA: Diagnosis not present

## 2021-08-17 DIAGNOSIS — M5136 Other intervertebral disc degeneration, lumbar region: Secondary | ICD-10-CM | POA: Diagnosis not present

## 2021-08-23 NOTE — Progress Notes (Signed)
? ?  HPI: 51 y.o. male presenting today for follow-up evaluation of an ulcer to the right great toe.  Patient doing well.  He continues to wear the cam boot is much as possible.  He continues to take ibuprofen 800 mg for the pain.  Overall improvement.  He has been applying the antibiotic cream as instructed. ? ?Past Medical History:  ?Diagnosis Date  ? Alcohol abuse   ? Anemia   ? a. 01/2020 s/p fall w/ significant R hip hematoma-->4 u prbcs during admission.  ? Delirium tremens (HCC) 01/2020  ? Hepatic failure (HCC)   ? a. 01/2020 - ETOH/sepsis/rhabdo  ? Polysubstance abuse (HCC)   ? Respiratory failure (HCC)   ? a. 01/2020 in setting of DTs/Sepsis req intubation.  ? Rhabdomyolysis 01/2020  ? Sepsis (HCC) 01/2020  ? ? ?Past Surgical History:  ?Procedure Laterality Date  ? ESOPHAGOGASTRODUODENOSCOPY (EGD) WITH PROPOFOL N/A 08/03/2021  ? Procedure: ESOPHAGOGASTRODUODENOSCOPY (EGD) WITH PROPOFOL;  Surgeon: Regis Bill, MD;  Location: [facility];  Service: Endoscopy;  Laterality: N/A;  ? ? ?No Known Allergies ? ?  ?Physical Exam: ?General: The patient is alert and oriented x3 in no acute distress. ? ?Dermatology: There continues to be an ulcer noted to the right great toe measuring approximately 1.5x2.5 x 0.2 cm.  There is less fibrotic tissue noted to the wound base today and there is more of a granular wound base which appears healthy and viable.  Although the size of the wound has mostly unchanged.  There is no exposed bone muscle tendon ligament or joint.  No malodor.  Mild serosanguineous drainage. ? ?Vascular: Palpable pedal pulses bilaterally. Capillary refill within normal limits.  Negative for any significant edema or erythema ? ?Neurological: Light touch and protective threshold grossly intact ? ?Musculoskeletal Exam: Dropfoot deformity noted right lower extremity ? ?Radiographic Exam: Last x-rays taken 06/25/2021 of the right foot: Distal soft tissue swelling without underlying bony  abnormality. ? ?Assessment: ?1.  Ulcer right great toe fat layer exposed; improved ? ? ?Plan of Care:  ?1. Patient evaluated.   ?2.  Medically necessary excisional debridement including subcutaneous tissue was performed using a tissue nipper.  Excisional debridement of the necrotic nonviable tissue down to healthier bleeding viable tissue was performed with postdebridement measurement same as pre- ?3. Continue gentamicin cream and light dressing ?4.  Overall there is significant improvement of the wound.  Continue short cam boot.  Weightbearing as tolerated. ?5.  Return to clinic 3 weeks ?  ?Felecia Shelling, DPM ?[facility] ? ?Dr. Felecia Shelling, DPM  ?  ?[address]                                        ?[place], [place] [address]                ?Office [phone]  ?Fax [phone] ? ? ? ? ?

## 2021-08-24 NOTE — Progress Notes (Deleted)
? ?Cardiology Office Note   ? ?Date:  08/24/2021  ? ?ID:  [name], DOB 11/30/1970, MRN [id number] ? ?PCP:  Pcp, No  ?Cardiologist:  Julien Nordmann, MD  ?Electrophysiologist:  None  ? ?Chief Complaint: *** ? ?History of Present Illness:  ? ?[name] is a 51 y.o. male with history of *** ? ?We saw the patient during inpatient consultation in 01/2020 for ***.  We have not evaluated him in the outpatient setting.  He was seen by Dr. Beatrix Fetters, with [facility], in ***. ? ?He was admitted to the hospital in 07/2021 with ***.  He underwent EGD which revealed a healing erosive and reactive gastritis. ? ?*** ? ? ?Labs independently reviewed: ?07/2021 - Hgb 8.5, PLT 162, potassium 4.1, BUN 17, serum creatinine 0.72, albumin 4.3, AST 89, ALT 53 ?06/2021 - magnesium 1.6, A1c 5.0 ?01/2020 - TSH normal ? ?Past Medical History:  ?Diagnosis Date  ? Alcohol abuse   ? Anemia   ? a. 01/2020 s/p fall w/ significant R hip hematoma-->4 u prbcs during admission.  ? Delirium tremens (HCC) 01/2020  ? Hepatic failure (HCC)   ? a. 01/2020 - ETOH/sepsis/rhabdo  ? Polysubstance abuse (HCC)   ? Respiratory failure (HCC)   ? a. 01/2020 in setting of DTs/Sepsis req intubation.  ? Rhabdomyolysis 01/2020  ? Sepsis (HCC) 01/2020  ? ? ?Past Surgical History:  ?Procedure Laterality Date  ? ESOPHAGOGASTRODUODENOSCOPY (EGD) WITH PROPOFOL N/A 08/03/2021  ? Procedure: ESOPHAGOGASTRODUODENOSCOPY (EGD) WITH PROPOFOL;  Surgeon: Regis Bill, MD;  Location: [facility] END[facility];  Service: Endoscopy;  Laterality: N/A;  ? ? ?Current Medications: ?No outpatient medications have been marked as taking for the 08/25/21 encounter (Appointment) with Sondra Barges, PA-C.  ? ? ?Allergies:   Patient has no known allergies.  ? ?Social History  ? ?Socioeconomic History  ? Marital status: Single  ?  Spouse name: Not on file  ? Number of children: Not on file  ? Years of education: Not on file  ? Highest education level: Not on file  ?Occupational History  ? Not  on file  ?Tobacco Use  ? Smoking status: Never  ? Smokeless tobacco: Never  ?Vaping Use  ? Vaping Use: Never used  ?Substance and Sexual Activity  ? Alcohol use: Yes  ?  Comment: 5-6 every other day  ? Drug use: Not Currently  ? Sexual activity: Not on file  ?Other Topics Concern  ? Not on file  ?Social History Narrative  ? Not on file  ? ?Social Determinants of Health  ? ?Financial Resource Strain: Not on file  ?Food Insecurity: Not on file  ?Transportation Needs: Not on file  ?Physical Activity: Not on file  ?Stress: Not on file  ?Social Connections: Not on file  ?  ? ?Family History:  ?The patient's family history is not on file. ? ?ROS:   ?ROS ? ? ?EKGs/Labs/Other Studies Reviewed:   ? ?Studies reviewed were summarized above. The additional studies were reviewed today: ? ?2D echo 02/08/2020: ?1. Left ventricular ejection fraction, by estimation, is 60 to 65%. The  ?left ventricle has normal function. The left ventricle has no regional  ?wall motion abnormalities. Left ventricular diastolic parameters were  ?normal.  ? 2. Right ventricular systolic function is normal. The right ventricular  ?size is normal. There is mildly elevated pulmonary artery systolic  ?pressure. The estimated right ventricular systolic pressure is 40.0 mmHg.  ? 3. Left atrial size was mildly dilated.  ? 4.  Mild mitral valve regurgitation. ? ? ?EKG:  EKG is ordered today.  The EKG ordered today demonstrates *** ? ?Recent Labs: ?06/26/2021: Magnesium 1.6 ?08/03/2021: ALT 53; Platelets 162 ?08/04/2021: BUN 17; Creatinine, Ser 0.72; Hemoglobin 8.5; Potassium 4.1; Sodium 138  ?Recent Lipid Panel ?   ?Component Value Date/Time  ? TRIG 824 (H) 01/31/2020 1612  ? ? ?PHYSICAL EXAM:   ? ?VS:  There were no vitals taken for this visit.  BMI: There is no height or weight on file to calculate BMI. ? ?Physical Exam ? ?Wt Readings from Last 3 Encounters:  ?08/03/21 195 lb (88.5 kg)  ?06/25/21 204 lb 3.2 oz (92.6 kg)  ?06/06/21 200 lb (90.7 kg)  ?   ? ?ASSESSMENT & PLAN:  ? ?*** ? ? ?{Are you ordering a CV Procedure (e.g. stress test, cath, DCCV, TEE, etc)?   Press F2        :[id number]}  ? ? ? ?Disposition: F/u with Dr. Marland Kitchen or an APP in ***. ? ? ?Medication Adjustments/Labs and Tests Ordered: ?Current medicines are reviewed at length with the patient today.  Concerns regarding medicines are outlined above. Medication changes, Labs and Tests ordered today are summarized above and listed in the Patient Instructions accessible in Encounters.  ? ?Signed, ?Eula Listen, PA-C ?08/24/2021 9:05 AM    ? ?[facility] ?[address] ?[place], [place] [address] ?[phone] ?

## 2021-08-25 ENCOUNTER — Telehealth: Payer: Self-pay | Admitting: *Deleted

## 2021-08-25 ENCOUNTER — Ambulatory Visit: Payer: 59 | Admitting: Physician Assistant

## 2021-08-25 NOTE — Telephone Encounter (Signed)
"  I saw the doctor about a week and a half ago.  He was supposed to phone in some more Gentamicin cream.  I guess it never got to the pharmacy.  So if you could ask him about that, I'd really appreciate that." ?

## 2021-08-26 ENCOUNTER — Other Ambulatory Visit: Payer: Self-pay | Admitting: Podiatry

## 2021-08-26 MED ORDER — GENTAMICIN SULFATE 0.1 % EX CREA: 1.0000 "application " | TOPICAL_CREAM | Freq: Two times a day (BID) | CUTANEOUS | 1 refills | Status: DC

## 2021-08-26 NOTE — Telephone Encounter (Signed)
Refill for gentamicin sent.  Thanks, Dr. Logan Bores

## 2021-08-27 NOTE — Telephone Encounter (Signed)
Patient has been notified of medication 

## 2021-09-03 ENCOUNTER — Ambulatory Visit: Payer: 59 | Admitting: Podiatry
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
## 2021-09-14 ENCOUNTER — Ambulatory Visit: Payer: 59 | Admitting: Physician Assistant

## 2021-09-15 ENCOUNTER — Encounter: Payer: Self-pay | Admitting: Physician Assistant

## 2021-09-21 DIAGNOSIS — M48061 Spinal stenosis, lumbar region without neurogenic claudication: Secondary | ICD-10-CM | POA: Diagnosis not present

## 2021-09-21 DIAGNOSIS — M5416 Radiculopathy, lumbar region: Secondary | ICD-10-CM | POA: Diagnosis not present

## 2021-09-22 ENCOUNTER — Telehealth: Payer: Self-pay | Admitting: Podiatry
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
## 2021-09-29 ENCOUNTER — Encounter: Payer: Self-pay | Admitting: Podiatry

## 2021-09-29 ENCOUNTER — Ambulatory Visit: Payer: 59 | Admitting: Podiatry

## 2021-09-29 DIAGNOSIS — L97512 Non-pressure chronic ulcer of other part of right foot with fat layer exposed: Secondary | ICD-10-CM | POA: Diagnosis not present

## 2021-09-29 NOTE — Progress Notes (Signed)
? ?  HPI: 51 y.o. male presenting today for follow-up evaluation of an ulcer to the right great toe.  Patient has been taking ibuprofen 800 mg for the pain.  He was last seen in the office 08/14/2021.  He says that the wound is stable and about the same.  No new complaints at this time ? ?Past Medical History:  ?Diagnosis Date  ? Alcohol abuse   ? Anemia   ? a. 01/2020 s/p fall w/ significant R hip hematoma-->4 u prbcs during admission.  ? Delirium tremens (HCC) 01/2020  ? Hepatic failure (HCC)   ? a. 01/2020 - ETOH/sepsis/rhabdo  ? Polysubstance abuse (HCC)   ? Respiratory failure (HCC)   ? a. 01/2020 in setting of DTs/Sepsis req intubation.  ? Rhabdomyolysis 01/2020  ? Sepsis (HCC) 01/2020  ? ? ?Past Surgical History:  ?Procedure Laterality Date  ? ESOPHAGOGASTRODUODENOSCOPY (EGD) WITH PROPOFOL N/A 08/03/2021  ? Procedure: ESOPHAGOGASTRODUODENOSCOPY (EGD) WITH PROPOFOL;  Surgeon: Regis Bill, MD;  Location: [facility] ENDOSCOPY;  Service: Endoscopy;  Laterality: N/A;  ? ? ?No Known Allergies ? ? ?  ?Physical Exam: ?General: The patient is alert and oriented x3 in no acute distress. ? ?Dermatology: There continues to be an ulcer noted to the right great toe measuring approximately 0.9x0.8 x 0.2 cm.  There is less fibrotic tissue noted to the wound base today and there is more of a granular wound base which appears healthy and viable.  Although the size of the wound has mostly unchanged.  There is no exposed bone muscle tendon ligament or joint.  No malodor.  Mild serosanguineous drainage. ? ?Vascular: Palpable pedal pulses bilaterally. Capillary refill within normal limits.  Negative for any significant edema or erythema ? ?Neurological: Light touch and protective threshold grossly intact ? ?Musculoskeletal Exam: Dropfoot deformity noted right lower extremity ? ?Radiographic Exam: Last x-rays taken 06/25/2021 of the right foot: Distal soft tissue swelling without underlying bony abnormality. ? ?Assessment: ?1.  Ulcer  right great toe fat layer exposed ? ? ?Plan of Care:  ?1. Patient evaluated.   ?2.  Medically necessary excisional debridement including subcutaneous tissue was performed using a tissue nipper.  Excisional debridement of the necrotic nonviable tissue down to healthier bleeding viable tissue was performed with postdebridement measurement same as pre- ?3. Continue gentamicin cream and light dressing.  Patient has gentamicin cream at home ?4.  Resume wearing the cam boot is much as possible.  Patient states that he cannot wear the cam boot while working ?5.  Appointment with Pedorthist for custom molded orthotics to support the arch of the foot and offload pressure from the great toe ulcer ?6.  Return to clinic in 3 weeks ? ?*Working at [employer] ?  ?Felecia Shelling, DPM ?[facility] ? ?Dr. Felecia Shelling, DPM  ?  ?[address]                                        ?[place], [place] [address]                ?Office [phone]  ?Fax [phone] ? ? ? ? ?

## 2021-10-09 ENCOUNTER — Other Ambulatory Visit: Payer: 59

## 2021-10-20 ENCOUNTER — Telehealth: Payer: Self-pay | Admitting: *Deleted

## 2021-10-20 ENCOUNTER — Telehealth: Payer: Self-pay | Admitting: Podiatry

## 2021-10-20 ENCOUNTER — Other Ambulatory Visit: Payer: Self-pay | Admitting: Podiatry

## 2021-10-20 MED ORDER — GENTAMICIN SULFATE 0.1 % EX CREA: 1.0000 "application " | TOPICAL_CREAM | Freq: Two times a day (BID) | CUTANEOUS | 1 refills | Status: DC

## 2021-10-20 MED ORDER — SULFAMETHOXAZOLE-TRIMETHOPRIM 800-160 MG PO TABS: 1.0000 | ORAL_TABLET | Freq: Two times a day (BID) | ORAL | 0 refills | Status: DC

## 2021-10-20 NOTE — Telephone Encounter (Signed)
Prescriptions sent. - Dr. Amalia Hailey

## 2021-10-20 NOTE — Telephone Encounter (Signed)
"  My ankle is swelling again and my foot is acting up again.  I need the cream and the antibiotic that he put me on last time.  It took a turn for the worst.  Can you do that for me until I can make an appointment?  That would be great.  I don't want to let this thing go and end up in the hospital again."

## 2021-10-20 NOTE — Telephone Encounter (Signed)
Pt called in stating that his foot is swollen and painful again. He has an appt scheduled for Friday, 6/9 but would like his RX for Gentamicin cream & antibiotics refilled until he sees Dr. Amalia Hailey. Pt stated that both Rxs helped w/ the pain and swelling. He uses the [facility].

## 2021-10-20 NOTE — Telephone Encounter (Signed)
Both prescriptions sent to pharmacy.  Thanks, Dr. Logan Bores

## 2021-10-30 ENCOUNTER — Ambulatory Visit: Payer: 59 | Admitting: Podiatry

## 2021-12-10 DIAGNOSIS — L97519 Non-pressure chronic ulcer of other part of right foot with unspecified severity: Secondary | ICD-10-CM | POA: Diagnosis not present

## 2021-12-10 DIAGNOSIS — Z1389 Encounter for screening for other disorder: Secondary | ICD-10-CM | POA: Diagnosis not present

## 2021-12-10 DIAGNOSIS — M792 Neuralgia and neuritis, unspecified: Secondary | ICD-10-CM | POA: Diagnosis not present

## 2021-12-10 DIAGNOSIS — R69 Illness, unspecified: Secondary | ICD-10-CM | POA: Diagnosis not present

## 2021-12-10 DIAGNOSIS — Z1331 Encounter for screening for depression: Secondary | ICD-10-CM | POA: Diagnosis not present

## 2021-12-10 DIAGNOSIS — S80862A Insect bite (nonvenomous), left lower leg, initial encounter: Secondary | ICD-10-CM | POA: Diagnosis not present

## 2021-12-15 ENCOUNTER — Ambulatory Visit: Payer: 59 | Admitting: Podiatry

## 2021-12-15 DIAGNOSIS — L97512 Non-pressure chronic ulcer of other part of right foot with fat layer exposed: Secondary | ICD-10-CM | POA: Diagnosis not present

## 2021-12-15 NOTE — Progress Notes (Signed)
   Chief Complaint  Patient presents with   Follow-up    1 month follow-up for great toe ulcer right foot.patient states that the toe is slowly healing.    HPI: 51 y.o. male presenting today for follow-up evaluation of an ulcer to the right great toe.  Patient states that he was unable to wear the cam boot as instructed on the past 2 months.  He has been applying the gentamicin cream with a light dressing.  Recently he was prescribed Augmentin for an unrelated issue by his PCP.  He just began taking it today.  Presenting for follow-up treatment and evaluation  Past Medical History:  Diagnosis Date   Alcohol abuse    Anemia    a. 01/2020 s/p fall w/ significant R hip hematoma-->4 u prbcs during admission.   Delirium tremens (HCC) 01/2020   Hepatic failure (HCC)    a. 01/2020 - ETOH/sepsis/rhabdo   Polysubstance abuse (HCC)    Respiratory failure (HCC)    a. 01/2020 in setting of DTs/Sepsis req intubation.   Rhabdomyolysis 01/2020   Sepsis (HCC) 01/2020    Past Surgical History:  Procedure Laterality Date   ESOPHAGOGASTRODUODENOSCOPY (EGD) WITH PROPOFOL N/A 08/03/2021   Procedure: ESOPHAGOGASTRODUODENOSCOPY (EGD) WITH PROPOFOL;  Surgeon: Regis Bill, MD;  Location: [facility] ENDOSCOPY;  Service: Endoscopy;  Laterality: N/A;    No Known Allergies       Physical Exam: General: The patient is alert and oriented x3 in no acute distress.  Dermatology: There continues to be an ulcer noted to the right great toe measuring approximately 1.2x0.8 x 0.2 cm.  Granular wound base.  No exposed bone muscle tendon ligament or joint.  Minimal serous drainage.  Periwound is macerated.  Vascular: Palpable pedal pulses bilaterally. Capillary refill within normal limits.  Mild edema with slight erythema to the right great toe.  Clinically there is no concern for vascular compromise  Neurological: Light touch and protective threshold diminished.  Musculoskeletal Exam: Dropfoot deformity noted  right lower extremity  Radiographic Exam: Last x-rays taken 06/25/2021 of the right foot: Distal soft tissue swelling without underlying bony abnormality.  Assessment: 1.  Ulcer right great toe fat layer exposed   Plan of Care:  1. Patient evaluated.   2.  Medically necessary excisional debridement including subcutaneous tissue was performed using a tissue nipper.  Excisional debridement of the necrotic nonviable tissue down to healthier bleeding viable tissue was performed with postdebridement measurement same as pre- 3. Continue gentamicin cream and light dressing.  Patient has gentamicin cream at home 4.  Patient unable to wear the cam boot.  He also was unable to get the custom orthotics.  Offloading felt dancers pads were applied to the insoles of the patient's shoes to offload pressure from the great toe  5. Return to clinic in 3 weeks  *Working at [employer]   Felecia Shelling, DPM [facility]  Dr. Felecia Shelling, DPM    [address] [place], [place] [address]                Office [phone]  Fax [phone]

## 2022-01-05 ENCOUNTER — Ambulatory Visit: Payer: 59 | Admitting: Podiatry

## 2022-01-29 ENCOUNTER — Ambulatory Visit: Payer: 59 | Admitting: Podiatry

## 2022-04-27 ENCOUNTER — Other Ambulatory Visit: Payer: Self-pay | Admitting: Nurse Practitioner

## 2022-04-27 ENCOUNTER — Ambulatory Visit
Admission: RE | Admit: 2022-04-27 | Discharge: 2022-04-27 | Disposition: A | Discharge status: Home / Self Care | Payer: 59 | Source: Ambulatory Visit | Attending: Nurse Practitioner | Admitting: Nurse Practitioner

## 2022-04-27 ENCOUNTER — Ambulatory Visit
Admission: RE | Admit: 2022-04-27 | Discharge: 2022-04-27 | Disposition: A | Discharge status: Home / Self Care | Payer: 59 | Attending: Nurse Practitioner | Admitting: Nurse Practitioner

## 2022-04-27 DIAGNOSIS — L97519 Non-pressure chronic ulcer of other part of right foot with unspecified severity: Secondary | ICD-10-CM

## 2022-05-07 ENCOUNTER — Ambulatory Visit: Payer: 59 | Admitting: Podiatry

## 2022-05-07 DIAGNOSIS — L97512 Non-pressure chronic ulcer of other part of right foot with fat layer exposed: Secondary | ICD-10-CM | POA: Diagnosis not present

## 2022-05-07 MED ORDER — MUPIROCIN 2 % EX OINT: 1.0000 | TOPICAL_OINTMENT | Freq: Two times a day (BID) | CUTANEOUS | 1 refills | Status: DC

## 2022-05-07 NOTE — Progress Notes (Signed)
   Chief Complaint  Patient presents with   foot care    Patient is here for right foot toe ulcer.    HPI: 51 y.o. male presenting today for follow-up evaluation of an ulcer to the right great toe.  Patient last seen in the office 12/15/2021.  Patient states that recently the wound returned and he was placed on some oral antibiotics from his PCP as well as mupirocin ointment.  Presenting for further treatment and evaluation  Past Medical History:  Diagnosis Date   Alcohol abuse    Anemia    a. 01/2020 s/p fall w/ significant R hip hematoma-->4 u prbcs during admission.   Delirium tremens (HCC) 01/2020   Hepatic failure (HCC)    a. 01/2020 - ETOH/sepsis/rhabdo   Polysubstance abuse (HCC)    Respiratory failure (HCC)    a. 01/2020 in setting of DTs/Sepsis req intubation.   Rhabdomyolysis 01/2020   Sepsis (HCC) 01/2020    Past Surgical History:  Procedure Laterality Date   ESOPHAGOGASTRODUODENOSCOPY (EGD) WITH PROPOFOL N/A 08/03/2021   Procedure: ESOPHAGOGASTRODUODENOSCOPY (EGD) WITH PROPOFOL;  Surgeon: Regis Bill, MD;  Location: [facility] ENDOSCOPY;  Service: Endoscopy;  Laterality: N/A;    No Known Allergies   Physical Exam: General: The patient is alert and oriented x3 in no acute distress.  Dermatology: There continues to be an ulcer noted to the right great toe measuring approximately 0.8 x 0.8 x 0.2 cm.  Granular wound base.  No exposed bone muscle tendon ligament or joint.  Scant serous drainage noted.  Overall it appears very stable with a healthy granular wound base and good potential for healing  Vascular: Palpable pedal pulses bilaterally. Capillary refill within normal limits.  No erythema or edema noted to the toe today.  Clinically there is no concern for vascular compromise  Neurological: Light touch and protective threshold diminished.  Musculoskeletal Exam: Dropfoot deformity noted right lower extremity.  Assessment: 1.  Ulcer right great toe fat layer  exposed   Plan of Care:  1. Patient evaluated.   2.  Medically necessary excisional debridement including subcutaneous tissue was performed using a tissue nipper.  Excisional debridement of the necrotic nonviable tissue down to healthier bleeding viable tissue was performed with postdebridement measurement same as pre- 3. Continue gentamicin cream or mupirocin ointment and light dressing.  Patient has both at home  4.  Today also we did discuss possible surgery/IPJ fusion which would likely reduce pressure on the forefoot wound area and allow for potential healing of the wound.  Currently he is very busy and we will discuss after the holidays in more detail 5. Return to clinic in 3 weeks  *Working at [employer]   Felecia Shelling, DPM [facility]  Dr. Felecia Shelling, DPM    [address] [place], [place] [address]                Office [phone]  Fax [phone]

## 2022-06-08 ENCOUNTER — Ambulatory Visit: Payer: 59 | Admitting: Urology

## 2022-06-11 ENCOUNTER — Ambulatory Visit: Payer: 59 | Admitting: Urology

## 2022-06-17 ENCOUNTER — Other Ambulatory Visit: Payer: Self-pay

## 2022-06-17 ENCOUNTER — Emergency Department: Payer: 59

## 2022-06-17 ENCOUNTER — Emergency Department
Admission: EM | Admit: 2022-06-17 | Discharge: 2022-06-17 | Disposition: A | Discharge status: Home / Self Care | Payer: 59 | Attending: Emergency Medicine | Admitting: Emergency Medicine

## 2022-06-17 DIAGNOSIS — S0181XA Laceration without foreign body of other part of head, initial encounter: Secondary | ICD-10-CM | POA: Diagnosis not present

## 2022-06-17 DIAGNOSIS — Y92410 Unspecified street and highway as the place of occurrence of the external cause: Secondary | ICD-10-CM | POA: Diagnosis not present

## 2022-06-17 DIAGNOSIS — Z5321 Procedure and treatment not carried out due to patient leaving prior to being seen by health care provider: Secondary | ICD-10-CM | POA: Diagnosis not present
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
## 2022-06-25 ENCOUNTER — Ambulatory Visit: Payer: 59 | Admitting: Urology

## 2022-07-15 ENCOUNTER — Telehealth: Payer: 59 | Admitting: Physician Assistant

## 2022-07-15 DIAGNOSIS — Z760 Encounter for issue of repeat prescription: Secondary | ICD-10-CM | POA: Diagnosis not present

## 2022-07-15 DIAGNOSIS — L97501 Non-pressure chronic ulcer of other part of unspecified foot limited to breakdown of skin: Secondary | ICD-10-CM

## 2022-07-15 MED ORDER — TIZANIDINE HCL 4 MG PO TABS: 4.0000 mg | ORAL_TABLET | Freq: Three times a day (TID) | ORAL | 0 refills | Status: DC

## 2022-07-15 MED ORDER — MUPIROCIN 2 % EX OINT: 1.0000 | TOPICAL_OINTMENT | Freq: Two times a day (BID) | CUTANEOUS | 1 refills | Status: DC

## 2022-07-15 MED ORDER — SULFAMETHOXAZOLE-TRIMETHOPRIM 800-160 MG PO TABS: 1.0000 | ORAL_TABLET | Freq: Two times a day (BID) | ORAL | 0 refills | Status: DC
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
## 2022-07-15 NOTE — Patient Instructions (Signed)
Quitman Livings, thank you for joining Cody Rio, PA-C for today's virtual visit.  While this provider is not your primary care provider (PCP), if your PCP is located in our provider database this encounter information will be shared with them immediately following your visit.   [facility] account gives you access to today's visit and all your visits, tests, and labs performed at [facility] " click here if you don't have a [facility] account or go to [email or web address]  Consent: (Patient) Artyom Boomer provided verbal consent for this virtual visit at the beginning of the encounter.  Current Medications:  Current Outpatient Medications:    aspirin 81 MG chewable tablet, Chew 81 mg by mouth daily., Disp: , Rfl:    ferrous sulfate 325 (65 FE) MG tablet, Take 1 tablet (325 mg total) by mouth daily with breakfast., Disp: 30 tablet, Rfl: 0   gentamicin cream (GARAMYCIN) 0.1 %, Apply 1 application. topically 2 (two) times daily., Disp: 30 g, Rfl: 1   hydrochlorothiazide (HYDRODIURIL) 12.5 MG tablet, Take 12.5 mg by mouth daily., Disp: , Rfl:    lactulose (CHRONULAC) 10 GM/15ML solution, SMARTSIG:Tablespoon By Mouth Daily, Disp: , Rfl:    metoprolol succinate (TOPROL-XL) 25 MG 24 hr tablet, Take 25 mg by mouth daily., Disp: , Rfl:    Multiple Vitamins-Minerals (MULTIVITAMIN WITH MINERALS) tablet, Take 1 tablet by mouth daily., Disp: , Rfl:    mupirocin ointment (BACTROBAN) 2 %, Apply 1 Application topically 2 (two) times daily., Disp: 22 g, Rfl: 1   nortriptyline (PAMELOR) 10 MG capsule, Take 20 mg by mouth at bedtime., Disp: , Rfl:    pantoprazole (PROTONIX) 40 MG tablet, Take 1 tablet (40 mg total) by mouth 2 (two) times daily before a meal., Disp: 60 tablet, Rfl: 1   sertraline (ZOLOFT) 25 MG tablet, Take 50 mg by mouth at bedtime., Disp: , Rfl:    sulfamethoxazole-trimethoprim (BACTRIM DS) 800-160 MG tablet, Take 1 tablet by mouth 2 (two)  times daily., Disp: 20 tablet, Rfl: 0   tiZANidine (ZANAFLEX) 4 MG tablet, Take 4 mg by mouth 3 (three) times daily., Disp: , Rfl:    Medications ordered in this encounter:  No orders of the defined types were placed in this encounter.    *If you need refills on other medications prior to your next appointment, please contact your pharmacy*  Follow-Up: Call back or seek an in-person evaluation if the symptoms worsen or if the condition fails to improve as anticipated.  Rodriguez Hevia [phone]  Other Instructions Please take antibiotic as directed. Keep skin clean and dry, applying a very thin layer of the mupirocin as directed. Contact your Podiatrist to schedule an in-person evaluation within a week. If anything is worsening despite treatment, you need an in-person evaluation ASAP.  I have sent in a one-time refill of your Tizanidine to use as directed.    If you have been instructed to have an in-person evaluation today at a local Urgent Care facility, please use the link below. It will take you to a list of all of our available [facility], including address, phone number and hours of operation. Please do not delay care.  [facility]  If you or a family member do not have a primary care provider, use the link below to schedule a visit and establish care. When you choose a [facility] primary care physician or advanced practice provider, you gain a  long-term partner in health. Find a Primary Care Provider  Learn more about 's in-office and virtual care options: [facility]

## 2022-07-21 ENCOUNTER — Other Ambulatory Visit: Payer: Self-pay | Admitting: Nurse Practitioner

## 2022-07-21 DIAGNOSIS — K7460 Unspecified cirrhosis of liver: Secondary | ICD-10-CM

## 2022-07-22 ENCOUNTER — Encounter: Payer: Self-pay | Admitting: Urology

## 2022-07-22 ENCOUNTER — Ambulatory Visit (INDEPENDENT_AMBULATORY_CARE_PROVIDER_SITE_OTHER): Payer: 59 | Admitting: Urology

## 2022-07-22 VITALS — BP 126/77 | HR 80 | Ht 69.0 in | Wt 215.0 lb

## 2022-07-22 DIAGNOSIS — N529 Male erectile dysfunction, unspecified: Secondary | ICD-10-CM

## 2022-07-22 NOTE — Progress Notes (Signed)
07/22/2022 2:32 PM   [name] Sep 03, 1970 [id number]  Referring provider: Brynda Greathouse, [address] [place],  [place] [address]  Chief Complaint  Patient presents with   Erectile Dysfunction    HPI: [name] is a 52 y.o. male referred for evaluation of erectile dysfunction.  Several month history of difficulty achieving and maintaining an erection. He is able to achieve an erection firm enough for penetration much less than 50% of the time and will have difficulty maintaining the erection when he is able to achieve penetration SHIM 7/25 indicating severe ED History of hypertension however he is presently on no medications and controlling with diet and exercise.  History of alcohol abuse and hepatic failure He has tried online sildenafil which he states was not effective and he had side effects of palpitations PCP indicated a testosterone level was drawn but no results were forwarded He does complain of tiredness, fatigue and decreased libido and feels he does have low T   PMH: Past Medical History:  Diagnosis Date   Alcohol abuse    Anemia    a. 01/2020 s/p fall w/ significant R hip hematoma-->4 u prbcs during admission.   Delirium tremens ([place]) 01/2020   Hepatic failure ([place])    a. 01/2020 - ETOH/sepsis/rhabdo   Polysubstance abuse ([place])    Respiratory failure ([place])    a. 01/2020 in setting of DTs/Sepsis req intubation.   Rhabdomyolysis 01/2020   Sepsis ([place]) 01/2020    Surgical History: Past Surgical History:  Procedure Laterality Date   ESOPHAGOGASTRODUODENOSCOPY (EGD) WITH PROPOFOL N/A 08/03/2021   Procedure: ESOPHAGOGASTRODUODENOSCOPY (EGD) WITH PROPOFOL;  Surgeon: Lesly Rubenstein, MD;  Location: [facility] ENDOSCOPY;  Service: Endoscopy;  Laterality: N/A;    Home Medications:  Allergies as of 07/22/2022   No Known Allergies      Medication List        Accurate as of July 22, 2022  2:32 PM. If you have any questions, ask your  nurse or doctor.          STOP taking these medications    ferrous sulfate 325 (65 FE) MG tablet Stopped by: Abbie Sons, MD   metoprolol succinate 25 MG 24 hr tablet Commonly known as: TOPROL-XL Stopped by: Abbie Sons, MD   nortriptyline 10 MG capsule Commonly known as: PAMELOR Stopped by: Abbie Sons, MD   sertraline 25 MG tablet Commonly known as: ZOLOFT Stopped by: Abbie Sons, MD       TAKE these medications    aspirin 81 MG chewable tablet Chew 81 mg by mouth daily.   hydrochlorothiazide 12.5 MG tablet Commonly known as: HYDRODIURIL Take 12.5 mg by mouth daily.   lactulose 10 GM/15ML solution Commonly known as: CHRONULAC SMARTSIG:Tablespoon By Mouth Daily   multivitamin with minerals tablet Take 1 tablet by mouth daily.   mupirocin ointment 2 % Commonly known as: BACTROBAN Apply 1 Application topically 2 (two) times daily.   pantoprazole 40 MG tablet Commonly known as: Protonix Take 1 tablet (40 mg total) by mouth 2 (two) times daily before a meal.   sulfamethoxazole-trimethoprim 800-160 MG tablet Commonly known as: BACTRIM DS Take 1 tablet by mouth 2 (two) times daily.   tiZANidine 4 MG tablet Commonly known as: ZANAFLEX Take 1 tablet (4 mg total) by mouth 3 (three) times daily.        Allergies: No Known Allergies  Family History: History reviewed. No pertinent family history.  Social History:  reports that he has never smoked. He has never used smokeless tobacco. He reports current alcohol use. He reports that he does not currently use drugs.   Physical Exam: BP 126/77   Pulse 80   Ht '5\' 9"'$  (1.753 m)   Wt 215 lb (97.5 kg)   BMI 31.75 kg/m   Constitutional:  Alert and oriented, No acute distress. HEENT: [place] AT Respiratory: Normal respiratory effort, no increased work of breathing. GU: Phallus without lesions.  Testes descended bilaterally without masses or tenderness.  Estimated volume 10 cc left/12 cc  right Psychiatric: Normal mood and affect.   Assessment & Plan:    Erectile dysfunction Does complain of tiredness, fatigue and decreased libido Mild testis atrophy on exam Will check with referring practice to see if a testosterone level drawn and request results Schedule a.m. testosterone level/LH   Abbie Sons, MD  [facility] [address], [place], [place] [address] [phone]

## 2022-07-23 ENCOUNTER — Other Ambulatory Visit: Payer: Self-pay

## 2022-07-23 ENCOUNTER — Emergency Department: Payer: Managed Care, Other (non HMO)

## 2022-07-23 ENCOUNTER — Emergency Department
Admission: EM | Admit: 2022-07-23 | Discharge: 2022-07-23 | Disposition: A | Discharge status: Home / Self Care | Payer: Managed Care, Other (non HMO) | Attending: Student in an Organized Health Care Education/Training Program | Admitting: Student in an Organized Health Care Education/Training Program

## 2022-07-23 ENCOUNTER — Encounter: Payer: Self-pay | Admitting: Emergency Medicine

## 2022-07-23 DIAGNOSIS — M25562 Pain in left knee: Secondary | ICD-10-CM | POA: Diagnosis present

## 2022-07-23 DIAGNOSIS — M25571 Pain in right ankle and joints of right foot: Secondary | ICD-10-CM | POA: Insufficient documentation
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
## 2022-07-23 NOTE — Discharge Instructions (Addendum)
It appears that you have an avulsion fracture to your right ankle.  You may wear the brace while ambulating, remove it at night.  Continue to rest, ice, elevate your leg.  As for your knee, you have normal x-ray, however it is possible that you have a meniscus injury.  Please follow-up with orthopedics regarding both of these concerns.  Please return for any new, worsening, or change in symptoms or other concerns.  It was a pleasure caring for you today.
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
## 2022-07-26 ENCOUNTER — Other Ambulatory Visit: Payer: Self-pay

## 2022-07-26 DIAGNOSIS — R6882 Decreased libido: Secondary | ICD-10-CM

## 2022-07-26 DIAGNOSIS — R5383 Other fatigue: Secondary | ICD-10-CM

## 2022-07-27 ENCOUNTER — Other Ambulatory Visit: Payer: Medicaid Other

## 2022-07-27 DIAGNOSIS — R5383 Other fatigue: Secondary | ICD-10-CM

## 2022-07-27 DIAGNOSIS — R6882 Decreased libido: Secondary | ICD-10-CM

## 2022-07-28 ENCOUNTER — Ambulatory Visit: Admission: RE | Admit: 2022-07-28 | Payer: Medicaid Other | Source: Ambulatory Visit

## 2022-07-28 LAB — TESTOSTERONE: Testosterone: 168 ng/dL — ABNORMAL LOW (ref 264–916)

## 2022-07-28 LAB — LUTEINIZING HORMONE: LH: 8.4 m[IU]/mL (ref 1.7–8.6)

## 2022-08-03 ENCOUNTER — Ambulatory Visit: Payer: Medicaid Other | Admitting: Podiatry

## 2022-08-04 ENCOUNTER — Telehealth: Payer: Self-pay | Admitting: Urology

## 2022-08-04 DIAGNOSIS — R6882 Decreased libido: Secondary | ICD-10-CM

## 2022-08-04 NOTE — Telephone Encounter (Signed)
Patient called and asked if he needed to have labs done again. He said he was told by someone here that he would need to have labs done twice in order for his insurance to cover. Please advise patient.

## 2022-08-04 NOTE — Telephone Encounter (Signed)
Advised patient that he has to have two low testosterone in other for insurance to close medication. Appt made for testosterone

## 2022-08-06 ENCOUNTER — Ambulatory Visit: Payer: Medicaid Other | Admitting: Podiatry

## 2022-08-06 DIAGNOSIS — L97512 Non-pressure chronic ulcer of other part of right foot with fat layer exposed: Secondary | ICD-10-CM | POA: Diagnosis not present

## 2022-08-06 DIAGNOSIS — S93401A Sprain of unspecified ligament of right ankle, initial encounter: Secondary | ICD-10-CM
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
## 2022-08-10 ENCOUNTER — Other Ambulatory Visit: Payer: Medicaid Other

## 2022-08-10 ENCOUNTER — Ambulatory Visit: Payer: 59 | Admitting: Podiatry

## 2022-08-10 DIAGNOSIS — R6882 Decreased libido: Secondary | ICD-10-CM

## 2022-08-11 ENCOUNTER — Telehealth: Payer: Self-pay | Admitting: Urology

## 2022-08-11 LAB — TESTOSTERONE: Testosterone: 194 ng/dL — ABNORMAL LOW (ref 264–916)

## 2022-08-11 NOTE — Telephone Encounter (Signed)
Patient called to ask about prescription for testosterone. He said he had his 2nd labwork appt yesterday, that his insurance required, and wants to know if prescription will be sent in now. His pharmacy is [facility] [address]

## 2022-08-12 ENCOUNTER — Encounter: Payer: Self-pay | Admitting: *Deleted

## 2022-08-12 MED ORDER — TESTOSTERONE 20.25 MG/ACT (1.62%) TD GEL: TRANSDERMAL | 1 refills | Status: DC

## 2022-08-12 NOTE — Telephone Encounter (Signed)
Notified patient as instructed, patient pleased. Discussed follow-up appointments, patient agrees  

## 2022-08-12 NOTE — Telephone Encounter (Signed)
Testosterone level low and normal LH.  His options are oral Clomid, testosterone injections and testosterone gel.  The Clomid stimulates the testicles to produce greater amounts of testosterone but is used off label and would not be covered by insurance.  It runs about $50 a month

## 2022-08-12 NOTE — Telephone Encounter (Signed)
Rx sent; sched f/u appt with T level 2-3 months

## 2022-08-12 NOTE — Telephone Encounter (Signed)
Pt called back and would like to try the gel.

## 2022-08-24 ENCOUNTER — Ambulatory Visit: Payer: Medicaid Other | Admitting: Physician Assistant

## 2022-09-20 ENCOUNTER — Encounter: Payer: Medicaid Other | Attending: Physician Assistant | Admitting: Physician Assistant

## 2022-09-20 DIAGNOSIS — G603 Idiopathic progressive neuropathy: Secondary | ICD-10-CM | POA: Insufficient documentation

## 2022-09-20 DIAGNOSIS — Z87891 Personal history of nicotine dependence: Secondary | ICD-10-CM | POA: Insufficient documentation

## 2022-09-20 DIAGNOSIS — F10188 Alcohol abuse with other alcohol-induced disorder: Secondary | ICD-10-CM | POA: Insufficient documentation

## 2022-09-20 DIAGNOSIS — L97512 Non-pressure chronic ulcer of other part of right foot with fat layer exposed: Secondary | ICD-10-CM | POA: Diagnosis present

## 2022-09-20 DIAGNOSIS — Z09 Encounter for follow-up examination after completed treatment for conditions other than malignant neoplasm: Secondary | ICD-10-CM | POA: Diagnosis not present

## 2022-09-20 DIAGNOSIS — I480 Paroxysmal atrial fibrillation: Secondary | ICD-10-CM | POA: Diagnosis not present

## 2022-09-20 NOTE — Progress Notes (Signed)
CARMEN, VALLECILLO [name] ([id number]) [id number] Page 1 of 7 Visit Report for 09/20/2022 Allergy List Details Patient Name: Date of Service: [name], [name]. 09/20/2022 9:30 A M Medical Record Number: [id number] Patient Account Number: [id number] Date of Birth/Sex: Treating RN: 1970-12-01 (51 y.o. Judie Petit) Yevonne Pax Primary Care Elinor Kleine: SYSTEM, PCP Other Clinician: Referring Tishanna Dunford: Treating Athina Fahey/Extender: Baxter Kail Weeks in Treatment: 0 Allergies Active Allergies No Known Allergies Allergy Notes Electronic Signature(s) Signed: 09/20/2022 11:25:56 AM By: Yevonne Pax RN Entered By: Yevonne Pax on 09/20/2022 09:53:18 -------------------------------------------------------------------------------- Arrival Information Details Patient Name: Date of Service: [name], Cody Grooms [name]. 09/20/2022 9:30 A M Medical Record Number: [id number] Patient Account Number: [id number] Date of Birth/Sex: Treating RN: [birth year] (51 y.o. Judie Petit) Yevonne Pax Primary Care Kc Sedlak: SYSTEM, PCP Other Clinician: Referring Amariss Detamore: Treating Betsi Crespi/Extender: Florentina Jenny in Treatment: 0 Visit Information Patient Arrived: Ambulatory Arrival Time: 09:51 Accompanied By: self Transfer Assistance: None Patient Identification Verified: Yes Secondary Verification Process Completed: Yes Patient Requires Transmission-Based Precautions: No Patient Has Alerts: No Electronic Signature(s) Signed: 09/20/2022 11:25:56 AM By: Yevonne Pax RN Entered By: Yevonne Pax on 09/20/2022 09:51:18 -------------------------------------------------------------------------------- Clinic Level of Care Assessment Details Patient Name: Date of Service: [name] 09/20/2022 9:30 A M Medical Record Number: [id number] Patient Account Number: [id number] Date of Birth/Sex: Treating RN: February 20, 1971 (51 y.o. Judie Petit) Yevonne Pax Primary Care Breawna Montenegro: SYSTEM, PCP Other  Clinician: Referring Janette Harvie: Treating Kaylea Mounsey/Extender: Florentina Jenny in Treatment: 0 Clinic Level of Care Assessment Items TOOL 1 Quantity Score X- 1 0 Use when EandM and Procedure is performed on INITIAL visit ASSESSMENTS - Nursing Assessment / Reassessment X- 1 20 General Physical Exam (combine w/ comprehensive assessment (listed just below) when performed on new pt. evals) X- 1 25 Comprehensive Assessment (HX, ROS, Risk Assessments, Wounds Hx, etc.) IAM, LIPSON [name] ([id number]) [id number] Page 2 of 7 ASSESSMENTS - Wound and Skin Assessment / Reassessment X- 1 10 Dermatologic / Skin Assessment (not related to wound area) ASSESSMENTS - Ostomy and/or Continence Assessment and Care []  - 0 Incontinence Assessment and Management []  - 0 Ostomy Care Assessment and Management (repouching, etc.) PROCESS - Coordination of Care X - Simple Patient / Family Education for ongoing care 1 15 []  - 0 Complex (extensive) Patient / Family Education for ongoing care []  - 0 Staff obtains Chiropractor, Records, [name] []  - 0 Staff telephones HHA, Nursing Homes / Clarify orders / etc []  - 0 Routine Transfer to another Facility (non-emergent condition) []  - 0 Routine Hospital Admission (non-emergent condition) X- 1 15 New Admissions / Manufacturing engineer / Ordering NPWT Apligraf, etc. , []  - 0 Emergency Hospital Admission (emergent condition) PROCESS - Special Needs []  - 0 Pediatric / Minor Patient Management []  - 0 Isolation Patient Management []  - 0 Hearing / Language / Visual special needs []  - 0 Assessment of Community assistance (transportation, D/C planning, etc.) []  - 0 Additional assistance / Altered mentation []  - 0 Support Surface(s) Assessment (bed, cushion, seat, etc.) INTERVENTIONS - Miscellaneous []  - 0 External ear exam []  - 0 Patient Transfer (multiple staff / Nurse, adult / Similar devices) []  -  0 Simple Staple / Suture removal (25 or less) []  - 0 Complex Staple / Suture removal (26 or more) []  - 0 Hypo/Hyperglycemic Management (do not check if billed separately) X- 1 15 Ankle / Brachial Index (ABI) - do not check if billed separately Has the patient been seen  at the hospital within the last three years: Yes Total Score: 100 Level Of Care: New/Established - Level 3 Electronic Signature(s) Signed: 09/20/2022 11:25:56 AM By: Yevonne Pax RN Entered By: Yevonne Pax on 09/20/2022 10:28:40 -------------------------------------------------------------------------------- Encounter Discharge Information Details Patient Name: Date of Service: [name], Cody Grooms [name]. 09/20/2022 9:30 A M Medical Record Number: [id number] Patient Account Number: [id number] Date of Birth/Sex: Treating RN: 1971-01-25 (51 y.o. Judie Petit) Yevonne Pax Primary Care Selmer Adduci: SYSTEM, PCP Other Clinician: Referring Savreen Gebhardt: Treating Clora Ohmer/Extender: Florentina Jenny in Treatment: 0 Encounter Discharge Information Items Post Procedure Vitals Discharge Condition: Stable Temperature (F): 98.1 Ambulatory Status: Ambulatory Pulse (bpm): 73 Discharge Destination: Home Respiratory Rate (breaths/min): 18 Transportation: Private Auto Blood Pressure (mmHg): 175/83 Accompanied By: self Schedule Follow-up Appointment: Yes Clinical Summary of Care: [name], [name] ([id number]) [id number] Page 3 of 7 Electronic Signature(s) Signed: 09/20/2022 11:25:56 AM By: Yevonne Pax RN Entered By: Yevonne Pax on 09/20/2022 10:29:28 -------------------------------------------------------------------------------- Lower Extremity Assessment Details Patient Name: Date of Service: [name], [name]. 09/20/2022 9:30 A M Medical Record Number: [id number] Patient Account Number: [id number] Date of Birth/Sex: Treating RN: Jul 30, 1970 (51 y.o. Judie Petit) Yevonne Pax Primary Care Yarelin Reichardt: SYSTEM, PCP Other  Clinician: Referring Irem Stoneham: Treating Emrie Gayle/Extender: Baxter Kail Weeks in Treatment: 0 Edema Assessment Assessed: [Left: No] [Right: No] Edema: [Left: Ye] [Right: s] Calf Left: Right: Point of Measurement: 34 cm From Medial Instep 36 cm Ankle Left: Right: Point of Measurement: 11 cm From Medial Instep 25 cm Knee To Floor Left: Right: From Medial Instep 44 cm Vascular Assessment Pulses: Dorsalis Pedis Palpable: [Right:Yes] Notes ankle fracture - per patient workmans comp Electronic Signature(s) Signed: 09/20/2022 11:25:56 AM By: Yevonne Pax RN Entered By: Yevonne Pax on 09/20/2022 10:05:53 -------------------------------------------------------------------------------- Multi Wound Chart Details Patient Name: Date of Service: [name], Cody Grooms [name]. 09/20/2022 9:30 A M Medical Record Number: [id number] Patient Account Number: [id number] Date of Birth/Sex: Treating RN: Sep 23, 1970 (51 y.o. Melonie [place] Primary Care Jung Ingerson: SYSTEM, PCP Other Clinician: Referring Davion Meara: Treating Herold Salguero/Extender: Baxter Kail Weeks in Treatment: 0 Vital Signs Height(in): 68 Pulse(bpm): 73 Weight(lbs): 210 Blood Pressure(mmHg): 175/83 Body Mass Index(BMI): 31.9 Temperature(F): 98.1 Respiratory Rate(breaths/min): 18 [Treatment Notes:Wound Assessments Treatment Notes] Electronic Signature(s) [name], [name] ([id number]) [id number] Page 4 of 7 Signed: 09/20/2022 11:25:56 AM By: Yevonne Pax RN Entered By: Yevonne Pax on 09/20/2022 10:02:53 -------------------------------------------------------------------------------- Multi-Disciplinary Care Plan Details Patient Name: Date of Service: Cody [place], [name]. 09/20/2022 9:30 A M Medical Record Number: [id number] Patient Account Number: [id number] Date of Birth/Sex: Treating RN: June 23, 1970 (51 y.o. Judie Petit) Yevonne Pax Primary Care Gordie Belvin: SYSTEM, PCP Other Clinician: Referring  Jihaad Bruschi: Treating Datra Clary/Extender: Florentina Jenny in Treatment: 0 Active Inactive Necrotic Tissue Nursing Diagnoses: Knowledge deficit related to management of necrotic/devitalized tissue Goals: Necrotic/devitalized tissue will be minimized in the wound bed Date Initiated: 09/20/2022 Target Resolution Date: 10/20/2022 Goal Status: Active Interventions: Assess patient pain level pre-, during and post procedure and prior to discharge Notes: Wound/Skin Impairment Nursing Diagnoses: Knowledge deficit related to ulceration/compromised skin integrity Goals: Patient/caregiver will verbalize understanding of skin care regimen Date Initiated: 09/20/2022 Target Resolution Date: 10/20/2022 Goal Status: Active Ulcer/skin breakdown will have a volume reduction of 30% by week 4 Date Initiated: 09/20/2022 Target Resolution Date: 10/20/2022 Goal Status: Active Ulcer/skin breakdown will have a volume reduction of 50% by week 8 Date Initiated: 09/20/2022 Target Resolution Date: 11/20/2022 Goal Status: Active Ulcer/skin breakdown will have a volume reduction of 80% by  week 12 Date Initiated: 09/20/2022 Target Resolution Date: 12/20/2022 Goal Status: Active Ulcer/skin breakdown will heal within 14 weeks Date Initiated: 09/20/2022 Target Resolution Date: 01/20/2023 Goal Status: Active Interventions: Assess patient/caregiver ability to obtain necessary supplies Assess patient/caregiver ability to perform ulcer/skin care regimen upon admission and as needed Assess ulceration(s) every visit Notes: Electronic Signature(s) Signed: 09/20/2022 11:25:56 AM By: Yevonne Pax RN Entered By: Yevonne Pax on 09/20/2022 10:06:53 -------------------------------------------------------------------------------- Pain Assessment Details Patient Name: Date of Service: Cody Pop [name]. 09/20/2022 9:30 A M Medical Record Number: [id number] Patient Account Number: [id number] Date of Birth/Sex:  Treating RN: May 31, 1970 (51 y.o. Melonie [place] Primary Care Flonnie Wierman: SYSTEM, PCP Other Clinician: Vernona Rieger ([id number]) [id number] Page 5 of 7 Referring Sha Burling: Treating Tandi Hanko/Extender: Florentina Jenny in Treatment: 0 Active Problems Location of Pain Severity and Description of Pain Patient Has Paino Yes Site Locations With Dressing Change: Yes Duration of the Pain. Constant / Intermittento Constant Rate the pain. Current Pain Level: 7 Worst Pain Level: 10 Least Pain Level: 5 Tolerable Pain Level: 5 Character of Pain Describe the Pain: Aching, Burning Pain Management and Medication Current Pain Management: Medication: Yes Cold Application: No Rest: Yes Massage: No Activity: No NixonE.N.S.: No Heat Application: No Leg drop or elevation: No Is the Current Pain Management Adequate: Inadequate How does your wound impact your activities of daily livingo Sleep: Yes Bathing: No Appetite: No Relationship With Others: No Bladder Continence: No Emotions: No Bowel Continence: No Work: No Toileting: No Drive: No Dressing: No Hobbies: No Electronic Signature(s) Signed: 09/20/2022 11:25:56 AM By: Yevonne Pax RN Entered By: Yevonne Pax on 09/20/2022 09:52:36 -------------------------------------------------------------------------------- Patient/Caregiver Education Details Patient Name: Date of Service: [name] 4/29/2024andnbsp9:30 A M Medical Record Number: [id number] Patient Account Number: [id number] Date of Birth/Gender: Treating RN: May 30, 1970 (51 y.o. Melonie [place] Primary Care Physician: SYSTEM, PCP Other Clinician: Referring Physician: Treating Physician/Extender: Florentina Jenny in Treatment: 0 Education Assessment Education Provided To: Patient Education Topics Provided Welcome [name]-New Patient Packet: o Handouts: [name] o Methods:  Explain/Verbal Responses: State content correctly [name], [name] ([id number]) [id number] Page 6 of 7 Electronic Signature(s) Signed: 09/20/2022 11:25:56 AM By: Yevonne Pax RN Entered By: Yevonne Pax on 09/20/2022 10:07:07 -------------------------------------------------------------------------------- Wound Assessment Details Patient Name: Date of Service: [name], [name]. 09/20/2022 9:30 A M Medical Record Number: [id number] Patient Account Number: [id number] Date of Birth/Sex: Treating RN: 07-Mar-1971 (51 y.o. Judie Petit) Yevonne Pax Primary Care Leonda Cristo: SYSTEM, PCP Other Clinician: Referring Ajahnae Rathgeber: Treating Danielle Lento/Extender: Baxter Kail Weeks in Treatment: 0 Wound Status Wound Number: 1 Primary Etiology: Neuropathic Ulcer-Non Diabetic Wound Location: Right [name] Great oe Wound Status: Open Wounding Event: Blister Comorbid History: Arrhythmia Date Acquired: 08/22/2021 Weeks Of Treatment: 0 Clustered Wound: No Photos Wound Measurements Length: (cm) 0.9 Width: (cm) 0.8 Depth: (cm) 0.2 Area: (cm) 0.565 Volume: (cm) 0.113 % Reduction in Area: % Reduction in Volume: Epithelialization: None Tunneling: No Undermining: No Wound Description Classification: Full Thickness Without Exposed Support Structures Exudate Amount: Medium Exudate Type: Serosanguineous Exudate Color: red, brown Foul Odor After Cleansing: No Slough/Fibrino Yes Wound Bed Granulation Amount: Large (67-100%) Exposed Structure Granulation Quality: Red Fascia Exposed: No Necrotic Amount: Small (1-33%) Fat Layer (Subcutaneous Tissue) Exposed: Yes Necrotic Quality: Adherent Slough Tendon Exposed: No Muscle Exposed: No Joint Exposed: No Bone Exposed: No Treatment Notes Wound #1 (Toe Great) Wound Laterality: Right Cleanser Soap and Water Discharge  Instruction: Gently cleanse wound with antibacterial soap, rinse and pat dry prior to dressing wounds Wound  Cleanser Discharge Instruction: Wash your hands with soap and water. Remove old dressing, discard into plastic bag and place into trash. Cleanse the wound with Wound Cleanser prior to applying a clean dressing using gauze sponges, not tissues or cotton balls. Do not scrub or use excessive force. Pat dry using gauze sponges, not tissue or cotton balls. [name], [name] ([id number]) [id number] Page 7 of 7 Peri-Wound Care Topical Primary Dressing Hydrofera Blue Ready Transfer Foam, 2.5x2.5 (in/in) Discharge Instruction: Apply Hydrofera Blue Ready to wound bed as directed Secondary Dressing Gauze Secured With Medipore [name] - 26M Medipore H Soft Cloth Surgical [name] ape ape, 2x2 (in/yd) Compression Wrap Compression Stockings Add-Ons Electronic Signature(s) Signed: 09/20/2022 11:25:56 AM By: Yevonne Pax RN Entered By: Yevonne Pax on 09/20/2022 10:05:02 -------------------------------------------------------------------------------- Vitals Details Patient Name: Date of Service: [name], Cody Grooms [name]. 09/20/2022 9:30 A M Medical Record Number: [id number] Patient Account Number: [id number] Date of Birth/Sex: Treating RN: May 23, 1971 (51 y.o. Judie Petit) Yevonne Pax Primary Care Melville Engen: SYSTEM, PCP Other Clinician: Referring Keeshia Sanderlin: Treating Shelene Krage/Extender: Baxter Kail Weeks in Treatment: 0 Vital Signs Time Taken: 09:52 Temperature (F): 98.1 Height (in): 68 Pulse (bpm): 73 Source: Stated Respiratory Rate (breaths/min): 18 Weight (lbs): 210 Blood Pressure (mmHg): 175/83 Source: Stated Reference Range: 80 - 120 mg / dl Body Mass Index (BMI): 31.9 Electronic Signature(s) Signed: 09/20/2022 11:25:56 AM By: Yevonne Pax RN Entered By: Yevonne Pax on 09/20/2022 09:53:03

## 2022-09-20 NOTE — Progress Notes (Signed)
TRI, CHITTICK [name] ([id number]) [id number] Page 1 of 4 Visit Report for 09/20/2022 Abuse Risk Screen Details Patient Name: Date of Service: [name], [name]. 09/20/2022 9:30 A M Medical Record Number: [id number] Patient Account Number: [id number] Date of Birth/Sex: Treating RN: 1971/05/04 (51 y.o. Judie Petit) [name] Primary Care [name]: SYSTEM, PCP Other Clinician: Referring [name]: Treating [name]/Extender: [name] in Treatment: 0 Abuse Risk Screen Items Answer ABUSE RISK SCREEN: Has anyone close to you tried to hurt or harm you recentlyo No Do you feel uncomfortable with anyone in your familyo No Has anyone forced you do things that you didnt want to doo No Electronic Signature(s) Signed: 09/20/2022 11:25:56 AM By: Cody Pax RN Entered By: [name] on 09/20/2022 09:55:22 -------------------------------------------------------------------------------- Activities of Daily Living Details Patient Name: Date of Service: [name] 09/20/2022 9:30 A M Medical Record Number: [id number] Patient Account Number: [id number] Date of Birth/Sex: Treating RN: 09-01-1970 (51 y.o. Judie Petit) [name] Primary Care [name]: SYSTEM, PCP Other Clinician: Referring [name]: Treating Josclyn Rosales/Extender: Baxter Kail Weeks in Treatment: 0 Activities of Daily Living Items Answer Activities of Daily Living (Please select one for each item) Drive Automobile Completely Able [name] Medications ake Completely Able Use [name] elephone Completely Able Care for Appearance Completely Able Use [name] oilet Completely Able Bath / Shower Completely Able Dress Self Completely Able Feed Self Completely Able Walk Completely Able Get In / Out Bed Completely Able Housework Completely Able Prepare Meals Completely Able Handle Money Completely Able Shop for Self Completely Able Electronic Signature(s) Signed: 09/20/2022 11:25:56 AM By: Cody Pax RN Entered By: [name] on 09/20/2022 09:55:42 -------------------------------------------------------------------------------- Education Screening Details Patient Name: Date of Service: [name] [name], [name]. 09/20/2022 9:30 A M Medical Record Number: [id number] Patient Account Number: [id number] Date of Birth/Sex: Treating RN: 09/27/1970 (51 y.o. Melonie [place] Primary Care Kyarra Vancamp: SYSTEM, PCP Other Clinician: Referring [name]: Treating Lylith Bebeau/Extender: [name] in Treatment: 0 [place], Dannielle Huh [name] ([id number]) [id number] Page 2 of 4 Primary Learner Assessed: Patient Learning Preferences/Education Level/Primary Language Learning Preference: Explanation Highest Education Level: College or Above Preferred Language: English Cognitive Barrier Language Barrier: No Translator Needed: No Memory Deficit: No Emotional Barrier: No Cultural/Religious Beliefs Affecting Medical Care: No Physical Barrier Impaired Vision: Yes Glasses Impaired Hearing: No Decreased Hand dexterity: No Knowledge/Comprehension Knowledge Level: Medium Comprehension Level: High Ability to understand written instructions: High Ability to understand verbal instructions: High Motivation Anxiety Level: Anxious Cooperation: Cooperative Education Importance: Acknowledges Need Interest in Health Problems: Asks Questions Perception: Coherent Willingness to Engage in Self-Management High Activities: Readiness to Engage in Self-Management High Activities: Electronic Signature(s) Signed: 09/20/2022 11:25:56 AM By: Cody Pax RN Entered By: [name] on 09/20/2022 09:56:09 -------------------------------------------------------------------------------- Fall Risk Assessment Details Patient Name: Date of Service: [name] [name], [name]. 09/20/2022 9:30 A M Medical Record Number: [id number] Patient Account Number: [id number] Date of Birth/Sex: Treating  RN: 09-Mar-1971 (51 y.o. Judie Petit) [name] Primary Care [name]: SYSTEM, PCP Other Clinician: Referring [name]: Treating [name]/Extender: [name] in Treatment: 0 Fall Risk Assessment Items Have you had 2 or more falls in the last 12 monthso 0 No Have you had any fall that resulted in injury in the last 12 monthso 0 No FALLS RISK SCREEN History of falling - immediate or within 3 months 0 No Secondary diagnosis (Do you have 2 or more medical diagnoseso) 0 No Ambulatory aid None/bed rest/wheelchair/nurse 0 Yes Crutches/cane/walker 0 No  Furniture 0 No Intravenous therapy Access/Saline/Heparin Lock 0 No Gait/Transferring Normal/ bed rest/ wheelchair 0 Yes Weak (short steps with or without shuffle, stooped but able to lift head while walking, may seek 0 No support from furniture) Impaired (short steps with shuffle, may have difficulty arising from chair, head down, impaired 0 No balance) Mental Status Oriented to own ability 0 Yes Overestimates or forgets limitations 0 No Risk Level: Low Risk Score: 0 [name], [name] ([id number]) [id number] Page 3 of 4 Electronic Signature(s) -------------------------------------------------------------------------------- Foot Assessment Details Patient Name: Date of Service: [name], [name]. 09/20/2022 9:30 A M Medical Record Number: [id number] Patient Account Number: [id number] Date of Birth/Sex: Treating RN: 23-Jan-1971 (51 y.o. Judie Petit) [name] Primary Care Kylyn Mcdade: SYSTEM, PCP Other Clinician: Referring [name]: Treating [name] [place]/Extender: Baxter Kail Weeks in Treatment: 0 Foot Assessment Items Site Locations + = Sensation present, - = Sensation absent, C = Callus, U = Ulcer R = Redness, W = Warmth, M = Maceration, PU = Pre-ulcerative lesion F = Fissure, S = Swelling, D = Dryness Assessment Right: Left: Other Deformity: No No Prior Foot Ulcer: No No Prior  Amputation: No No Charcot Joint: No No Ambulatory Status: Ambulatory Without Help Gait: Electronic Signature(s) Signed: 09/20/2022 11:25:56 AM By: Cody Pax RN Entered By: [name] on 09/20/2022 10:02:48 -------------------------------------------------------------------------------- Nutrition Risk Screening Details Patient Name: Date of Service: [name] 09/20/2022 9:30 A M Medical Record Number: [id number] Patient Account Number: [id number] Date of Birth/Sex: Treating RN: 1970/12/05 (51 y.o. Judie Petit) [name] Primary Care Verline Kong: SYSTEM, PCP Other Clinician: Referring Kirbie Stodghill: Treating Lashai Grosch/Extender: Baxter Kail Weeks in Treatment: 0 Height (in): 68 Weight (lbs): 210 Body Mass Index (BMI): 31.9 KYLO, GAVIN [name] ([id number]) [id number].pdf Page 4 of 4 Nutrition Risk Screening Items Score Screening NUTRITION RISK SCREEN: I have an illness or condition that made me change the kind and/or amount of food I eat 0 No I eat fewer than two meals per day 0 No I eat few fruits and vegetables, or milk products 0 No I have three or more drinks of beer, liquor or wine almost every day 0 No I have tooth or mouth problems that make it hard for me to eat 0 No I don'[name] always have enough money to buy the food I need 0 No I eat alone most of the time 0 No I take three or more different prescribed or over-the-counter drugs a day 1 Yes Without wanting to, I have lost or gained 10 pounds in the last six months 0 No I am not always physically able to shop, cook and/or feed myself 0 No Nutrition Protocols Good Risk Protocol 0 No interventions needed Moderate Risk Protocol High Risk Proctocol Risk Level: Good Risk Score: 1 Electronic Signature(s) Signed: 09/20/2022 11:25:56 AM By: Cody Pax RN Entered By: [name] on 09/20/2022 09:56:29

## 2022-09-21 NOTE — Progress Notes (Signed)
KELDAN, EPLIN T ([id number]) [id number] Page 1 of 7 Visit Report for 09/20/2022 Chief Complaint Document Details Patient Name: Date of Service: [place], [place] T. 09/20/2022 9:30 A M Medical Record Number: [id number] Patient Account Number: [id number] Date of Birth/Sex: Treating RN: 11-13-1970 (51 y.o. Judie Petit) Yevonne Pax Primary Care Provider: SYSTEM, PCP Other Clinician: Referring Provider: Treating Provider/Extender: Florentina Jenny in Treatment: 0 Information Obtained from: Patient Chief Complaint Right foot ulcer Electronic Signature(s) Signed: 09/20/2022 10:21:12 AM By: Allen Derry PA-C Entered By: Allen Derry on 09/20/2022 10:21:11 -------------------------------------------------------------------------------- Debridement Details Patient Name: Date of Service: [place], [place] T. 09/20/2022 9:30 A M Medical Record Number: [id number] Patient Account Number: [id number] Date of Birth/Sex: Treating RN: 07/14/1970 (51 y.o. Judie Petit) Yevonne Pax Primary Care Provider: SYSTEM, PCP Other Clinician: Referring Provider: Treating Provider/Extender: Florentina Jenny in Treatment: 0 Debridement Performed for Assessment: Wound #1 Right T Great oe Performed By: Physician Allen Derry, PA-C Debridement Type: Debridement Level of Consciousness (Pre-procedure): Awake and Alert Pre-procedure Verification/Time Out Yes - 10:25 Taken: Start Time: 10:25 Percent of Wound Bed Debrided: 100% T Area Debrided (cm): otal 0.57 Tissue and other material debrided: Callus, Slough, Subcutaneous, Slough Level: Skin/Subcutaneous Tissue Debridement Description: Excisional Instrument: Curette Bleeding: Minimum Hemostasis Achieved: Pressure End Time: 10:29 Procedural Pain: 0 Post Procedural Pain: 0 Response to Treatment: Procedure was tolerated well Level of Consciousness (Post- Awake and Alert procedure): Post Debridement Measurements of Total  Wound Length: (cm) 0.9 Width: (cm) 0.8 Depth: (cm) 0.2 Volume: (cm) 0.113 Character of Wound/Ulcer Post Debridement: Improved Post Procedure Diagnosis Same as Pre-procedure Electronic Signature(s) Signed: 09/20/2022 11:25:56 AM By: Yevonne Pax RN Signed: 09/20/2022 6:40:35 PM By: Allen Derry PA-C Entered By: Yevonne Pax on 09/20/2022 10:28:17 Vernona Rieger ([id number]) [id number] Page 2 of 7 -------------------------------------------------------------------------------- HPI Details Patient Name: Date of Service: [place], [place] T. 09/20/2022 9:30 A M Medical Record Number: [id number] Patient Account Number: [id number] Date of Birth/Sex: Treating RN: 04-26-1971 (51 y.o. Judie Petit) Yevonne Pax Primary Care Provider: SYSTEM, PCP Other Clinician: Referring Provider: Treating Provider/Extender: Florentina Jenny in Treatment: 0 History of Present Illness HPI Description: 09-20-2022 upon evaluation today patient presents for an initial evaluation concerning wound on the right great toe which occurred on 08-22-2021 as best he can remember. He tells me that this has apparently been rubbing as far as his shoe is concerned and because the wound here. Being not neuropathic he does not necessarily notice everything that occurs and that is a big part of the issue as well. Fortunately I do not think that this looks to be too severe but nonetheless it is good to be something that we need to manage appropriately and quickly in order to try to get this healed as quickly as possible. The patient voiced understanding. Depending on how we progress if he is not able to get this healed with the use of his current shoe because he has to have an AFO the only option would be a cast but if he is in a cast he is not to be able to drive he tells me that is very hard because he has to work. Nonetheless in the end we will discuss this as things progressed but if he is noticing  signs of improvement we may not even have to consider the cast. Patient does have a history of alcohol abuse, atrial fibrillation, and neuropathy but is not diabetic. Electronic Signature(s) Signed: 09/22/2022 12:35:49 PM By: Allen Derry  PA-C Entered By: Allen Derry on 09/22/2022 12:35:49 -------------------------------------------------------------------------------- Physical Exam Details Patient Name: Date of Service: Edd Fabian 09/20/2022 9:30 A M Medical Record Number: [id number] Patient Account Number: [id number] Date of Birth/Sex: Treating RN: [birth year] (51 y.o. Judie Petit) Yevonne Pax Primary Care Provider: SYSTEM, PCP Other Clinician: Referring Provider: Treating Provider/Extender: Florentina Jenny in Treatment: 0 Constitutional patient is hypertensive.. pulse regular and within target range for patient.Marland Kitchen respirations regular, non-labored and within target range for patient.Marland Kitchen temperature within target range for patient.. Well-nourished and well-hydrated in no acute distress. Eyes conjunctiva clear no eyelid edema noted. pupils equal round and reactive to light and accommodation. Ears, Nose, Mouth, and Throat no gross abnormality of ear auricles or external auditory canals. normal hearing noted during conversation. mucus membranes moist. Respiratory normal breathing without difficulty. Cardiovascular 2+ dorsalis pedis/posterior tibialis pulses. no clubbing, cyanosis, significant edema, <3 sec cap refill. Musculoskeletal normal gait and posture. no significant deformity or arthritic changes, no loss or range of motion, no clubbing. Psychiatric this patient is able to make decisions and demonstrates good insight into disease process. Alert and Oriented x 3. pleasant and cooperative. Notes Upon inspection patient's wound bed actually showed signs of the need for sharp debridement I did perform debridement today clearway the necrotic debris he tolerated that today  without complication and postdebridement wound bed appears to be doing much better which is great news. Patient does wear an AFO on the right foot due to chronic back issues this is going to complicate the situation a bit as we cannot really get him an offloading shoe. Electronic Signature(s) Signed: 09/22/2022 12:36:23 PM By: Allen Derry PA-C Entered By: Allen Derry on 09/22/2022 12:36:23 Physician Orders Details -------------------------------------------------------------------------------- Vernona Rieger ([id number]) [id number] Page 3 of 7 Patient Name: Date of Service: [place], [place] T. 09/20/2022 9:30 A M Medical Record Number: [id number] Patient Account Number: [id number] Date of Birth/Sex: Treating RN: [birth year] (51 y.o. 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 Bathing/ Shower/ Hygiene May shower with wound dressing protected with water repellent cover or cast protector. Anesthetic (Use 'Patient Medications' Section for Anesthetic Order Entry) Lidocaine applied to wound bed Edema Control - Lymphedema / Segmental Compressive Device / Other Elevate, Exercise Daily and A void Standing for Long Periods of Time. Elevate legs to the level of the heart and pump ankles as often as possible Elevate leg(s) parallel to the floor when sitting. Wound Treatment Wound #1 - T Great oe Wound Laterality: Right Cleanser: Soap and Water 1 x Per Day/30 Days Discharge Instructions: Gently cleanse wound  with antibacterial soap, rinse and pat dry prior to dressing wounds Cleanser: Wound Cleanser 1 x Per Day/30 Days Discharge Instructions: Wash your hands with soap and water. Remove old dressing, discard into plastic bag and place into trash. Cleanse the wound with Wound Cleanser prior to applying a clean dressing using gauze sponges, not tissues or cotton balls. Do not scrub or use excessive force. Pat dry using gauze sponges, not tissue or cotton balls. Prim Dressing: Hydrofera Blue Ready Transfer Foam, 2.5x2.5 (in/in) 1 x Per Day/30 Days ary Discharge Instructions: Apply  Hydrofera Blue Ready to wound bed as directed Secondary Dressing: Gauze 1 x Per Day/30 Days Secured With: Medipore T - 24M Medipore H Soft Cloth Surgical T ape ape, 2x2 (in/yd) 1 x Per Day/30 Days Electronic Signature(s) Signed: 09/20/2022 11:25:56 AM By: Yevonne Pax RN Signed: 09/20/2022 6:40:35 PM By: Allen Derry PA-C Entered By: Yevonne Pax on 09/20/2022 10:47:34 -------------------------------------------------------------------------------- Problem List Details Patient Name: Date of Service: [place], [place] T. 09/20/2022 9:30 A M Medical Record Number: [id number] Patient Account Number: [id number] Date of Birth/Sex: Treating RN: May 18, 1971 (51 y.o. 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 09/20/2022 No Yes CLEBURN, MAIOLO T ([id number]) [id number] Page 4 of 7 L97.512 Non-pressure chronic ulcer of other part of right foot with fat layer exposed 09/20/2022 No Yes I48.0 Paroxysmal atrial fibrillation 09/20/2022 No Yes F10.188 Alcohol abuse with other alcohol-induced disorder 09/20/2022 No Yes Inactive Problems Resolved Problems Electronic  Signature(s) Signed: 09/20/2022 10:20:47 AM By: Allen Derry PA-C Entered By: Allen Derry on 09/20/2022 10:20:46 -------------------------------------------------------------------------------- Progress Note Details Patient Name: Date of Service: [place], [place] T. 09/20/2022 9:30 A M Medical Record Number: [id number] Patient Account Number: [id number] Date of Birth/Sex: Treating RN: [birth year] (51 y.o. Judie Petit) Yevonne Pax Primary Care Provider: SYSTEM, PCP Other Clinician: Referring Provider: Treating Provider/Extender: Florentina Jenny in Treatment: 0 Subjective Chief Complaint Information obtained from Patient Right foot ulcer History of Present Illness (HPI) 09-20-2022 upon evaluation today patient presents for an initial evaluation concerning wound on the right great toe which occurred on 08-22-2021 as best he can remember. He tells me that this has apparently been rubbing as far as his shoe is concerned and because the wound here. Being not neuropathic he does not necessarily notice everything that occurs and that is a big part of the issue as well. Fortunately I do not think that this looks to be too severe but nonetheless it is good to be something that we need to manage appropriately and quickly in order to try to get this healed as quickly as possible. The patient voiced understanding. Depending on how we progress if he is not able to get this healed with the use of his current shoe because he has to have an AFO the only option would be a cast but if he is in a cast he is not to be able to drive he tells me that is very hard because he has to work. Nonetheless in the end we will discuss this as things progressed but if he is noticing signs of improvement we may not even have to consider the cast. Patient does have a history of alcohol abuse, atrial fibrillation, and neuropathy but is not diabetic. Patient History Allergies No Known Allergies Social History Former smoker,  Marital Status - Divorced, Alcohol Use - Moderate, Drug Use - Prior History, Caffeine Use - Moderate. Medical History Cardiovascular Patient has history of Arrhythmia - a fib Objective Constitutional patient is hypertensive.. pulse regular and within target range for patient.Marland Kitchen respirations regular, non-labored and within target range for patient.Marland Kitchen temperature within target range for patient.. Well-nourished and well-hydrated in no acute distress. JYREN, CERASOLI T ([id number]) [id number] Page 5 of 7 Vitals Time Taken: 9:52 AM, Height: 68 in, Source: Stated, Weight: 210 lbs, Source: Stated, BMI: 31.9, Temperature: 98.1 F, Pulse: 73 bpm, Respiratory Rate: 18 breaths/min,  Blood Pressure: 175/83 mmHg. Eyes conjunctiva clear no eyelid edema noted. pupils equal round and reactive to light and accommodation. Ears, Nose, Mouth, and Throat no gross abnormality of ear auricles or external auditory canals. normal hearing noted during conversation. mucus membranes moist. Respiratory normal breathing without difficulty. Cardiovascular 2+ dorsalis pedis/posterior tibialis pulses. no clubbing, cyanosis, significant edema, Musculoskeletal normal gait and posture. no significant deformity or arthritic changes, no loss or range of motion, no clubbing. Psychiatric this patient is able to make decisions and demonstrates good insight into disease process. Alert and Oriented x 3. pleasant and cooperative. General Notes: Upon inspection patient's wound bed actually showed signs of the need for sharp debridement I did perform debridement today clearway the necrotic debris he tolerated that today without complication and postdebridement wound bed appears to be doing much better which is great news. Patient does wear an AFO on the right foot due to chronic back issues this is going to complicate the situation a bit as we cannot really get him an offloading shoe. Integumentary (Hair,  Skin) Wound #1 status is Open. Original cause of wound was Blister. The date acquired was: 08/22/2021. The wound is located on the Right T Great. The wound oe measures 0.9cm length x 0.8cm width x 0.2cm depth; 0.565cm^2 area and 0.113cm^3 volume. There is Fat Layer (Subcutaneous Tissue) exposed. There is no tunneling or undermining noted. There is a medium amount of serosanguineous drainage noted. There is large (67-100%) red granulation within the wound bed. There is a small (1-33%) amount of necrotic tissue within the wound bed including Adherent Slough. 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 There was a Excisional Skin/Subcutaneous Tissue oe Debridement with a total area of 0.57 sq cm performed by Allen Derry, PA-C. With the following instrument(s): Curette Material removed includes Callus, Subcutaneous Tissue, and Slough. No specimens were taken. A time out was conducted at 10:25, prior to the start of the procedure. A Minimum amount of bleeding was controlled with Pressure. The procedure was tolerated well with a pain level of 0 throughout and a pain level of 0 following the procedure. Post Debridement Measurements: 0.9cm length x 0.8cm width x 0.2cm depth; 0.113cm^3 volume. Character of Wound/Ulcer Post Debridement is improved. Post procedure Diagnosis Wound #1: Same as Pre-Procedure Plan Follow-up Appointments: Return Appointment in 1 week. Bathing/ Shower/ Hygiene: May shower with wound dressing protected with water repellent cover or cast protector. Anesthetic (Use 'Patient Medications' Section for Anesthetic Order Entry): Lidocaine applied to wound bed Edema Control -  Lymphedema / Segmental Compressive Device / Other: Elevate, Exercise Daily and Avoid Standing for Long Periods of Time. Elevate legs to the level of the heart and pump ankles as often as possible Elevate leg(s) parallel to the floor when sitting. WOUND #1: - T Great Wound Laterality: Right oe Cleanser: Soap and Water 1 x Per Day/30 Days Discharge Instructions: Gently cleanse wound with antibacterial soap, rinse and pat dry prior to dressing wounds Cleanser: Wound Cleanser 1 x Per Day/30 Days Discharge Instructions: Wash your hands with soap and water. Remove old dressing, discard into plastic bag and place into trash. Cleanse the wound with Wound Cleanser prior to applying a clean dressing using gauze sponges, not tissues or cotton balls. Do not scrub  or use excessive force. Pat dry using gauze sponges, not tissue or cotton balls. Prim Dressing: Hydrofera Blue Ready Transfer Foam, 2.5x2.5 (in/in) 1 x Per Day/30 Days ary Discharge Instructions: Apply Hydrofera Blue Ready to wound bed as directed NIGEL, ERICSSON T ([id number]) [id number] Page 6 of 7 Secondary Dressing: Gauze 1 x Per Day/30 Days Secured With: Medipore T - 13M Medipore H Soft Cloth Surgical T ape ape, 2x2 (in/yd) 1 x Per Day/30 Days 1. Would recommend currently that we have the patient continue to monitor for any signs of infection or worsening. Based on what I am seeing I do believe that the patient is making pretty good progress here. With that being said the biggest issue is going to be getting him offloaded appropriately while still preventing any issues with the foot drop causing tripping or otherwise issues. The patient voiced understanding. With that being said I am going to go ahead and see about getting him started with the [facility] currently. Working to secure this with tape. 2. I am good recommend as well he continue with his AFO for now if were not seeing improvements the neck step would  be for [place] to look into getting him into a total contact cast he is aware of this although he would like to try to avoid this if at all possible. We will see patient back for reevaluation in 1 week here in the clinic. If anything worsens or changes patient will contact our office for additional recommendations. Electronic Signature(s) Signed: 09/22/2022 12:37:30 PM By: Allen Derry PA-C Entered By: Allen Derry on 09/22/2022 12:37:29 -------------------------------------------------------------------------------- ROS/PFSH Details Patient Name: Date of Service: [place], [place] T. 09/20/2022 9:30 A M Medical Record Number: [id number] Patient Account Number: [id number] Date of Birth/Sex: Treating RN: 30-Jul-1970 (51 y.o. Judie Petit) Yevonne Pax Primary Care Provider: SYSTEM, PCP Other Clinician: Referring Provider: Treating Provider/Extender: Florentina Jenny in Treatment: 0 Cardiovascular Medical History: Positive for: Arrhythmia - a fib Immunizations Pneumococcal Vaccine: Received Pneumococcal Vaccination: No Implantable Devices None Family and Social History Former smoker; Marital Status - Divorced; Alcohol Use: Moderate; Drug Use: Prior History; Caffeine Use: Moderate Electronic Signature(s) Signed: 09/20/2022 11:25:56 AM By: Yevonne Pax RN Signed: 09/20/2022 6:40:35 PM By: Allen Derry PA-C Entered By: Yevonne Pax on 09/20/2022 09:55:16 -------------------------------------------------------------------------------- SuperBill Details Patient Name: Date of Service: [place] T. 09/20/2022 Medical Record Number: [id number] Patient Account Number: [id number] Date of Birth/Sex: Treating RN: Jun 30, 1970 (51 y.o. 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 L97.512  Non-pressure chronic ulcer of other part of right foot with fat layer exposed I48.0 Paroxysmal atrial fibrillation F10.188 Alcohol abuse with other alcohol-induced disorder CHELSEY, KIMBERLEY ([id number]) [id number] Page 7 of 7 Facility Procedures : CPT4 Code: 44010272 Description: 99213 - WOUND CARE VISIT-LEV 3 EST PT Modifier: Quantity: 1 : CPT4 Code: 53664403 Description: 11042 - DEB SUBQ TISSUE 20 SQ CM/< ICD-10 Diagnosis Description L97.512 Non-pressure chronic ulcer of other part of right foot with fat layer exposed Modifier: Quantity: 1 Physician Procedures : CPT4 Code Description Modifier [id number] WC PHYS LEVEL 3 NEW PT 25 ICD-10 Diagnosis Description 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

## 2022-09-27 ENCOUNTER — Ambulatory Visit: Payer: Medicaid Other | Admitting: Physician Assistant

## 2022-09-30 ENCOUNTER — Ambulatory Visit: Payer: Medicaid Other | Admitting: Physician Assistant

## 2022-10-11 ENCOUNTER — Encounter: Payer: Medicaid Other | Attending: Physician Assistant | Admitting: Physician Assistant

## 2022-10-11 DIAGNOSIS — G603 Idiopathic progressive neuropathy: Secondary | ICD-10-CM | POA: Diagnosis present

## 2022-10-11 DIAGNOSIS — I480 Paroxysmal atrial fibrillation: Secondary | ICD-10-CM | POA: Diagnosis not present

## 2022-10-11 DIAGNOSIS — L97512 Non-pressure chronic ulcer of other part of right foot with fat layer exposed: Secondary | ICD-10-CM | POA: Insufficient documentation

## 2022-10-11 NOTE — Progress Notes (Signed)
ABDIKADIR, WHYBREW ([id number]) [id number] Page 1 of 1 Visit Report for 10/11/2022 Chief Complaint Document Details Patient Name: Date of Service: [place], [place] T. 10/11/2022 8:00 A M Medical Record Number: [id number] Patient Account Number: [id number] Date of Birth/Sex: Treating RN: 02/18/1971 (51 y.o. [name]) Yevonne Pax Primary Care Provider: SYSTEM, PCP Other Clinician: Referring Provider: Treating Provider/Extender: Florentina Jenny in Treatment: 3 Information Obtained from: Patient Chief Complaint Right foot ulcer Electronic Signature(s) Signed: 10/11/2022 8:11:13 AM By: Allen Derry PA-C Entered By: Allen Derry on 10/11/2022 08:11:13 -------------------------------------------------------------------------------- Problem List Details Patient Name: Date of Service: [place], [place] T. 10/11/2022 8:00 A M Medical Record Number: [id number] Patient Account Number: [id number] Date of Birth/Sex: Treating RN: [birth year] (51 y.o. 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

## 2022-10-20 ENCOUNTER — Other Ambulatory Visit: Payer: Medicaid Other

## 2022-10-21 ENCOUNTER — Other Ambulatory Visit: Payer: Self-pay

## 2022-10-21 ENCOUNTER — Encounter: Payer: Medicaid Other | Admitting: Physician Assistant

## 2022-10-21 DIAGNOSIS — R6882 Decreased libido: Secondary | ICD-10-CM

## 2022-10-21 DIAGNOSIS — G603 Idiopathic progressive neuropathy: Secondary | ICD-10-CM | POA: Diagnosis not present

## 2022-10-21 DIAGNOSIS — R5383 Other fatigue: Secondary | ICD-10-CM

## 2022-10-22 ENCOUNTER — Encounter: Payer: Self-pay | Admitting: Podiatry

## 2022-10-22 ENCOUNTER — Ambulatory Visit (INDEPENDENT_AMBULATORY_CARE_PROVIDER_SITE_OTHER): Payer: Medicaid Other

## 2022-10-22 ENCOUNTER — Telehealth: Payer: Self-pay | Admitting: *Deleted

## 2022-10-22 ENCOUNTER — Ambulatory Visit: Payer: Medicaid Other | Admitting: Podiatry

## 2022-10-22 DIAGNOSIS — L97512 Non-pressure chronic ulcer of other part of right foot with fat layer exposed: Secondary | ICD-10-CM

## 2022-10-22 DIAGNOSIS — M7671 Peroneal tendinitis, right leg: Secondary | ICD-10-CM | POA: Diagnosis not present

## 2022-10-22 DIAGNOSIS — M7052 Other bursitis of knee, left knee: Secondary | ICD-10-CM

## 2022-10-22 DIAGNOSIS — M76821 Posterior tibial tendinitis, right leg: Secondary | ICD-10-CM

## 2022-10-22 DIAGNOSIS — S93401A Sprain of unspecified ligament of right ankle, initial encounter: Secondary | ICD-10-CM

## 2022-10-22 MED ORDER — BETAMETHASONE SOD PHOS & ACET 6 (3-3) MG/ML IJ SUSP
3.0000 mg | Freq: Once | INTRAMUSCULAR | Status: AC
Start: 2022-10-22 — End: 2022-10-22
  Administered 2022-10-22: 3 mg via INTRA_ARTICULAR

## 2022-10-22 NOTE — Telephone Encounter (Signed)
Referral has been faxed to [facility] in [place].

## 2022-10-22 NOTE — Telephone Encounter (Signed)
-----   Message from Felecia Shelling, DPM sent at 10/22/2022  9:26 AM EDT ----- Regarding: Referral to [facility] orthopedics in [place] I just placed a referral to [facility] orthopedics in [place].  Could we please reach out to them and fax the referral to them? Thanks, Dr. Logan Bores
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
## 2022-10-26 ENCOUNTER — Other Ambulatory Visit: Payer: Medicaid Other

## 2022-10-28 ENCOUNTER — Ambulatory Visit: Payer: Medicaid Other | Admitting: Physician Assistant

## 2022-10-29 ENCOUNTER — Other Ambulatory Visit: Payer: Medicaid Other

## 2022-10-29 DIAGNOSIS — R6882 Decreased libido: Secondary | ICD-10-CM

## 2022-10-29 DIAGNOSIS — R5383 Other fatigue: Secondary | ICD-10-CM

## 2022-10-30 LAB — TESTOSTERONE: Testosterone: 271 ng/dL (ref 264–916)

## 2022-10-31 ENCOUNTER — Encounter: Payer: Self-pay | Admitting: Urology

## 2022-11-02 ENCOUNTER — Other Ambulatory Visit: Payer: Self-pay | Admitting: Urology

## 2022-11-02 MED ORDER — TESTOSTERONE 20.25 MG/ACT (1.62%) TD GEL: TRANSDERMAL | 1 refills | Status: DC

## 2022-11-04 ENCOUNTER — Emergency Department
Admission: EM | Admit: 2022-11-04 | Discharge: 2022-11-04 | Disposition: A | Discharge status: Home / Self Care | Payer: Medicaid Other | Attending: Emergency Medicine | Admitting: Emergency Medicine

## 2022-11-04 ENCOUNTER — Other Ambulatory Visit: Payer: Self-pay

## 2022-11-04 ENCOUNTER — Emergency Department: Payer: Medicaid Other

## 2022-11-04 DIAGNOSIS — Z79899 Other long term (current) drug therapy: Secondary | ICD-10-CM | POA: Insufficient documentation

## 2022-11-04 DIAGNOSIS — S2242XA Multiple fractures of ribs, left side, initial encounter for closed fracture: Secondary | ICD-10-CM | POA: Insufficient documentation

## 2022-11-04 DIAGNOSIS — S2222XA Fracture of body of sternum, initial encounter for closed fracture: Secondary | ICD-10-CM | POA: Diagnosis not present

## 2022-11-04 DIAGNOSIS — S61412A Laceration without foreign body of left hand, initial encounter: Secondary | ICD-10-CM | POA: Diagnosis not present

## 2022-11-04 DIAGNOSIS — Y92410 Unspecified street and highway as the place of occurrence of the external cause: Secondary | ICD-10-CM | POA: Diagnosis not present

## 2022-11-04 DIAGNOSIS — R109 Unspecified abdominal pain: Secondary | ICD-10-CM | POA: Insufficient documentation

## 2022-11-04 DIAGNOSIS — S0121XA Laceration without foreign body of nose, initial encounter: Secondary | ICD-10-CM | POA: Diagnosis not present

## 2022-11-04 DIAGNOSIS — S0990XA Unspecified injury of head, initial encounter: Secondary | ICD-10-CM | POA: Insufficient documentation

## 2022-11-04 DIAGNOSIS — S299XXA Unspecified injury of thorax, initial encounter: Secondary | ICD-10-CM | POA: Diagnosis present

## 2022-11-04 LAB — CBC WITH DIFFERENTIAL/PLATELET
Abs Immature Granulocytes: 0.11 10*3/uL — ABNORMAL HIGH (ref 0.00–0.07)
Basophils Absolute: 0.1 10*3/uL (ref 0.0–0.1)
Basophils Relative: 1 %
Eosinophils Absolute: 0.2 10*3/uL (ref 0.0–0.5)
Eosinophils Relative: 2 %
HCT: 44 % (ref 39.0–52.0)
Hemoglobin: 14.2 g/dL (ref 13.0–17.0)
Immature Granulocytes: 1 %
Lymphocytes Relative: 13 %
Lymphs Abs: 1.3 10*3/uL (ref 0.7–4.0)
MCH: 32.3 pg (ref 26.0–34.0)
MCHC: 32.3 g/dL (ref 30.0–36.0)
MCV: 100 fL (ref 80.0–100.0)
Monocytes Absolute: 1.1 10*3/uL — ABNORMAL HIGH (ref 0.1–1.0)
Monocytes Relative: 12 %
Neutro Abs: 7 10*3/uL (ref 1.7–7.7)
Neutrophils Relative %: 71 %
Platelets: 134 10*3/uL — ABNORMAL LOW (ref 150–400)
RBC: 4.4 MIL/uL (ref 4.22–5.81)
RDW: 13.6 % (ref 11.5–15.5)
WBC: 9.9 10*3/uL (ref 4.0–10.5)
nRBC: 0 % (ref 0.0–0.2)

## 2022-11-04 LAB — URINALYSIS, ROUTINE W REFLEX MICROSCOPIC
Bilirubin Urine: NEGATIVE
Glucose, UA: NEGATIVE mg/dL
Hgb urine dipstick: NEGATIVE
Ketones, ur: NEGATIVE mg/dL
Leukocytes,Ua: NEGATIVE
Nitrite: NEGATIVE
Protein, ur: NEGATIVE mg/dL
Specific Gravity, Urine: >1.046 — ABNORMAL HIGH (ref 1.005–1.030)
pH: 5 (ref 5.0–8.0)

## 2022-11-04 LAB — COMPREHENSIVE METABOLIC PANEL
ALT: 44 U/L (ref 0–44)
AST: 77 U/L — ABNORMAL HIGH (ref 15–41)
Albumin: 4.2 g/dL (ref 3.5–5.0)
Alkaline Phosphatase: 99 U/L (ref 38–126)
Anion gap: 12 (ref 5–15)
BUN: 20 mg/dL (ref 6–20)
CO2: 25 mmol/L (ref 22–32)
Calcium: 9.4 mg/dL (ref 8.9–10.3)
Chloride: 102 mmol/L (ref 98–111)
Creatinine, Ser: 0.9 mg/dL (ref 0.61–1.24)
GFR, Estimated: >60 mL/min (ref >60)
Glucose, Bld: 109 mg/dL — ABNORMAL HIGH (ref 70–99)
Potassium: 3.6 mmol/L (ref 3.5–5.1)
Sodium: 139 mmol/L (ref 135–145)
Total Bilirubin: 2.2 mg/dL — ABNORMAL HIGH (ref 0.3–1.2)
Total Protein: 7.4 g/dL (ref 6.5–8.1)

## 2022-11-04 LAB — TYPE AND SCREEN
ABO/RH(D): O POS
Antibody Screen: NEGATIVE

## 2022-11-04 LAB — TROPONIN I (HIGH SENSITIVITY)
Troponin I (High Sensitivity): 18 ng/L — ABNORMAL HIGH (ref <18)
Troponin I (High Sensitivity): 22 ng/L — ABNORMAL HIGH (ref <18)

## 2022-11-04 LAB — URINE DRUG SCREEN, QUALITATIVE (ARMC ONLY)
Amphetamines, Ur Screen: POSITIVE — AB
Barbiturates, Ur Screen: NOT DETECTED
Benzodiazepine, Ur Scrn: NOT DETECTED
Cannabinoid 50 Ng, Ur ~~LOC~~: NOT DETECTED
Cocaine Metabolite,Ur ~~LOC~~: NOT DETECTED
MDMA (Ecstasy)Ur Screen: NOT DETECTED
Methadone Scn, Ur: NOT DETECTED
Opiate, Ur Screen: POSITIVE — AB
Phencyclidine (PCP) Ur S: NOT DETECTED
Tricyclic, Ur Screen: NOT DETECTED

## 2022-11-04 LAB — PROTIME-INR
INR: 1.2 (ref 0.8–1.2)
Prothrombin Time: 15.3 seconds — ABNORMAL HIGH (ref 11.4–15.2)

## 2022-11-04 LAB — CBG MONITORING, ED: Glucose-Capillary: 106 mg/dL — ABNORMAL HIGH (ref 70–99)

## 2022-11-04 LAB — ETHANOL: Alcohol, Ethyl (B): <10 mg/dL (ref <10)

## 2022-11-04 MED ORDER — IOHEXOL 300 MG/ML  SOLN
100.0000 mL | Freq: Once | INTRAMUSCULAR | Status: AC | PRN
  Administered 2022-11-04: 100 mL via INTRAVENOUS

## 2022-11-04 MED ORDER — MELOXICAM 15 MG PO TABS: 15.0000 mg | ORAL_TABLET | Freq: Every day | ORAL | 0 refills | Status: DC

## 2022-11-04 MED ORDER — SODIUM CHLORIDE 0.9 % IV BOLUS
1000.0000 mL | Freq: Once | INTRAVENOUS | Status: AC
  Administered 2022-11-04: 1000 mL via INTRAVENOUS

## 2022-11-04 MED ORDER — CEPHALEXIN 500 MG PO CAPS: 1000.0000 mg | ORAL_CAPSULE | Freq: Two times a day (BID) | ORAL | 0 refills | Status: DC

## 2022-11-04 NOTE — ED Notes (Signed)
PD at bedside.

## 2022-11-04 NOTE — ED Notes (Signed)
Pt. Rousable only to pain.

## 2022-11-04 NOTE — ED Notes (Signed)
Pt. In CT. 

## 2022-11-04 NOTE — ED Notes (Signed)
Placed on 2L [place] to maintain SpO2 >92%
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
## 2022-11-04 NOTE — ED Notes (Signed)
Pt. O2 sats dropped to 82% on RA, pt. Placed on 2L [place].
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
## 2022-12-07 ENCOUNTER — Encounter: Payer: Self-pay | Admitting: Podiatry

## 2022-12-08 ENCOUNTER — Other Ambulatory Visit: Payer: Medicaid Other

## 2022-12-17 ENCOUNTER — Other Ambulatory Visit: Payer: Self-pay

## 2022-12-17 DIAGNOSIS — R6882 Decreased libido: Secondary | ICD-10-CM

## 2022-12-20 ENCOUNTER — Other Ambulatory Visit: Payer: Medicaid Other

## 2022-12-20 DIAGNOSIS — R6882 Decreased libido: Secondary | ICD-10-CM

## 2022-12-22 ENCOUNTER — Telehealth: Payer: Self-pay

## 2022-12-22 NOTE — Telephone Encounter (Signed)
Patient left message stating he saw his Testosterone level is higher now since going up to 4 pumps daily on the medication. He would like to know if he should decrease that amount?

## 2022-12-23 ENCOUNTER — Other Ambulatory Visit: Payer: Self-pay

## 2022-12-23 DIAGNOSIS — E291 Testicular hypofunction: Secondary | ICD-10-CM

## 2022-12-23 DIAGNOSIS — R6882 Decreased libido: Secondary | ICD-10-CM

## 2022-12-23 MED ORDER — TESTOSTERONE 20.25 MG/ACT (1.62%) TD GEL: TRANSDERMAL | Status: DC

## 2022-12-23 NOTE — Telephone Encounter (Signed)
Would decrease to 3 pumps daily

## 2022-12-23 NOTE — Telephone Encounter (Signed)
Patient advised. RX updated in the chart -updated only not refilled

## 2022-12-23 NOTE — Addendum Note (Signed)
Addended by: Consuella Lose on: 12/23/2022 03:43 PM   Modules accepted: Orders

## 2022-12-28 ENCOUNTER — Other Ambulatory Visit: Payer: Self-pay | Admitting: Urology

## 2022-12-28 NOTE — Progress Notes (Signed)
RAMIER, BELLERIVE [name] ([id number]) [id number] Page 1 of 7 Visit Report for 10/21/2022 Arrival Information Details Patient Name: Date of Service: [name], [place] [name]. 10/21/2022 8:00 A M Medical Record Number: [id number] Patient Account Number: [id number] Date of Birth/Sex: Treating RN: 10-Apr-1971 (51 y.o. [name]) [name] Primary Care [name]: SYSTEM, PCP Other Clinician: Referring [name]: Treating [name]/Extender: [name] in [name]: 4 Visit Information History Since Last Visit Added or deleted any medications: No Patient Arrived: Ambulatory Any Cody allergies or adverse reactions: No Arrival Time: 08:02 Had a fall or experienced change in No Accompanied By: self activities of daily living that may affect Transfer Assistance: None risk of falls: Patient Identification Verified: Yes Signs or symptoms of abuse/neglect since last visito No Secondary Verification Process Completed: Yes Hospitalized since last visit: No Patient Requires Transmission-Based Precautions: No Implantable device outside of the clinic excluding No Patient Has Alerts: No cellular tissue based products placed in the center since last visit: Has Dressing in Place as Prescribed: Yes Pain Present Now: No Electronic Signature(s) Signed: 10/21/2022 3:16:03 PM By: Cody Pax RN Entered By: [name] on 10/21/2022 08:05:50 -------------------------------------------------------------------------------- Clinic Level of Care Assessment Details Patient Name: Date of Service: [name] 10/21/2022 8:00 A M Medical Record Number: [id number] Patient Account Number: [id number] Date of Birth/Sex: Treating RN: 08-May-1971 (51 y.o. [name]) [name] Primary Care [name]: SYSTEM, PCP Other Clinician: Referring [name]: Treating [name]: 4 Clinic Level of Care Assessment Items TOOL 1 Quantity Score []  -  0 Use when EandM and Procedure is performed on INITIAL visit ASSESSMENTS - Nursing Assessment / Reassessment []  - 0 General Physical Exam (combine w/ comprehensive assessment (listed just below) when performed on Cody pt. evals) []  - 0 Comprehensive Assessment (HX, ROS, Risk Assessments, Wounds Hx, etc.) [name], [name] ([id number]) [id number] Page 2 of 7 ASSESSMENTS - Wound and Skin Assessment / Reassessment []  - 0 Dermatologic / Skin Assessment (not related to wound area) ASSESSMENTS - Ostomy and/or Continence Assessment and Care []  - 0 Incontinence Assessment and Management []  - 0 Ostomy Care Assessment and Management (repouching, etc.) PROCESS - Coordination of Care []  - 0 Simple Patient / Family Education for ongoing care []  - 0 Complex (extensive) Patient / Family Education for ongoing care []  - 0 Staff obtains Chiropractor, Records, [name] []  - 0 Staff telephones HHA, Nursing Homes / Clarify orders / etc []  - 0 Routine Transfer to another Facility (non-emergent condition) []  - 0 Routine Hospital Admission (non-emergent condition) []  - 0 Cody Admissions / Manufacturing engineer / Ordering NPWT Apligraf, etc. , []  - 0 Emergency Hospital Admission (emergent condition) PROCESS - Special Needs []  - 0 Pediatric / Minor Patient Management []  - 0 Isolation Patient Management []  - 0 Hearing / Language / Visual special needs []  - 0 Assessment of Community assistance (transportation, Cody/C planning, etc.) []  - 0 Additional assistance / Altered mentation []  - 0 Support Surface(s) Assessment (bed, cushion, seat, etc.) INTERVENTIONS - Miscellaneous []  - 0 External ear exam []  - 0 Patient Transfer (multiple staff / Nurse, adult / Similar devices) []  - 0 Simple Staple / Suture removal (25 or less) []  - 0 Complex Staple / Suture removal (26 or more) []  - 0 Hypo/Hyperglycemic Management (do not check if billed separately) []  -  0 Ankle / Brachial Index (ABI) - do not check if billed separately Has the patient been seen at the hospital  within the last three years: Yes Total Score: 0 Level Of Care: ____ Electronic Signature(s) Signed: 10/21/2022 3:16:03 PM By: Cody Pax RN Entered By: [name] on 10/21/2022 08:28:52 -------------------------------------------------------------------------------- Encounter Discharge Information Details Patient Name: Date of Service: [name], Cody Grooms [name]. 10/21/2022 8:00 A M Medical Record Number: [id number] Patient Account Number: [id number] Date of Birth/Sex: Treating RN: 09-29-1970 (51 y.o. [name] Primary Care [name]: SYSTEM, PCP Other Clinician: Referring [name]: Treating [name]/Extender: [name] in [name]: 4 [name], [name] ([id number]) [id number] Page 3 of 7 Encounter Discharge Information Items Post Procedure Vitals Discharge Condition: Stable Temperature (F): 98.3 Ambulatory Status: Ambulatory Pulse (bpm): 76 Discharge Destination: Home Respiratory Rate (breaths/min): 16 Transportation: Private Auto Blood Pressure (mmHg): 147/77 Accompanied By: self Schedule Follow-up Appointment: Yes Clinical Summary of Care: Electronic Signature(s) Signed: 10/21/2022 3:16:03 PM By: Cody Pax RN Entered By: [name] on 10/21/2022 08:29:53 -------------------------------------------------------------------------------- Lower Extremity Assessment Details Patient Name: Date of Service: [name], [name]. 10/21/2022 8:00 A M Medical Record Number: [id number] Patient Account Number: [id number] Date of Birth/Sex: Treating RN: 11-Nov-1970 (51 y.o. [name]) [name] Primary Care Parley Pidcock: SYSTEM, PCP Other Clinician: Referring Henriette Hesser: Treating Donyale Falcon/Extender: Baxter Kail Weeks in [name]: 4 Electronic Signature(s) Signed: 10/21/2022 3:16:03 PM By: Cody Pax RN Entered By: [name] on  10/21/2022 08:08:42 -------------------------------------------------------------------------------- Multi Wound Chart Details Patient Name: Date of Service: [name], Cody Grooms [name]. 10/21/2022 8:00 A M Medical Record Number: [id number] Patient Account Number: [id number] Date of Birth/Sex: Treating RN: [birth year] (51 y.o. [name]) [name] Primary Care Dora Clauss: SYSTEM, PCP Other Clinician: Referring Clara Smolen: Treating Tyrick Dunagan/Extender: Baxter Kail Weeks in [name]: 4 Vital Signs Height(in): 68 Pulse(bpm): 76 Weight(lbs): 210 Blood Pressure(mmHg): 147/77 Body Mass Index(BMI): 31.9 Temperature(F): 98.3 Respiratory Rate(breaths/min): 16 [1:Photos:] KHA, ORBIN ([id number]) [1:Photos:] [N/A:N/A] Right [name] Great oe N/A N/A Wound Location: Blister N/A N/A Wounding Event: Neuropathic Ulcer-Non Diabetic N/A N/A Primary Etiology: Arrhythmia N/A N/A Comorbid History: 08/22/2021 N/A N/A Date Acquired: 4 N/A N/A Weeks of [name]: Open N/A N/A Wound Status: No N/A N/A Wound Recurrence: 1.1x0.9x0.2 N/A N/A Measurements L x W x Cody (cm) 0.778 N/A N/A A (cm) : rea 0.156 N/A N/A Volume (cm) : -37.70% N/A N/A % Reduction in A rea: -38.10% N/A N/A % Reduction in Volume: Full Thickness Without Exposed N/A N/A Classification: Support Structures Medium N/A N/A Exudate Amount: Serosanguineous N/A N/A Exudate Type: red, brown N/A N/A Exudate Color: Large (67-100%) N/A N/A Granulation Amount: Red N/A N/A Granulation Quality: None Present (0%) N/A N/A Necrotic Amount: Fat Layer (Subcutaneous Tissue): Yes N/A N/A Exposed Structures: Fascia: No Tendon: No Muscle: No Joint: No Bone: No None N/A N/A Epithelialization: [name] Notes Electronic Signature(s) Signed: 10/21/2022 3:16:03 PM By: Cody Pax RN Entered By: [name] on 10/21/2022 08:08:46 -------------------------------------------------------------------------------- Multi-Disciplinary Care Plan  Details Patient Name: Date of Service: [name], Cody Grooms [name]. 10/21/2022 8:00 A M Medical Record Number: [id number] Patient Account Number: [id number] Date of Birth/Sex: Treating RN: 05-Feb-1971 (51 y.o. [name] Primary Care Taijah Macrae: SYSTEM, PCP Other Clinician: Referring Mance Vallejo: Treating Sundi Slevin/Extender: [name] in [name]: 4 Active Inactive Electronic Signature(s) Signed: 12/03/2022 1:43:15 PM By: Elliot Gurney, BSN, RN, CWS, Kim RN, BSN Signed: 12/28/2022 3:49:34 PM By: Cody Pax RN Previous Signature: 10/21/2022 3:16:03 PM Version By: Cody Pax RN Entered By: Elliot Gurney, BSN, RN, CWS, Kim on 12/03/2022 13:43:15 [name] ([id number]) [id number] Page 5 of 7 -------------------------------------------------------------------------------- Pain Assessment Details  Patient Name: Date of Service: [name], Cody [place]. 10/21/2022 8:00 A M Medical Record Number: [id number] Patient Account Number: [id number] Date of Birth/Sex: Treating RN: 07/16/1970 (51 y.o. [name]) [name] Primary Care Madina Galati: SYSTEM, PCP Other Clinician: Referring Clenton Esper: Treating Aster Screws/Extender: Baxter Kail Weeks in [name]: 4 Active Problems Location of Pain Severity and Description of Pain Patient Has Paino No Site Locations Pain Management and Medication Current Pain Management: Electronic Signature(s) Signed: 10/21/2022 3:16:03 PM By: Cody Pax RN Entered By: [name] on 10/21/2022 08:06:17 -------------------------------------------------------------------------------- Patient/Caregiver Education Details Patient Name: Date of Service: [name] 5/30/2024andnbsp8:00 A M Medical Record Number: [id number] Patient Account Number: [id number] Date of Birth/Gender: Treating RN: Mar 21, 1971 (51 y.o. [name] Primary Care Physician: SYSTEM, PCP Other Clinician: Referring Physician: Treating Physician/Extender: [name] in [name]: 4 [place], [name] ([id number]) [id number] Page 6 of 7 Education Assessment Education Provided To: Patient Education Topics Provided Wound/Skin Impairment: Handouts: Caring for Your Ulcer Methods: Explain/Verbal Responses: State content correctly Electronic Signature(s) Signed: 10/21/2022 3:16:03 PM By: Cody Pax RN Entered By: [name] on 10/21/2022 08:09:05 -------------------------------------------------------------------------------- Wound Assessment Details Patient Name: Date of Service: [name], Cody Grooms [name]. 10/21/2022 8:00 A M Medical Record Number: [id number] Patient Account Number: [id number] Date of Birth/Sex: Treating RN: [birth year] (51 y.o. [name]) [name] Primary Care Reylynn Vanalstine: SYSTEM, PCP Other Clinician: Referring Kiyona Mcnall: Treating Ankur Snowdon/Extender: Baxter Kail Weeks in [name]: 4 Wound Status Wound Number: 1 Primary Etiology: Neuropathic Ulcer-Non Diabetic Wound Location: Right [name] Great oe Wound Status: Open Wounding Event: Blister Comorbid History: Arrhythmia Date Acquired: 08/22/2021 Weeks Of [name]: 4 Clustered Wound: No Photos Wound Measurements Length: (cm) 1.1 Width: (cm) 0.9 Depth: (cm) 0.2 Area: (cm) 0.778 Volume: (cm) 0.156 % Reduction in Area: -37.7% % Reduction in Volume: -38.1% Epithelialization: None Tunneling: No Undermining: No Wound Description Classification: Full Thickness Without Exposed Support Structures Exudate Amount: [name], [name] ([id number]) Exudate Type: Serosanguineous Exudate Color: red, brown Foul Odor After Cleansing: No Slough/Fibrino No [id number] Page 7 of 7 Wound Bed Granulation Amount: Large (67-100%) Exposed Structure Granulation Quality: Red Fascia Exposed: No Necrotic Amount: None Present (0%) Fat Layer (Subcutaneous Tissue) Exposed: Yes Tendon Exposed: No Muscle Exposed: No Joint  Exposed: No Bone Exposed: No Electronic Signature(s) Signed: 10/21/2022 3:16:03 PM By: Cody Pax RN Entered By: [name] on 10/21/2022 08:08:32 -------------------------------------------------------------------------------- Vitals Details Patient Name: Date of Service: [name], DA NNY [name]. 10/21/2022 8:00 A M Medical Record Number: [id number] Patient Account Number: [id number] Date of Birth/Sex: Treating RN: 02/20/1971 (51 y.o. [name]) [name] Primary Care Talmadge Ganas: SYSTEM, PCP Other Clinician: Referring Haidynn Almendarez: Treating Kieley Akter/Extender: [name] in [name]: 4 Vital Signs Time Taken: 08:05 Temperature (F): 98.3 Height (in): 68 Pulse (bpm): 76 Weight (lbs): 210 Respiratory Rate (breaths/min): 16 Body Mass Index (BMI): 31.9 Blood Pressure (mmHg): 147/77 Reference Range: 80 - 120 mg / dl Electronic Signature(s) Signed: 10/21/2022 3:16:03 PM By: Cody Pax RN Entered By: [name] on 10/21/2022 08:06:12

## 2022-12-30 ENCOUNTER — Other Ambulatory Visit: Payer: Self-pay | Admitting: Urology

## 2023-01-02 ENCOUNTER — Other Ambulatory Visit: Payer: Self-pay | Admitting: Urology

## 2023-01-03 ENCOUNTER — Other Ambulatory Visit: Payer: Self-pay | Admitting: *Deleted

## 2023-01-04 MED ORDER — TESTOSTERONE 20.25 MG/ACT (1.62%) TD GEL: TRANSDERMAL | 1 refills | Status: DC

## 2023-02-09 ENCOUNTER — Telehealth: Payer: Medicaid Other | Admitting: Physician Assistant

## 2023-02-09 DIAGNOSIS — G8929 Other chronic pain: Secondary | ICD-10-CM | POA: Diagnosis not present

## 2023-02-09 DIAGNOSIS — Z760 Encounter for issue of repeat prescription: Secondary | ICD-10-CM

## 2023-02-09 DIAGNOSIS — M25569 Pain in unspecified knee: Secondary | ICD-10-CM | POA: Diagnosis not present

## 2023-02-09 MED ORDER — TIZANIDINE HCL 4 MG PO TABS
4.0000 mg | ORAL_TABLET | Freq: Three times a day (TID) | ORAL | 0 refills | Status: DC
Start: 2023-02-09 — End: 2023-05-13

## 2023-02-09 NOTE — Progress Notes (Signed)
Virtual Visit Consent   [name], you are scheduled for a virtual visit with a [facility] provider today. Just as with appointments in the office, your consent must be obtained to participate. Your consent will be active for this visit and any virtual visit you may have with one of our providers in the next 365 days. If you have a MyChart account, a copy of this consent can be sent to you electronically.  As this is a virtual visit, video technology does not allow for your provider to perform a traditional examination. This may limit your provider's ability to fully assess your condition. If your provider identifies any concerns that need to be evaluated in person or the need to arrange testing (such as labs, EKG, etc.), we will make arrangements to do so. Although advances in technology are sophisticated, we cannot ensure that it will always work on either your end or our end. If the connection with a video visit is poor, the visit may have to be switched to a telephone visit. With either a video or telephone visit, we are not always able to ensure that we have a secure connection.  By engaging in this virtual visit, you consent to the provision of healthcare and authorize for your insurance to be billed (if applicable) for the services provided during this visit. Depending on your insurance coverage, you may receive a charge related to this service.  I need to obtain your verbal consent now. Are you willing to proceed with your visit today? [name] has provided verbal consent on 02/09/2023 for a virtual visit (video or telephone). [name], [place]  Date: 02/09/2023 5:55 PM  Virtual Visit via Video Note   I, [name], connected with  [name]  ([id number], February 16, 1971) on 02/09/23 at  6:00 PM EDT by a video-enabled telemedicine application and verified that I am speaking with the correct person using two identifiers.  Location: Patient: Virtual Visit  Location Patient: Home Provider: Virtual Visit Location Provider: Home Office   I discussed the limitations of evaluation and management by telemedicine and the availability of in person appointments. The patient expressed understanding and agreed to proceed.    History of Present Illness: [name] is a 52 y.o. who identifies as a male who was assigned male at birth, and is being seen today for  Medication refill (tizanidine) for ongoing musculoskeletal pain and spasm as he is again between PCP as his most recent PCP retired. Also requesting resources for ortho care since he is without a PCP, for ongoing R knee pain and possible meniscal tear previously evaluated at outside facility.  HPI: HPI  Problems:  Patient Active Problem List   Diagnosis Date Noted   GI bleed 08/03/2021   Depression 08/03/2021   Hypomagnesemia 06/26/2021   Cellulitis of right foot 06/25/2021   Alcohol use disorder, severe, dependence (HCC) 06/25/2021   Alcoholic cirrhosis of liver (HCC) 06/25/2021   Right foot drop 06/25/2021   Skin ulcer of foot including toes with fat layer exposed (HCC) 06/25/2021   History of recent fall 06/25/2021   Hepatic cirrhosis due to chronic hepatitis C infection (HCC) 06/24/2020   PAF (paroxysmal atrial fibrillation) (HCC)    Pure hypercholesterolemia    Alcohol abuse    Goals of care, counseling/discussion    Palliative care by specialist    Rhabdomyolysis 01/25/2020   Sepsis (HCC) 01/25/2020   Hypovolemia 01/25/2020   Hypotension 01/25/2020   GIB (gastrointestinal  bleeding) 01/24/2020   Transaminitis 01/24/2020   Hyponatremia 01/24/2020   Hypokalemia 01/24/2020   Falls 01/24/2020   Hip pain 01/24/2020   Acute blood loss anemia 01/23/2020   Substance induced mood disorder (HCC) 05/04/2017    Allergies: No Known Allergies Medications:  Current Outpatient Medications:    aspirin 81 MG chewable tablet, Chew 81 mg by mouth daily., Disp: , Rfl:    cephALEXin  (KEFLEX) 500 MG capsule, Take 2 capsules (1,000 mg total) by mouth 2 (two) times daily., Disp: 28 capsule, Rfl: 0   hydrochlorothiazide (HYDRODIURIL) 12.5 MG tablet, Take 12.5 mg by mouth daily., Disp: , Rfl:    lactulose (CHRONULAC) 10 GM/15ML solution, SMARTSIG:Tablespoon By Mouth Daily, Disp: , Rfl:    meloxicam (MOBIC) 15 MG tablet, Take 1 tablet (15 mg total) by mouth daily., Disp: 30 tablet, Rfl: 0   Multiple Vitamins-Minerals (MULTIVITAMIN WITH MINERALS) tablet, Take 1 tablet by mouth daily., Disp: , Rfl:    pantoprazole (PROTONIX) 40 MG tablet, Take 1 tablet (40 mg total) by mouth 2 (two) times daily before a meal., Disp: 60 tablet, Rfl: 1   Testosterone 20.25 MG/ACT (1.62%) GEL, Apply 3 pumps daily, Disp: 1.62 g, Rfl: 1   tiZANidine (ZANAFLEX) 4 MG tablet, Take 1 tablet (4 mg total) by mouth 3 (three) times daily., Disp: 30 tablet, Rfl: 0  Observations/Objective: Patient is well-developed, well-nourished in no acute distress.  Resting comfortably  at home.  Head is normocephalic, atraumatic.  No labored breathing.  Speech is clear and coherent with logical content.  Patient is alert and oriented at baseline.   Assessment and Plan: 1. Medication refill - tiZANidine (ZANAFLEX) 4 MG tablet; Take 1 tablet (4 mg total) by mouth 3 (three) times daily.  Dispense: 30 tablet; Refill: 0  2. Chronic knee pain, unspecified laterality  One time refill given.  He is aware we cannot place referrals. I sent him list of resources for our ortho after hours walk in clinics so he can get the evaluation needed without having to wait until he can establish with a new PCP.  Follow Up Instructions: I discussed the assessment and treatment plan with the patient. The patient was provided an opportunity to ask questions and all were answered. The patient agreed with the plan and demonstrated an understanding of the instructions.  A copy of instructions were sent to the patient via MyChart unless otherwise  noted below.   The patient was advised to call back or seek an in-person evaluation if the symptoms worsen or if the condition fails to improve as anticipated.  Time:  I spent 10 minutes with the patient via telehealth technology discussing the above problems/concerns.    Cody Climes, PA-C

## 2023-02-09 NOTE — Patient Instructions (Signed)
Jamelle Rushing, thank you for joining Piedad Climes, PA-C for today's virtual visit.  While this provider is not your primary care provider (PCP), if your PCP is located in our provider database this encounter information will be shared with them immediately following your visit.   A [facility] MyChart account gives you access to today's visit and all your visits, tests, and labs performed at [facility] " click here if you don't have a [facility] MyChart account or go to [email or web address]  Consent: (Patient) [name] provided verbal consent for this virtual visit at the beginning of the encounter.  Current Medications:  Current Outpatient Medications:    aspirin 81 MG chewable tablet, Chew 81 mg by mouth daily., Disp: , Rfl:    cephALEXin (KEFLEX) 500 MG capsule, Take 2 capsules (1,000 mg total) by mouth 2 (two) times daily., Disp: 28 capsule, Rfl: 0   hydrochlorothiazide (HYDRODIURIL) 12.5 MG tablet, Take 12.5 mg by mouth daily., Disp: , Rfl:    lactulose (CHRONULAC) 10 GM/15ML solution, SMARTSIG:Tablespoon By Mouth Daily, Disp: , Rfl:    meloxicam (MOBIC) 15 MG tablet, Take 1 tablet (15 mg total) by mouth daily., Disp: 30 tablet, Rfl: 0   Multiple Vitamins-Minerals (MULTIVITAMIN WITH MINERALS) tablet, Take 1 tablet by mouth daily., Disp: , Rfl:    mupirocin ointment (BACTROBAN) 2 %, Apply 1 Application topically 2 (two) times daily., Disp: 22 g, Rfl: 1   pantoprazole (PROTONIX) 40 MG tablet, Take 1 tablet (40 mg total) by mouth 2 (two) times daily before a meal., Disp: 60 tablet, Rfl: 1   sulfamethoxazole-trimethoprim (BACTRIM DS) 800-160 MG tablet, Take 1 tablet by mouth 2 (two) times daily., Disp: 20 tablet, Rfl: 0   Testosterone 20.25 MG/ACT (1.62%) GEL, Apply 3 pumps daily, Disp: 1.62 g, Rfl: 1   tiZANidine (ZANAFLEX) 4 MG tablet, Take 1 tablet (4 mg total) by mouth 3 (three) times daily., Disp: 30 tablet, Rfl: 0   Medications ordered in this  encounter:  No orders of the defined types were placed in this encounter.    *If you need refills on other medications prior to your next appointment, please contact your pharmacy*  Follow-Up: Call back or seek an in-person evaluation if the symptoms worsen or if the condition fails to improve as anticipated.  [facility] [phone]  Other Instructions [facility] -- Same-Day [facility] Clinic   Monday - Friday   11AM-7PM   Schedule via website (see below) or as walk-in   [address]   [place], [place] [address]   [phone]   [facility]  [place], [place]  [place]      [facility]   [address],  [place], [place] [address]   GET DIRECTIONS   CONTACT   Phone: [phone]   Phone: [phone]   URGENT CARE HOURS   Monday - Friday:   8:00am to 8:00pm   Saturday:   10:00am to 3:00pm         [facility]   [address]  [place], [place] [address]   GET DIRECTIONS   CONTACT   Phone: [phone]   URGENT CARE HOURS   Monday:   9:00AM - 9:00PM   Tuesday:   9:00AM - 9:00PM   Wednesday:   9:00AM - 9:00PM   Thursday:   9:00AM - 9:00PM   Friday:   9:00AM - 9:00PM   Saturday:   9:00AM - 9:00PM  Sunday:   9:00AM - 9:00PM   HOLIDAY HOURS   Holidays:   8:30AM - 4:30PM      [facility]   After [facility] [phone]      [facility] [address] [place], [place] [address]      EVENINGS & WEEKENDS NO APPOINTMENT NECESSARY Mon-Fri 5:30PM - 9PM    Sat 9 AM - 2 PM Sun 10 AM - 2 PM      [facility]   [address]  [address]  [place], [place] [address]   [phone]      OFFICE HOURS:   MONDAY - FRIDAY : 8:00 A.M. - 4:00 P.M.      [facility]  [address]  [place], [place] [address]    [phone]      OFFICE HOURS:   MONDAY - FRIDAY: 8:00 A.M. - 8:30 P.M.   SATURDAY: 10:00 A.M. - 2:00 P.M.          If you have been instructed to have an in-person evaluation today at a local Urgent Care facility, please use the link below. It will take you to a list of all of our available [facility], including address, phone number and hours of operation. Please do not delay care.  [facility]  If you or a family member do not have a primary care provider, use the link below to schedule a visit and establish care. When you choose a [facility] primary care physician or advanced practice provider, you gain a long-term partner in health. Find a Primary Care Provider  Learn more about [facility] in-office and virtual care options: [facility] - Get Care Now

## 2023-03-16 ENCOUNTER — Other Ambulatory Visit: Payer: Medicaid Other

## 2023-03-18 ENCOUNTER — Other Ambulatory Visit: Payer: Self-pay | Admitting: Urology

## 2023-03-21 ENCOUNTER — Ambulatory Visit: Payer: Medicaid Other | Admitting: Urology

## 2023-03-22 ENCOUNTER — Other Ambulatory Visit: Payer: Medicaid Other

## 2023-03-28 ENCOUNTER — Other Ambulatory Visit: Payer: Medicaid Other

## 2023-03-30 ENCOUNTER — Telehealth: Payer: Self-pay | Admitting: Podiatry

## 2023-03-30 ENCOUNTER — Telehealth: Payer: Self-pay | Admitting: Urology

## 2023-03-30 NOTE — Telephone Encounter (Signed)
Called patient to scheduled

## 2023-03-30 NOTE — Telephone Encounter (Signed)
Pt called and the foot he has drop foot on has the toe that is getting irritated again and he said you usually call in a cream and antibiotic for him so it does not get infected. Could you please call that in for him The pharmacy is correct in the chart.

## 2023-03-30 NOTE — Telephone Encounter (Signed)
Pt has had lots of issues with transportation and had to cancel/reschedule several appts.  He wants to know when he needs to r/s lab appt and appt with stoioff based on when he does his injections.

## 2023-03-31 ENCOUNTER — Ambulatory Visit: Payer: Medicaid Other | Admitting: Urology

## 2023-04-01 ENCOUNTER — Other Ambulatory Visit: Payer: Self-pay | Admitting: Podiatry

## 2023-04-01 MED ORDER — GENTAMICIN SULFATE 0.1 % EX CREA: 1.0000 | TOPICAL_CREAM | Freq: Two times a day (BID) | CUTANEOUS | 1 refills | Status: DC

## 2023-04-01 NOTE — Telephone Encounter (Signed)
Refill sent for the gentamicin cream. -Dr. Logan Bores

## 2023-04-04 ENCOUNTER — Other Ambulatory Visit: Payer: Medicaid Other

## 2023-04-04 NOTE — Telephone Encounter (Signed)
Pt called and was asking about getting the medication refill.  Upon checking the medication was sent in and pt is aware. He said thank you

## 2023-04-13 ENCOUNTER — Ambulatory Visit: Payer: Medicaid Other | Admitting: Urology

## 2023-04-29 ENCOUNTER — Other Ambulatory Visit: Payer: Medicaid Other

## 2023-05-02 ENCOUNTER — Ambulatory Visit: Payer: Medicaid Other | Admitting: Urology

## 2023-05-10 ENCOUNTER — Other Ambulatory Visit: Payer: Medicaid Other

## 2023-05-10 DIAGNOSIS — R6882 Decreased libido: Secondary | ICD-10-CM

## 2023-05-10 DIAGNOSIS — E291 Testicular hypofunction: Secondary | ICD-10-CM

## 2023-05-11 LAB — TESTOSTERONE: Testosterone: 142 ng/dL — ABNORMAL LOW (ref 264–916)

## 2023-05-11 LAB — PSA: Prostate Specific Ag, Serum: 0.2 ng/mL (ref 0.0–4.0)

## 2023-05-11 LAB — HEMOGLOBIN AND HEMATOCRIT, BLOOD
Hematocrit: 40.1 % (ref 37.5–51.0)
Hemoglobin: 13.6 g/dL (ref 13.0–17.7)

## 2023-05-13 ENCOUNTER — Ambulatory Visit (INDEPENDENT_AMBULATORY_CARE_PROVIDER_SITE_OTHER): Payer: Medicaid Other | Admitting: Urology

## 2023-05-13 ENCOUNTER — Encounter: Payer: Self-pay | Admitting: Urology

## 2023-05-13 ENCOUNTER — Ambulatory Visit: Payer: Medicaid Other | Admitting: Urology

## 2023-05-13 VITALS — BP 130/74 | HR 84 | Ht 69.0 in | Wt 260.0 lb

## 2023-05-13 DIAGNOSIS — E291 Testicular hypofunction: Secondary | ICD-10-CM

## 2023-05-13 MED ORDER — TESTOSTERONE 20.25 MG/ACT (1.62%) TD GEL: TRANSDERMAL | 1 refills | Status: DC

## 2023-05-13 NOTE — Progress Notes (Signed)
Marcelle Overlie Plume,acting as a scribe for Riki Altes, MD.,have documented all relevant documentation on the behalf of Riki Altes, MD,as directed by  Riki Altes, MD while in the presence of Riki Altes, MD.  05/13/23 12:22 PM   [name] 07/13/1970 [id number]  Referring provider: No referring provider defined for this encounter.  Chief Complaint  Patient presents with   Hypogonadism   Urologic history: 1. Hypogonadism Initial visit 06/2022 with history of ED and tiredness, fatigue, and decreased libido. T-level x2 <200 with normal LH Started testosterone gel  HPI: [name] is a 52 y.o. male who presents today for follow up.   He noticed significant improvement in his symptoms with better energy level, libido, and improved mood on testosterone gel. He states he was able to discontinue antidepressant medication. On 4 pumps, his testosterone level was 995 and he was decreased to 3 pumps. He noted marked improvement in symptoms on 3 pumps. However, ran out of medication before his 6 month labs and his testosterone level on 05/10/2023 was 142. PSA was 0.2 and H&H was 13.6-40.1   PMH: Past Medical History:  Diagnosis Date   Alcohol abuse    Anemia    a. 01/2020 s/p fall w/ significant R hip hematoma-->4 u prbcs during admission.   Delirium tremens (HCC) 01/2020   Hepatic failure (HCC)    a. 01/2020 - ETOH/sepsis/rhabdo   Polysubstance abuse (HCC)    Respiratory failure (HCC)    a. 01/2020 in setting of DTs/Sepsis req intubation.   Rhabdomyolysis 01/2020   Sepsis (HCC) 01/2020    Surgical History: Past Surgical History:  Procedure Laterality Date   ESOPHAGOGASTRODUODENOSCOPY (EGD) WITH PROPOFOL N/A 08/03/2021   Procedure: ESOPHAGOGASTRODUODENOSCOPY (EGD) WITH PROPOFOL;  Surgeon: Regis Bill, MD;  Location: [facility] ENDOSCOPY;  Service: Endoscopy;  Laterality: N/A;    Home Medications:  Allergies as of 05/13/2023   No Known Allergies       Medication List        Accurate as of May 13, 2023 12:22 PM. If you have any questions, ask your nurse or doctor.          STOP taking these medications    cephALEXin 500 MG capsule Commonly known as: KEFLEX Stopped by: Riki Altes   gentamicin cream 0.1 % Commonly known as: GARAMYCIN Stopped by: Riki Altes   meloxicam 15 MG tablet Commonly known as: MOBIC Stopped by: Riki Altes   pantoprazole 40 MG tablet Commonly known as: Protonix Stopped by: Riki Altes   tiZANidine 4 MG tablet Commonly known as: ZANAFLEX Stopped by: Riki Altes       TAKE these medications    aspirin 81 MG chewable tablet Chew 81 mg by mouth daily.   hydrochlorothiazide 12.5 MG tablet Commonly known as: HYDRODIURIL Take 12.5 mg by mouth daily.   lactulose 10 GM/15ML solution Commonly known as: CHRONULAC SMARTSIG:Tablespoon By Mouth Daily   multivitamin with minerals tablet Take 1 tablet by mouth daily.   Testosterone 20.25 MG/ACT (1.62%) Gel Apply 3 pumps daily         Social History:  reports that he has been smoking e-cigarettes. He has never used smokeless tobacco. He reports current alcohol use. He reports that he does not currently use drugs.   Physical Exam: BP 130/74   Pulse 84   Ht 5\' 9"  (1.753 m)   Wt 260 lb (117.9 kg)   BMI 38.40 kg/m  Constitutional:  Alert and oriented, No acute distress. HEENT: [place] AT, moist mucus membranes.  Trachea midline, no masses. Neurologic: Grossly intact, no focal deficits, moving all 4 extremities. Psychiatric: Normal mood and affect.   Assessment & Plan:    1. Hypogonadism Significant improvement in symptoms on generic androgel 1.62%, 3 pumps daily Ran out of medication prior to his blood work Androgel was refilled and will schedule a lab visit in 6 weeks. Additional follow up will be scheduled once the follow up testosterone level is reviewed   [facility] [address] [place], [place] [address] [phone]

## 2023-05-27 ENCOUNTER — Encounter: Payer: Self-pay | Admitting: Nurse Practitioner

## 2023-05-27 ENCOUNTER — Telehealth: Payer: Medicaid Other

## 2023-06-24 ENCOUNTER — Encounter: Payer: Self-pay | Admitting: Podiatry

## 2023-06-24 ENCOUNTER — Other Ambulatory Visit: Payer: Medicaid Other

## 2023-06-24 ENCOUNTER — Ambulatory Visit (INDEPENDENT_AMBULATORY_CARE_PROVIDER_SITE_OTHER): Payer: Medicaid Other

## 2023-06-24 ENCOUNTER — Ambulatory Visit (INDEPENDENT_AMBULATORY_CARE_PROVIDER_SITE_OTHER): Payer: Medicaid Other | Admitting: Podiatry

## 2023-06-24 DIAGNOSIS — Z8739 Personal history of other diseases of the musculoskeletal system and connective tissue: Secondary | ICD-10-CM | POA: Diagnosis not present

## 2023-06-24 DIAGNOSIS — L97512 Non-pressure chronic ulcer of other part of right foot with fat layer exposed: Secondary | ICD-10-CM

## 2023-06-24 MED ORDER — GENTAMICIN SULFATE 0.1 % EX CREA: 1.0000 | TOPICAL_CREAM | Freq: Two times a day (BID) | CUTANEOUS | 1 refills | Status: DC

## 2023-06-24 MED ORDER — AMOXICILLIN-POT CLAVULANATE 875-125 MG PO TABS: 1.0000 | ORAL_TABLET | Freq: Two times a day (BID) | ORAL | 0 refills | Status: DC

## 2023-06-27 ENCOUNTER — Encounter: Payer: Self-pay | Admitting: Podiatry

## 2023-06-28 ENCOUNTER — Other Ambulatory Visit: Payer: MEDICAID

## 2023-06-28 DIAGNOSIS — E291 Testicular hypofunction: Secondary | ICD-10-CM

## 2023-06-29 LAB — TESTOSTERONE: Testosterone: 948 ng/dL — ABNORMAL HIGH (ref 264–916)

## 2023-07-05 ENCOUNTER — Encounter: Payer: Self-pay | Admitting: Podiatry

## 2023-07-08 ENCOUNTER — Ambulatory Visit
Admission: RE | Admit: 2023-07-08 | Discharge: 2023-07-08 | Disposition: A | Discharge status: Home / Self Care | Payer: MEDICAID | Source: Ambulatory Visit | Attending: Podiatry | Admitting: Podiatry

## 2023-07-08 DIAGNOSIS — L97512 Non-pressure chronic ulcer of other part of right foot with fat layer exposed: Secondary | ICD-10-CM

## 2023-07-20 ENCOUNTER — Telehealth: Payer: Self-pay | Admitting: Podiatry

## 2023-07-20 NOTE — Telephone Encounter (Signed)
 Called pt to get an appt to see Dr Logan Bores to discuss mri results but voicemail is fill.  I did send a my chart message with my number to have him call me to schedule an appt. He is a [facility] pt.

## 2023-07-27 ENCOUNTER — Telehealth: Payer: MEDICAID | Admitting: Physician Assistant

## 2023-07-27 DIAGNOSIS — J069 Acute upper respiratory infection, unspecified: Secondary | ICD-10-CM | POA: Diagnosis not present

## 2023-07-27 DIAGNOSIS — B9689 Other specified bacterial agents as the cause of diseases classified elsewhere: Secondary | ICD-10-CM

## 2023-07-27 MED ORDER — ALBUTEROL SULFATE HFA 108 (90 BASE) MCG/ACT IN AERS: 2.0000 | INHALATION_SPRAY | Freq: Four times a day (QID) | RESPIRATORY_TRACT | 0 refills | Status: DC | PRN

## 2023-07-27 MED ORDER — BENZONATATE 100 MG PO CAPS: 100.0000 mg | ORAL_CAPSULE | Freq: Three times a day (TID) | ORAL | 0 refills | Status: DC | PRN

## 2023-07-27 MED ORDER — AZITHROMYCIN 250 MG PO TABS: ORAL_TABLET | ORAL | 0 refills | Status: AC

## 2023-07-27 NOTE — Progress Notes (Signed)
 Virtual Visit Consent   [name], you are scheduled for a virtual visit with a [facility] provider today. Just as with appointments in the office, your consent must be obtained to participate. Your consent will be active for this visit and any virtual visit you may have with one of our providers in the next 365 days. If you have a MyChart account, a copy of this consent can be sent to you electronically.  As this is a virtual visit, video technology does not allow for your provider to perform a traditional examination. This may limit your provider's ability to fully assess your condition. If your provider identifies any concerns that need to be evaluated in person or the need to arrange testing (such as labs, EKG, etc.), we will make arrangements to do so. Although advances in technology are sophisticated, we cannot ensure that it will always work on either your end or our end. If the connection with a video visit is poor, the visit may have to be switched to a telephone visit. With either a video or telephone visit, we are not always able to ensure that we have a secure connection.  By engaging in this virtual visit, you consent to the provision of healthcare and authorize for your insurance to be billed (if applicable) for the services provided during this visit. Depending on your insurance coverage, you may receive a charge related to this service.  I need to obtain your verbal consent now. Are you willing to proceed with your visit today? [name] has provided verbal consent on 07/27/2023 for a virtual visit (video or telephone). Piedad Climes, [place]  Date: 07/27/2023 6:00 PM   Virtual Visit via Video Note   I, Piedad Climes, connected with  [name]  ([id number], May 23, 1971) on 07/27/23 at  6:00 PM EST by a video-enabled telemedicine application and verified that I am speaking with the correct person using two identifiers.  Location: Patient: Virtual Visit  Location Patient: Home Provider: Virtual Visit Location Provider: Home Office   I discussed the limitations of evaluation and management by telemedicine and the availability of in person appointments. The patient expressed understanding and agreed to proceed.    History of Present Illness: [name] is a 53 y.o. who identifies as a male who was assigned male at birth, and is being seen today for URI symptoms starting about 6 days with chest congestion and cough that is now productive of thick phlegm and associated wheezing and voice hoarseness.  Cough is sometimes productive of clear phlegm and other times thick green.   HPI: HPI  Problems:  Patient Active Problem List   Diagnosis Date Noted   GI bleed 08/03/2021   Depression 08/03/2021   Hypomagnesemia 06/26/2021   Cellulitis of right foot 06/25/2021   Alcohol use disorder, severe, dependence (HCC) 06/25/2021   Alcoholic cirrhosis of liver (HCC) 06/25/2021   Right foot drop 06/25/2021   Skin ulcer of foot including toes with fat layer exposed (HCC) 06/25/2021   History of recent fall 06/25/2021   Hepatic cirrhosis due to chronic hepatitis C infection (HCC) 06/24/2020   PAF (paroxysmal atrial fibrillation) (HCC)    Pure hypercholesterolemia    Alcohol abuse    Goals of care, counseling/discussion    Palliative care by specialist    Rhabdomyolysis 01/25/2020   Sepsis (HCC) 01/25/2020   Hypovolemia 01/25/2020   Hypotension 01/25/2020   GIB (gastrointestinal bleeding) 01/24/2020   Transaminitis 01/24/2020   Hyponatremia  01/24/2020   Hypokalemia 01/24/2020   Falls 01/24/2020   Hip pain 01/24/2020   Acute blood loss anemia 01/23/2020   Substance induced mood disorder (HCC) 05/04/2017    Allergies: No Known Allergies Medications:  Current Outpatient Medications:    albuterol (VENTOLIN HFA) 108 (90 Base) MCG/ACT inhaler, Inhale 2 puffs into the lungs every 6 (six) hours as needed for wheezing or shortness of breath., Disp:  8 g, Rfl: 0   azithromycin (ZITHROMAX) 250 MG tablet, Take 2 tablets on day 1, then 1 tablet daily on days 2 through 5, Disp: 6 tablet, Rfl: 0   benzonatate (TESSALON) 100 MG capsule, Take 1 capsule (100 mg total) by mouth 3 (three) times daily as needed for cough., Disp: 30 capsule, Rfl: 0   aspirin 81 MG chewable tablet, Chew 81 mg by mouth daily., Disp: , Rfl:    hydrochlorothiazide (HYDRODIURIL) 12.5 MG tablet, Take 12.5 mg by mouth daily., Disp: , Rfl:    lactulose (CHRONULAC) 10 GM/15ML solution, SMARTSIG:Tablespoon By Mouth Daily, Disp: , Rfl:    Multiple Vitamins-Minerals (MULTIVITAMIN WITH MINERALS) tablet, Take 1 tablet by mouth daily., Disp: , Rfl:    Testosterone 20.25 MG/ACT (1.62%) GEL, Apply 3 pumps daily, Disp: 150 g, Rfl: 1  Observations/Objective: Patient is well-developed, well-nourished in no acute distress.  Resting comfortably at home.  Head is normocephalic, atraumatic.  No labored breathing. Speech is clear and coherent with logical content.  Patient is alert and oriented at baseline.   Assessment and Plan: 1. Bacterial URI (Primary) - benzonatate (TESSALON) 100 MG capsule; Take 1 capsule (100 mg total) by mouth 3 (three) times daily as needed for cough.  Dispense: 30 capsule; Refill: 0 - azithromycin (ZITHROMAX) 250 MG tablet; Take 2 tablets on day 1, then 1 tablet daily on days 2 through 5  Dispense: 6 tablet; Refill: 0 - albuterol (VENTOLIN HFA) 108 (90 Base) MCG/ACT inhaler; Inhale 2 puffs into the lungs every 6 (six) hours as needed for wheezing or shortness of breath.  Dispense: 8 g; Refill: 0  Rx Azithromycin.  Increase fluids.  Rest.  Saline nasal spray.  Probiotic.  Mucinex as directed.  Humidifier in bedroom. Tessalon and albuterol per orders.  Call or return to clinic if symptoms are not improving.   Follow Up Instructions: I discussed the assessment and treatment plan with the patient. The patient was provided an opportunity to ask questions and all were  answered. The patient agreed with the plan and demonstrated an understanding of the instructions.  A copy of instructions were sent to the patient via MyChart unless otherwise noted below.   The patient was advised to call back or seek an in-person evaluation if the symptoms worsen or if the condition fails to improve as anticipated.    Piedad Climes, PA-C

## 2023-07-27 NOTE — Patient Instructions (Signed)
 Jamelle Rushing, thank you for joining Piedad Climes, PA-C for today's virtual visit.  While this provider is not your primary care provider (PCP), if your PCP is located in our provider database this encounter information will be shared with them immediately following your visit.   A [facility] MyChart account gives you access to today's visit and all your visits, tests, and labs performed at [facility] " click here if you don't have a [facility] MyChart account or go to [email or web address]  Consent: (Patient) Quantay Zaremba provided verbal consent for this virtual visit at the beginning of the encounter.  Current Medications:  Current Outpatient Medications:    amoxicillin-clavulanate (AUGMENTIN) 875-125 MG tablet, Take 1 tablet by mouth 2 (two) times daily., Disp: 20 tablet, Rfl: 0   aspirin 81 MG chewable tablet, Chew 81 mg by mouth daily., Disp: , Rfl:    gentamicin cream (GARAMYCIN) 0.1 %, Apply 1 Application topically 2 (two) times daily., Disp: 30 g, Rfl: 1   hydrochlorothiazide (HYDRODIURIL) 12.5 MG tablet, Take 12.5 mg by mouth daily., Disp: , Rfl:    lactulose (CHRONULAC) 10 GM/15ML solution, SMARTSIG:Tablespoon By Mouth Daily, Disp: , Rfl:    Multiple Vitamins-Minerals (MULTIVITAMIN WITH MINERALS) tablet, Take 1 tablet by mouth daily., Disp: , Rfl:    Testosterone 20.25 MG/ACT (1.62%) GEL, Apply 3 pumps daily, Disp: 150 g, Rfl: 1   Medications ordered in this encounter:  No orders of the defined types were placed in this encounter.    *If you need refills on other medications prior to your next appointment, please contact your pharmacy*  Follow-Up: Call back or seek an in-person evaluation if the symptoms worsen or if the condition fails to improve as anticipated.  [facility] [phone]  Other Instructions Take antibiotic (Azithromycin) as directed.  Increase fluids.  Get plenty of rest. Use Mucinex for congestion. Take the  Tessalon and Albuterol as directed. Take a daily probiotic (I recommend Align or Culturelle, but even Activia Yogurt may be beneficial).  A humidifier placed in the bedroom may offer some relief for a dry, scratchy throat of nasal irritation.  Read information below on acute bronchitis. Please call or return to clinic if symptoms are not improving.  Acute Bronchitis Bronchitis is when the airways that extend from the windpipe into the lungs get red, puffy, and painful (inflamed). Bronchitis often causes thick spit (mucus) to develop. This leads to a cough. A cough is the most common symptom of bronchitis. In acute bronchitis, the condition usually begins suddenly and goes away over time (usually in 2 weeks). Smoking, allergies, and asthma can make bronchitis worse. Repeated episodes of bronchitis may cause more lung problems.  HOME CARE Rest. Drink enough fluids to keep your pee (urine) clear or pale yellow (unless you need to limit fluids as told by your doctor). Only take over-the-counter or prescription medicines as told by your doctor. Avoid smoking and secondhand smoke. These can make bronchitis worse. If you are a smoker, think about using nicotine gum or skin patches. Quitting smoking will help your lungs heal faster. Reduce the chance of getting bronchitis again by: Washing your hands often. Avoiding people with cold symptoms. Trying not to touch your hands to your mouth, nose, or eyes. Follow up with your doctor as told.  GET HELP IF: Your symptoms do not improve after 1 week of treatment. Symptoms include: Cough. Fever. Coughing up thick spit. Body aches. Chest congestion. Chills. Shortness of breath. Sore  throat.  GET HELP RIGHT AWAY IF:  You have an increased fever. You have chills. You have severe shortness of breath. You have bloody thick spit (sputum). You throw up (vomit) often. You lose too much body fluid (dehydration). You have a severe headache. You  faint.  MAKE SURE YOU:  Understand these instructions. Will watch your condition. Will get help right away if you are not doing well or get worse. Document Released: 10/27/2007 Document Revised: 01/10/2013 Document Reviewed: 10/31/2012 [facility] Patient Information 2015 [place], [place]. This information is not intended to replace advice given to you by your health care provider. Make sure you discuss any questions you have with your health care provider.    If you have been instructed to have an in-person evaluation today at a local Urgent Care facility, please use the link below. It will take you to a list of all of our available [facility], including address, phone number and hours of operation. Please do not delay care.  [facility]  If you or a family member do not have a primary care provider, use the link below to schedule a visit and establish care. When you choose a [facility] primary care physician or advanced practice provider, you gain a long-term partner in health. Find a Primary Care Provider  Learn more about [facility]'s in-office and virtual care options: [facility] - Get Care Now

## 2023-07-29 ENCOUNTER — Ambulatory Visit (INDEPENDENT_AMBULATORY_CARE_PROVIDER_SITE_OTHER): Payer: MEDICAID | Admitting: Podiatry

## 2023-07-29 ENCOUNTER — Encounter: Payer: Self-pay | Admitting: Podiatry

## 2023-07-29 DIAGNOSIS — M869 Osteomyelitis, unspecified: Secondary | ICD-10-CM

## 2023-07-29 NOTE — Progress Notes (Signed)
 Chief Complaint  Patient presents with   Foot Ulcer    Follow up ulcer plantar hallux right / MRI results  "I have an MRI done on this toe and he said he would need to see me right away unless it was bad. Its seems the same to me"    HPI: 53 y.o. male presenting today for follow-up evaluation of an ulcer to the right great toe.  MRI completed since last visit.  Presenting today to review the MRI results and discuss further treatment options  Past Medical History:  Diagnosis Date   Alcohol abuse    Anemia    a. 01/2020 s/p fall w/ significant R hip hematoma-->4 u prbcs during admission.   Delirium tremens (HCC) 01/2020   Hepatic failure (HCC)    a. 01/2020 - ETOH/sepsis/rhabdo   Polysubstance abuse (HCC)    Respiratory failure (HCC)    a. 01/2020 in setting of DTs/Sepsis req intubation.   Rhabdomyolysis 01/2020   Sepsis (HCC) 01/2020    Past Surgical History:  Procedure Laterality Date   ESOPHAGOGASTRODUODENOSCOPY (EGD) WITH PROPOFOL N/A 08/03/2021   Procedure: ESOPHAGOGASTRODUODENOSCOPY (EGD) WITH PROPOFOL;  Surgeon: Regis Bill, MD;  Location: [facility] ENDOSCOPY;  Service: Endoscopy;  Laterality: N/A;    No Known Allergies   06/24/2023 RT great toe   Physical Exam: General: The patient is alert and oriented x3 in no acute distress.  Dermatology: Chronic ulcer noted to the right great toe measuring approximately 1.2 x 1.0 x 0.3 cm.  Granular wound base.  No exposed bone muscle tendon ligament or joint.  Scant serous drainage noted.  Overall it appears very stable with a healthy granular wound base and good potential for healing.  Periwound is slightly callused and macerated  Vascular: Palpable pedal pulses bilaterally. Capillary refill within normal limits.  No erythema or edema noted to the toe today.  Clinically there is no concern for vascular compromise  Neurological: Light touch and protective threshold diminished.  Musculoskeletal Exam: Dropfoot deformity noted  right lower extremity.  Creating excessive pressure to the right great toe with ambulation  Radiographic exam RT foot 06/24/2023: Normal osseous mineralization.  No cortical erosions or irregularities that be concerning for osteomyelitis  MR TOES RIGHT WO CONTRAST 07/08/2023 IMPRESSION: 1. Soft tissue wound of the great toe. Subcortical marrow edema in the distal aspect of the first distal phalanx concerning for osteomyelitis.  Assessment: 1.  Ulcer right great toe fat layer exposed 2.  Dropfoot deformity right lower extremity 3.  Osteomyelitis left great toe  Plan of Care:  -Patient evaluated.   -Today we discussed the MRI findings in detail.  Concerning for osteomyelitis.  Discussed additional conservative modalities which would include bone biopsy and long-term IV antibiotics and referral to infectious disease versus amputation of the toe.  The patient has now dealt with this wound for about 5 years intermittently.  Fortunately he has not had the infection spread and is isolated to the toe.  After long discussion I do believe that a partial toe amputation is appropriate for the patient.  He agrees and would like to proceed with surgery.  Risk benefits advantages and disadvantages of the procedure were explained in detail to the patient.  No guarantees were expressed or implied -Authorization for surgery was initiated today.  Surgery will consist of partial left great toe amputation -Return to clinic 1 week postop  *Working at [employer]   Felecia Shelling, DPM [facility]  Dr. Felecia Shelling, DPM    [address] [place], [place] [address]                Office [phone]  Fax [phone]

## 2023-08-02 ENCOUNTER — Telehealth: Payer: Self-pay

## 2023-08-02 NOTE — Telephone Encounter (Signed)
 Received surgery paperwork from Dr. Logan Bores. Call [facility] but not able to leave you a message due to your voice mailbox being full. Sent a message through [facility] for [facility] to contact surgery scheduling.

## 2023-08-18 ENCOUNTER — Other Ambulatory Visit: Payer: Medicaid Other

## 2023-09-13 ENCOUNTER — Other Ambulatory Visit: Payer: Self-pay | Admitting: Podiatry

## 2023-09-19 ENCOUNTER — Other Ambulatory Visit: Payer: Self-pay | Admitting: *Deleted

## 2023-09-19 DIAGNOSIS — E291 Testicular hypofunction: Secondary | ICD-10-CM

## 2023-09-21 ENCOUNTER — Other Ambulatory Visit

## 2023-10-27 ENCOUNTER — Other Ambulatory Visit

## 2023-10-31 ENCOUNTER — Other Ambulatory Visit

## 2023-10-31 DIAGNOSIS — E291 Testicular hypofunction: Secondary | ICD-10-CM

## 2023-11-01 LAB — TESTOSTERONE: Testosterone: 375 ng/dL (ref 264–916)

## 2023-11-01 LAB — HEMOGLOBIN AND HEMATOCRIT, BLOOD
Hematocrit: 44.5 % (ref 37.5–51.0)
Hemoglobin: 15.2 g/dL (ref 13.0–17.7)

## 2023-11-15 ENCOUNTER — Other Ambulatory Visit: Payer: Self-pay | Admitting: Urology

## 2023-11-17 ENCOUNTER — Telehealth: Payer: Self-pay | Admitting: Urology

## 2023-11-17 ENCOUNTER — Other Ambulatory Visit: Payer: Self-pay | Admitting: *Deleted

## 2023-11-17 ENCOUNTER — Ambulatory Visit: Admitting: Urology

## 2023-11-17 NOTE — Telephone Encounter (Signed)
 Patient called to reschedule his appt today (11/17/23) due to having a bad case of poison ivy. He rescheduled for 12/12/23. He had his labs done on 10/31/23, and he is requesting refill for Testosterone  Gel be sent to pharmacy. He is asking for 1 month supply to last until he can get in to see Dr. Twylla. Pharmacy is [facility] on [address]. Please advise patient.

## 2023-11-17 NOTE — Telephone Encounter (Signed)
 Sent message to Dr. Twylla for one month refill.

## 2023-11-24 ENCOUNTER — Other Ambulatory Visit

## 2023-11-24 ENCOUNTER — Telehealth: Payer: Self-pay

## 2023-11-24 NOTE — Telephone Encounter (Signed)
 Pt LVM on the triage line stating the pharmacy needed a diagnosis code for his testosterone . I called the pharmacy to confirm. Pharmacy stated that the Rx requires a prior Auth and diagnosis code.

## 2023-11-24 NOTE — Telephone Encounter (Signed)
 Pt aware pharmacy was given the dx code.  [employer]  rejected again. Pharmacy to send over a fax with PA number.   Pt aware this may take 7-10 business days to completed. Pt voiced understanding.

## 2023-11-30 ENCOUNTER — Other Ambulatory Visit: Payer: Self-pay | Admitting: Urology

## 2023-11-30 NOTE — Telephone Encounter (Signed)
 Pt called the triage line stating he has felt a difference not being on testosterone . Pt was informed that whenever we get a PA response either our office or pharmacy we will contact him.

## 2023-12-02 ENCOUNTER — Other Ambulatory Visit: Payer: Self-pay | Admitting: Urology

## 2023-12-12 ENCOUNTER — Ambulatory Visit (INDEPENDENT_AMBULATORY_CARE_PROVIDER_SITE_OTHER): Admitting: Urology

## 2023-12-12 VITALS — BP 127/68 | HR 112 | Ht 68.0 in | Wt 220.0 lb

## 2023-12-12 DIAGNOSIS — E291 Testicular hypofunction: Secondary | ICD-10-CM | POA: Diagnosis not present

## 2023-12-12 MED ORDER — TIZANIDINE HCL 4 MG PO TABS: 4.0000 mg | ORAL_TABLET | Freq: Three times a day (TID) | ORAL | 0 refills | Status: AC | PRN

## 2023-12-12 NOTE — Progress Notes (Signed)
 12/12/23 3:51 PM   Salomon Krystal Pepper Aug 07, 1970 [id number]  Referring provider: No referring provider defined for this encounter.  Chief Complaint  Patient presents with   Follow-up   Urologic history: 1. Hypogonadism Initial visit 06/2022 with history of ED and tiredness, fatigue, and decreased libido. T-level x2 < 200 with normal LH Testosterone  gel 1.62%; 3 pumps daily  HPI: [name] is a 53 y.o. male who presents today for follow up.   Doing well on 3 pumps daily with good energy level, better mood and improved libido Labs 10/31/2023: Testosterone  375 ng/dL; H/H 84.7/55.4   PMH: Past Medical History:  Diagnosis Date   Alcohol abuse    Anemia    a. 01/2020 s/p fall w/ significant R hip hematoma-->4 u prbcs during admission.   Delirium tremens (HCC) 01/2020   Hepatic failure (HCC)    a. 01/2020 - ETOH/sepsis/rhabdo   Polysubstance abuse (HCC)    Respiratory failure (HCC)    a. 01/2020 in setting of DTs/Sepsis req intubation.   Rhabdomyolysis 01/2020   Sepsis (HCC) 01/2020    Surgical History: Past Surgical History:  Procedure Laterality Date   ESOPHAGOGASTRODUODENOSCOPY (EGD) WITH PROPOFOL  N/A 08/03/2021   Procedure: ESOPHAGOGASTRODUODENOSCOPY (EGD) WITH PROPOFOL ;  Surgeon: Maryruth Ole DASEN, MD;  Location: [facility] ENDOSCOPY;  Service: Endoscopy;  Laterality: N/A;    Home Medications:  Allergies as of 12/12/2023   No Known Allergies      Medication List        Accurate as of December 12, 2023  3:51 PM. If you have any questions, ask your nurse or doctor.          albuterol  108 (90 Base) MCG/ACT inhaler Commonly known as: VENTOLIN  HFA Inhale 2 puffs into the lungs every 6 (six) hours as needed for wheezing or shortness of breath.   aspirin  81 MG chewable tablet Chew 81 mg by mouth daily.   benzonatate  100 MG capsule Commonly known as: TESSALON  Take 1 capsule (100 mg total) by mouth 3 (three) times daily as needed for cough.   gabapentin 600  MG tablet Commonly known as: NEURONTIN Take 600 mg by mouth 2 (two) times daily.   hydrochlorothiazide  12.5 MG tablet Commonly known as: HYDRODIURIL  Take 12.5 mg by mouth daily.   lactulose  10 GM/15ML solution Commonly known as: CHRONULAC  SMARTSIG:Tablespoon By Mouth Daily   multivitamin with minerals tablet Take 1 tablet by mouth daily.   mupirocin  ointment 2 % Commonly known as: BACTROBAN  Apply topically 2 (two) times daily.   Testosterone  1.62 % Gel APPLY 3  PUMPS TOPICALLY ONCE DAILY   tiZANidine  4 MG tablet Commonly known as: ZANAFLEX  Take by mouth.         Social History:  reports that he has been smoking e-cigarettes. He has never used smokeless tobacco. He reports that he does not currently use alcohol. He reports that he does not currently use drugs.   Physical Exam: BP 127/68   Pulse (!) 112   Ht 5' 8 (1.727 m)   Wt 220 lb (99.8 kg)   BMI 33.45 kg/m   Constitutional:  Alert and oriented, No acute distress. HEENT: [place] AT Psychiatric: Normal mood and affect.   Assessment & Plan:    1. Hypogonadism Significant improvement in symptoms on generic androgel  1.62%, 3 pumps daily Lab visit 6 months testosterone , H/H, PSA Office visit 1 year testosterone , H/H  He is presently out of Zanaflex  prescribed by Dr. Lane and ask if I would refill until  he can get an appointment to see him.  A 1 month supply was sent   Glendia JAYSON Barba, MD  [facility] [address], [address] [place], [place] [address] [phone]

## 2023-12-18 ENCOUNTER — Encounter: Payer: Self-pay | Admitting: Urology

## 2024-01-14 ENCOUNTER — Other Ambulatory Visit: Payer: Self-pay | Admitting: Urology

## 2024-03-11 ENCOUNTER — Encounter: Payer: Self-pay | Admitting: Podiatry

## 2024-04-27 ENCOUNTER — Telehealth: Payer: Self-pay | Admitting: Podiatry

## 2024-04-27 ENCOUNTER — Encounter: Payer: Self-pay | Admitting: Podiatry

## 2024-04-27 NOTE — Telephone Encounter (Signed)
 Had multiple faxes for SS[employer] forms per Dr Janit he would like to see the patient for an appointment. Called number in chart number not in service. Letter sent.

## 2024-05-10 ENCOUNTER — Other Ambulatory Visit: Payer: Self-pay

## 2024-05-10 DIAGNOSIS — N138 Other obstructive and reflux uropathy: Secondary | ICD-10-CM

## 2024-05-10 DIAGNOSIS — E291 Testicular hypofunction: Secondary | ICD-10-CM

## 2024-05-14 ENCOUNTER — Other Ambulatory Visit

## 2024-05-14 DIAGNOSIS — N138 Other obstructive and reflux uropathy: Secondary | ICD-10-CM

## 2024-05-14 DIAGNOSIS — E291 Testicular hypofunction: Secondary | ICD-10-CM

## 2024-05-15 ENCOUNTER — Encounter: Payer: Self-pay | Admitting: Podiatry

## 2024-05-15 ENCOUNTER — Ambulatory Visit (INDEPENDENT_AMBULATORY_CARE_PROVIDER_SITE_OTHER): Admitting: Podiatry

## 2024-05-15 VITALS — Ht 68.0 in | Wt 220.0 lb

## 2024-05-15 DIAGNOSIS — M869 Osteomyelitis, unspecified: Secondary | ICD-10-CM | POA: Diagnosis not present

## 2024-05-15 DIAGNOSIS — L97512 Non-pressure chronic ulcer of other part of right foot with fat layer exposed: Secondary | ICD-10-CM

## 2024-05-15 LAB — HEMOGLOBIN AND HEMATOCRIT, BLOOD
Hematocrit: 40.4 % (ref 37.5–51.0)
Hemoglobin: 13.7 g/dL (ref 13.0–17.7)

## 2024-05-15 LAB — TESTOSTERONE: Testosterone: 508 ng/dL (ref 264–916)

## 2024-05-15 LAB — PSA: Prostate Specific Ag, Serum: 0.2 ng/mL (ref 0.0–4.0)

## 2024-05-15 MED ORDER — GENTAMICIN SULFATE 0.1 % EX CREA: 1.0000 | TOPICAL_CREAM | Freq: Two times a day (BID) | CUTANEOUS | 1 refills | Status: AC

## 2024-05-22 ENCOUNTER — Other Ambulatory Visit: Payer: Self-pay | Admitting: Urology

## 2024-05-23 NOTE — Progress Notes (Signed)
 "  Chief Complaint  Patient presents with   Wound Check    Pt is here to f/u on left foot great toe due to ulcer, he states that the foot is healing well, no pain.    HPI: 53 y.o. male presenting today for follow-up evaluation of an ulcer to the right great toe.  MRI completed since last visit.  Presenting today to review the MRI results and discuss further treatment options  Past Medical History:  Diagnosis Date   Alcohol abuse    Anemia    a. 01/2020 s/p fall w/ significant R hip hematoma-->4 u prbcs during admission.   Delirium tremens (HCC) 01/2020   Hepatic failure (HCC)    a. 01/2020 - ETOH/sepsis/rhabdo   Polysubstance abuse (HCC)    Respiratory failure (HCC)    a. 01/2020 in setting of DTs/Sepsis req intubation.   Rhabdomyolysis 01/2020   Sepsis (HCC) 01/2020    Past Surgical History:  Procedure Laterality Date   ESOPHAGOGASTRODUODENOSCOPY (EGD) WITH PROPOFOL  N/A 08/03/2021   Procedure: ESOPHAGOGASTRODUODENOSCOPY (EGD) WITH PROPOFOL ;  Surgeon: Maryruth Ole DASEN, MD;  Location: [facility] ENDOSCOPY;  Service: Endoscopy;  Laterality: N/A;    No Known Allergies   06/24/2023 RT great toe   RT great toe 05/15/2024  Physical Exam: General: The patient is alert and oriented x3 in no acute distress.  Dermatology: Chronic ulcer noted to the right great toe measuring approximately 1.2 x 1.0 x 0.3 cm.  Granular wound base.  No exposed bone muscle tendon ligament or joint.  Scant serous drainage noted.  Overall it appears very stable with a healthy granular wound base and good potential for healing.  Periwound is slightly callused and macerated  Vascular: Palpable pedal pulses bilaterally. Capillary refill within normal limits.  No erythema or edema noted to the toe today.  Clinically there is no concern for vascular compromise  Neurological: Light touch and protective threshold diminished.  Musculoskeletal Exam: Dropfoot deformity noted right lower extremity.  Creating excessive  pressure to the right great toe with ambulation  Radiographic exam RT foot 06/24/2023: Normal osseous mineralization.  No cortical erosions or irregularities that be concerning for osteomyelitis  MR TOES RIGHT WO CONTRAST 07/08/2023 IMPRESSION: 1. Soft tissue wound of the great toe. Subcortical marrow edema in the distal aspect of the first distal phalanx concerning for osteomyelitis.  Assessment: 1.  Ulcer right great toe fat layer exposed 2.  Dropfoot deformity right lower extremity 3.  Osteomyelitis left great toe  Plan of Care:  -Patient evaluated.  Declined updated x-rays today -Continue concern for osteomyelitis to the right great toe was discussed however the patient declined any surgical intervention at this time -Medically necessary excisional debridement including subcutaneous tissue was performed today using a tissue nipper.  Excisional debridement of the necrotic nonviable tissue down to healthier bleeding viable tissue was performed with postdebridement measurements same as pre-. - Last visit we had planned for partial toe amputation of the great toe however there has been improvement and the patient states that his toe is healing well and does not want to pursue surgery -Paperwork for disability claim was excepted today for completion -Refill prescription for gentamicin  cream -Return to clinic 6 weeks follow-up x-rays   Thresa EMERSON Sar, DPM [facility]  Dr. Thresa EMERSON Sar, DPM    [address]  [place], [place] [address]                Office [phone]  Fax [phone]   "

## 2024-05-29 ENCOUNTER — Other Ambulatory Visit: Payer: Self-pay | Admitting: Urology

## 2024-06-26 ENCOUNTER — Ambulatory Visit: Admitting: Podiatry

## 2024-06-27 ENCOUNTER — Inpatient Hospital Stay (HOSPITAL_COMMUNITY)
Admission: EM | Admit: 2024-06-27 | Source: Home / Self Care | Attending: Internal Medicine | Admitting: Internal Medicine

## 2024-06-27 ENCOUNTER — Emergency Department (HOSPITAL_COMMUNITY)

## 2024-06-27 ENCOUNTER — Other Ambulatory Visit: Payer: Self-pay

## 2024-06-27 DIAGNOSIS — L089 Local infection of the skin and subcutaneous tissue, unspecified: Principal | ICD-10-CM

## 2024-06-27 DIAGNOSIS — M869 Osteomyelitis, unspecified: Secondary | ICD-10-CM | POA: Diagnosis present

## 2024-06-27 DIAGNOSIS — R7989 Other specified abnormal findings of blood chemistry: Secondary | ICD-10-CM | POA: Diagnosis present

## 2024-06-27 DIAGNOSIS — L97519 Non-pressure chronic ulcer of other part of right foot with unspecified severity: Secondary | ICD-10-CM | POA: Diagnosis present

## 2024-06-27 DIAGNOSIS — D696 Thrombocytopenia, unspecified: Secondary | ICD-10-CM | POA: Diagnosis present

## 2024-06-27 DIAGNOSIS — I480 Paroxysmal atrial fibrillation: Secondary | ICD-10-CM | POA: Diagnosis present

## 2024-06-27 DIAGNOSIS — F1110 Opioid abuse, uncomplicated: Secondary | ICD-10-CM | POA: Diagnosis present

## 2024-06-27 DIAGNOSIS — K625 Hemorrhage of anus and rectum: Secondary | ICD-10-CM

## 2024-06-27 DIAGNOSIS — K7030 Alcoholic cirrhosis of liver without ascites: Secondary | ICD-10-CM | POA: Diagnosis present

## 2024-06-27 DIAGNOSIS — G629 Polyneuropathy, unspecified: Secondary | ICD-10-CM

## 2024-06-27 DIAGNOSIS — F1020 Alcohol dependence, uncomplicated: Secondary | ICD-10-CM | POA: Diagnosis present

## 2024-06-27 LAB — CBC WITH DIFFERENTIAL/PLATELET
Abs Immature Granulocytes: 0.02 10*3/uL (ref 0.00–0.07)
Basophils Absolute: 0.1 10*3/uL (ref 0.0–0.1)
Basophils Relative: 1 %
Eosinophils Absolute: 0.1 10*3/uL (ref 0.0–0.5)
Eosinophils Relative: 1 %
HCT: 44.8 % (ref 39.0–52.0)
Hemoglobin: 15.2 g/dL (ref 13.0–17.0)
Immature Granulocytes: 0 %
Lymphocytes Relative: 21 %
Lymphs Abs: 1.3 10*3/uL (ref 0.7–4.0)
MCH: 33.5 pg (ref 26.0–34.0)
MCHC: 33.9 g/dL (ref 30.0–36.0)
MCV: 98.7 fL (ref 80.0–100.0)
Monocytes Absolute: 0.8 10*3/uL (ref 0.1–1.0)
Monocytes Relative: 13 %
Neutro Abs: 3.9 10*3/uL (ref 1.7–7.7)
Neutrophils Relative %: 64 %
Platelets: 142 10*3/uL — ABNORMAL LOW (ref 150–400)
RBC: 4.54 MIL/uL (ref 4.22–5.81)
RDW: 13.7 % (ref 11.5–15.5)
WBC: 6.1 10*3/uL (ref 4.0–10.5)
nRBC: 0 % (ref 0.0–0.2)

## 2024-06-27 LAB — URINALYSIS, W/ REFLEX TO CULTURE (INFECTION SUSPECTED)
Bacteria, UA: NONE SEEN
Bilirubin Urine: NEGATIVE
Glucose, UA: NEGATIVE mg/dL
Hgb urine dipstick: NEGATIVE
Ketones, ur: NEGATIVE mg/dL
Leukocytes,Ua: NEGATIVE
Nitrite: NEGATIVE
Protein, ur: NEGATIVE mg/dL
Specific Gravity, Urine: 1.012 (ref 1.005–1.030)
pH: 9 — ABNORMAL HIGH (ref 5.0–8.0)

## 2024-06-27 LAB — I-STAT CG4 LACTIC ACID, ED: Lactic Acid, Venous: 1.3 mmol/L (ref 0.5–1.9)

## 2024-06-27 NOTE — ED Triage Notes (Addendum)
 [facility] [employer] [facility], pt c/o R foot/toe pain and reports having significant drainage today. Pt does have open wound on the R great toe, that wound care doctor has been following.   [employer] remaining at the bedside due pt being in custody
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
## 2024-06-28 ENCOUNTER — Inpatient Hospital Stay (HOSPITAL_COMMUNITY)

## 2024-06-28 DIAGNOSIS — F1110 Opioid abuse, uncomplicated: Secondary | ICD-10-CM | POA: Diagnosis not present

## 2024-06-28 DIAGNOSIS — G6289 Other specified polyneuropathies: Secondary | ICD-10-CM | POA: Diagnosis not present

## 2024-06-28 DIAGNOSIS — G629 Polyneuropathy, unspecified: Secondary | ICD-10-CM

## 2024-06-28 DIAGNOSIS — R7989 Other specified abnormal findings of blood chemistry: Secondary | ICD-10-CM | POA: Diagnosis present

## 2024-06-28 DIAGNOSIS — M869 Osteomyelitis, unspecified: Secondary | ICD-10-CM | POA: Diagnosis not present

## 2024-06-28 DIAGNOSIS — L97512 Non-pressure chronic ulcer of other part of right foot with fat layer exposed: Secondary | ICD-10-CM | POA: Diagnosis not present

## 2024-06-28 DIAGNOSIS — I480 Paroxysmal atrial fibrillation: Secondary | ICD-10-CM | POA: Diagnosis not present

## 2024-06-28 DIAGNOSIS — L98499 Non-pressure chronic ulcer of skin of other sites with unspecified severity: Secondary | ICD-10-CM

## 2024-06-28 DIAGNOSIS — F1020 Alcohol dependence, uncomplicated: Secondary | ICD-10-CM | POA: Diagnosis not present

## 2024-06-28 DIAGNOSIS — K7030 Alcoholic cirrhosis of liver without ascites: Secondary | ICD-10-CM | POA: Diagnosis not present

## 2024-06-28 DIAGNOSIS — D696 Thrombocytopenia, unspecified: Secondary | ICD-10-CM | POA: Diagnosis present

## 2024-06-28 DIAGNOSIS — L97519 Non-pressure chronic ulcer of other part of right foot with unspecified severity: Secondary | ICD-10-CM | POA: Diagnosis present

## 2024-06-28 LAB — I-STAT CG4 LACTIC ACID, ED: Lactic Acid, Venous: 0.9 mmol/L (ref 0.5–1.9)

## 2024-06-28 LAB — COMPREHENSIVE METABOLIC PANEL WITH GFR
ALT: 69 U/L — ABNORMAL HIGH (ref 0–44)
AST: 175 U/L — ABNORMAL HIGH (ref 15–41)
Albumin: 3.9 g/dL (ref 3.5–5.0)
Alkaline Phosphatase: 100 U/L (ref 38–126)
Anion gap: 13 (ref 5–15)
BUN: 11 mg/dL (ref 6–20)
CO2: 26 mmol/L (ref 22–32)
Calcium: 9.7 mg/dL (ref 8.9–10.3)
Chloride: 101 mmol/L (ref 98–111)
Creatinine, Ser: 0.69 mg/dL (ref 0.61–1.24)
GFR, Estimated: >60 mL/min
Glucose, Bld: 84 mg/dL (ref 70–99)
Potassium: 4.9 mmol/L (ref 3.5–5.1)
Sodium: 139 mmol/L (ref 135–145)
Total Bilirubin: 2.6 mg/dL — ABNORMAL HIGH (ref 0.0–1.2)
Total Protein: 7.5 g/dL (ref 6.5–8.1)

## 2024-06-28 LAB — HIV ANTIBODY (ROUTINE TESTING W REFLEX): HIV Screen 4th Generation wRfx: NONREACTIVE

## 2024-06-28 LAB — HEMOGLOBIN A1C
Hgb A1c MFr Bld: 5.1 % (ref 4.8–5.6)
Mean Plasma Glucose: 99.67 mg/dL

## 2024-06-28 LAB — SEDIMENTATION RATE: Sed Rate: 17 mm/h — ABNORMAL HIGH (ref 0–16)

## 2024-06-28 LAB — C-REACTIVE PROTEIN: CRP: <0.5 mg/dL

## 2024-06-28 MED ORDER — ENOXAPARIN SODIUM 40 MG/0.4ML IJ SOSY
40.0000 mg | PREFILLED_SYRINGE | INTRAMUSCULAR | Status: DC
  Administered 2024-06-28: 40 mg via SUBCUTANEOUS
  Filled 2024-06-28: qty 0.4

## 2024-06-28 MED ORDER — SODIUM CHLORIDE 0.9 % IV SOLN
3.0000 g | Freq: Once | INTRAVENOUS | Status: AC
  Administered 2024-06-28: 3 g via INTRAVENOUS
  Filled 2024-06-28: qty 8

## 2024-06-28 MED ORDER — LINEZOLID 600 MG PO TABS
600.0000 mg | ORAL_TABLET | Freq: Once | ORAL | Status: AC
  Administered 2024-06-28: 600 mg via ORAL
  Filled 2024-06-28: qty 1

## 2024-06-28 MED ORDER — FOLIC ACID 1 MG PO TABS
1.0000 mg | ORAL_TABLET | Freq: Every day | ORAL | Status: AC
  Administered 2024-06-29: 1 mg via ORAL
  Filled 2024-06-28: qty 1

## 2024-06-28 MED ORDER — THIAMINE HCL 100 MG/ML IJ SOLN: 100.0000 mg | Freq: Every day | INTRAMUSCULAR | Status: AC

## 2024-06-28 MED ORDER — ONDANSETRON HCL 4 MG PO TABS: 4.0000 mg | ORAL_TABLET | Freq: Four times a day (QID) | ORAL | Status: AC | PRN

## 2024-06-28 MED ORDER — SODIUM CHLORIDE 0.9 % IV SOLN
3.0000 g | Freq: Four times a day (QID) | INTRAVENOUS | Status: AC
  Administered 2024-06-28 – 2024-06-29 (×5): 3 g via INTRAVENOUS
  Filled 2024-06-28 (×5): qty 8

## 2024-06-28 MED ORDER — HYDROCHLOROTHIAZIDE 12.5 MG PO TABS
12.5000 mg | ORAL_TABLET | Freq: Every day | ORAL | Status: AC
  Administered 2024-06-29: 12.5 mg via ORAL
  Filled 2024-06-28: qty 1

## 2024-06-28 MED ORDER — ACETAMINOPHEN 325 MG PO TABS
650.0000 mg | ORAL_TABLET | Freq: Four times a day (QID) | ORAL | Status: AC | PRN
  Filled 2024-06-28: qty 2

## 2024-06-28 MED ORDER — GABAPENTIN 600 MG PO TABS: 600.0000 mg | ORAL_TABLET | Freq: Two times a day (BID) | ORAL | Status: DC

## 2024-06-28 MED ORDER — TIZANIDINE HCL 4 MG PO TABS
4.0000 mg | ORAL_TABLET | Freq: Once | ORAL | Status: AC
  Administered 2024-06-28: 4 mg via ORAL
  Filled 2024-06-28: qty 1

## 2024-06-28 MED ORDER — LORAZEPAM 1 MG PO TABS: 1.0000 mg | ORAL_TABLET | ORAL | Status: AC | PRN

## 2024-06-28 MED ORDER — GABAPENTIN 400 MG PO CAPS
400.0000 mg | ORAL_CAPSULE | Freq: Two times a day (BID) | ORAL | Status: AC
  Administered 2024-06-28 – 2024-06-29 (×3): 400 mg via ORAL
  Filled 2024-06-28 (×3): qty 1

## 2024-06-28 MED ORDER — THIAMINE MONONITRATE 100 MG PO TABS
100.0000 mg | ORAL_TABLET | Freq: Every day | ORAL | Status: AC
  Administered 2024-06-29: 100 mg via ORAL
  Filled 2024-06-28: qty 1

## 2024-06-28 MED ORDER — METHADONE HCL 10 MG PO TABS: 95.0000 mg | ORAL_TABLET | Freq: Every day | ORAL | Status: AC

## 2024-06-28 MED ORDER — ALBUTEROL SULFATE (2.5 MG/3ML) 0.083% IN NEBU: 2.5000 mg | INHALATION_SOLUTION | Freq: Four times a day (QID) | RESPIRATORY_TRACT | Status: AC | PRN

## 2024-06-28 MED ORDER — ASPIRIN 81 MG PO CHEW
81.0000 mg | CHEWABLE_TABLET | Freq: Every day | ORAL | Status: AC
  Administered 2024-06-29: 81 mg via ORAL
  Filled 2024-06-28: qty 1

## 2024-06-28 MED ORDER — ACETAMINOPHEN 650 MG RE SUPP: 650.0000 mg | Freq: Four times a day (QID) | RECTAL | Status: AC | PRN

## 2024-06-28 MED ORDER — LORAZEPAM 2 MG/ML IJ SOLN: 1.0000 mg | INTRAMUSCULAR | Status: AC | PRN

## 2024-06-28 MED ORDER — PANTOPRAZOLE SODIUM 40 MG IV SOLR
40.0000 mg | Freq: Two times a day (BID) | INTRAVENOUS | Status: AC
  Administered 2024-06-29 (×3): 40 mg via INTRAVENOUS
  Filled 2024-06-28 (×3): qty 10

## 2024-06-28 MED ORDER — GABAPENTIN 300 MG PO CAPS
600.0000 mg | ORAL_CAPSULE | Freq: Once | ORAL | Status: AC
  Administered 2024-06-28: 600 mg via ORAL
  Filled 2024-06-28: qty 2

## 2024-06-28 MED ORDER — ADULT MULTIVITAMIN W/MINERALS CH
1.0000 | ORAL_TABLET | Freq: Every day | ORAL | Status: AC
  Administered 2024-06-29: 1 via ORAL
  Filled 2024-06-28: qty 1

## 2024-06-28 MED ORDER — SODIUM CHLORIDE 0.9% FLUSH
3.0000 mL | Freq: Two times a day (BID) | INTRAVENOUS | Status: AC
  Administered 2024-06-28 – 2024-06-29 (×2): 3 mL via INTRAVENOUS

## 2024-06-28 MED ORDER — METHADONE HCL 5 MG PO TABS
95.0000 mg | ORAL_TABLET | Freq: Once | ORAL | Status: AC
  Administered 2024-06-28: 95 mg via ORAL
  Filled 2024-06-28: qty 1

## 2024-06-28 MED ORDER — ONDANSETRON HCL 4 MG/2ML IJ SOLN: 4.0000 mg | Freq: Four times a day (QID) | INTRAMUSCULAR | Status: AC | PRN

## 2024-06-28 MED ORDER — LINEZOLID 600 MG/300ML IV SOLN
600.0000 mg | Freq: Two times a day (BID) | INTRAVENOUS | Status: AC
  Administered 2024-06-28 – 2024-06-29 (×2): 600 mg via INTRAVENOUS
  Filled 2024-06-28 (×4): qty 300

## 2024-06-28 NOTE — ED Provider Notes (Incomplete)
 " [place] EMERGENCY DEPARTMENT AT [facility] Provider Note   CSN: [id number] Arrival date & time: 06/27/24  2030     Patient presents with: Foot Pain   [name] is a 54 y.o. male.  {Add pertinent medical, surgical, social history, OB history to HPI:32947}  Foot Pain       Prior to Admission medications  Medication Sig Start Date End Date Taking? Authorizing Provider  albuterol  (VENTOLIN  HFA) 108 (90 Base) MCG/ACT inhaler Inhale 2 puffs into the lungs every 6 (six) hours as needed for wheezing or shortness of breath. 07/27/23   Gladis Elsie BROCKS, PA-C  aspirin  81 MG chewable tablet Chew 81 mg by mouth daily.    [provider]  benzonatate  (TESSALON ) 100 MG capsule Take 1 capsule (100 mg total) by mouth 3 (three) times daily as needed for cough. 07/27/23   Gladis Elsie BROCKS, PA-C  gabapentin  (NEURONTIN ) 600 MG tablet Take 600 mg by mouth 2 (two) times daily. 07/14/23   [provider]  gentamicin  cream (GARAMYCIN ) 0.1 % Apply 1 Application topically 2 (two) times daily. 05/15/24   Janit Thresa HERO, DPM  hydrochlorothiazide  (HYDRODIURIL ) 12.5 MG tablet Take 12.5 mg by mouth daily.    [provider]  lactulose  (CHRONULAC ) 10 GM/15ML solution SMARTSIG:Tablespoon By Mouth Daily 07/06/21   [provider]  Multiple Vitamins-Minerals (MULTIVITAMIN WITH MINERALS) tablet Take 1 tablet by mouth daily.    [provider]  mupirocin  ointment (BACTROBAN ) 2 % Apply topically 2 (two) times daily.    [provider]  Testosterone  1.62 % GEL APPLY 3 PUMPS TOPICALLY ONCE DAILY 01/24/24   Stoioff, Glendia BROCKS, MD  tiZANidine  (ZANAFLEX ) 4 MG tablet Take 1 tablet (4 mg total) by mouth every 8 (eight) hours as needed for muscle spasms. 12/12/23   Stoioff, Glendia BROCKS, MD    Allergies: Patient has no known allergies.    Review of Systems  Updated Vital Signs BP 130/71 (BP Location: Right Arm)   Pulse 81   Temp 98.6 F (37 C)   Resp 18    Ht 5' 9 (1.753 m)   Wt 61.2 kg   SpO2 96%   BMI 19.94 kg/m   Physical Exam  (all labs ordered are listed, but only abnormal results are displayed) Labs Reviewed  COMPREHENSIVE METABOLIC PANEL WITH GFR - Abnormal; Notable for the following components:      Result Value   AST 175 (*)    ALT 69 (*)    Total Bilirubin 2.6 (*)    All other components within normal limits  CBC WITH DIFFERENTIAL/PLATELET - Abnormal; Notable for the following components:   Platelets 142 (*)    All other components within normal limits  URINALYSIS, W/ REFLEX TO CULTURE (INFECTION SUSPECTED) - Abnormal; Notable for the following components:   pH 9.0 (*)    All other components within normal limits  I-STAT CG4 LACTIC ACID, ED  I-STAT CG4 LACTIC ACID, ED  I-STAT CG4 LACTIC ACID, ED    EKG: None  Radiology: DG Foot Complete Right Result Date: 06/27/2024 CLINICAL DATA:  Right great toe wound concerning for osteomyelitis EXAM: RIGHT FOOT COMPLETE - 3+ VIEW COMPARISON:  06/24/2023 FINDINGS: Frontal, oblique, lateral views of the right foot are obtained. There are no acute displaced fractures. Marked soft tissue swelling and ulceration along the medial aspect of the first digit. There is no underlying bony destruction or periosteal reaction to suggest osteomyelitis. Mild diffuse osteoarthritis. Diffuse soft tissue swelling  throughout the forefoot, midfoot, and hindfoot. IMPRESSION: 1. Soft tissue swelling and ulceration of the first digit, with no radiographic evidence of acute osteomyelitis. 2. Diffuse soft tissue swelling. 3. Osteoarthritis. Electronically Signed   By: Ozell Daring M.D.   On: 06/27/2024 22:08    {Document cardiac monitor, telemetry assessment procedure when appropriate:32947} Procedures   Medications Ordered in the ED  tiZANidine  (ZANAFLEX ) tablet 4 mg (has no administration in time range)  gabapentin  (NEURONTIN ) capsule 600 mg (has no administration in time range)  methadone  (DOLOPHINE )  tablet 95 mg (has no administration in time range)      {Click here for ABCD2, HEART and other calculators REFRESH Note before signing:1}                              Medical Decision Making Amount and/or Complexity of Data Reviewed Labs: ordered.   ***  {Document critical care time when appropriate  Document review of labs and clinical decision tools ie CHADS2VASC2, etc  Document your independent review of radiology images and any outside records  Document your discussion with family members, caretakers and with consultants  Document social determinants of health affecting pt's care  Document your decision making why or why not admission, treatments were needed:32947:::1}   Final diagnoses:  None    ED Discharge Orders     None        "

## 2024-06-28 NOTE — Progress Notes (Signed)
 Pt seen prior to MRI.  Discussed concern for osteomyelitis of R hallux on XR, MRI now also evdience of OM. Plan for R hallux amputation tomorrow.   He was in agreement with this plan, understand need for toe amp, discussed NPO after MN, he had no concerns.   Plan for  R hallux amputation tmrw and then likely stable for dc later Friday or Saturday.   Full consult to follow tmrw in pre op        [name], DPM [facility] / [facility]                   06/28/2024

## 2024-06-28 NOTE — Progress Notes (Signed)
 Ankle Brachial Index has been completed.  Results can be found in chart review under CV Proc.  06/28/2024 6:50 PM  Kye Hedden Elden Appl, RVT.
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
## 2024-06-28 NOTE — Progress Notes (Signed)
" ° ° °  PROCEDURAL EXPEDITER PROGRESS NOTE  Patient Name: [name]  DOB:[birth year] Date of Admission: 06/27/2024  Date of Assessment:06/28/24   -------------------------------------------------------------------------------------------------------------------   Brief clinical summary: patient going to OR on 06-29-24 for right great toe amputation  Orders in place:   Yes   Communication with surgical team if no orders:  none  Labs, test, and orders reviewed:  yes  Requires surgical clearance:   No  Barriers noted: none   Intervention provided by [facility] team:  n/a  Barrier resolved:   not applicable   -------------------------------------------------------------------------------------------------------------------  [facility] Patient Care Command Expediter, Rexene LITTIE Kirks Please contact us  directly via secure chat (search for [facility]) or by calling us  at [phone] [facility]).  "

## 2024-06-29 ENCOUNTER — Inpatient Hospital Stay (HOSPITAL_COMMUNITY): Admitting: Anesthesiology

## 2024-06-29 ENCOUNTER — Encounter (HOSPITAL_COMMUNITY): Admission: EM | Payer: Self-pay | Source: Home / Self Care | Attending: Internal Medicine

## 2024-06-29 ENCOUNTER — Encounter (HOSPITAL_COMMUNITY): Payer: Self-pay | Admitting: Internal Medicine

## 2024-06-29 ENCOUNTER — Other Ambulatory Visit: Payer: Self-pay

## 2024-06-29 ENCOUNTER — Inpatient Hospital Stay (HOSPITAL_COMMUNITY)

## 2024-06-29 DIAGNOSIS — K625 Hemorrhage of anus and rectum: Secondary | ICD-10-CM

## 2024-06-29 LAB — CBC
HCT: 41.4 % (ref 39.0–52.0)
Hemoglobin: 14.3 g/dL (ref 13.0–17.0)
MCH: 33.8 pg (ref 26.0–34.0)
MCHC: 34.5 g/dL (ref 30.0–36.0)
MCV: 97.9 fL (ref 80.0–100.0)
Platelets: 132 10*3/uL — ABNORMAL LOW (ref 150–400)
RBC: 4.23 MIL/uL (ref 4.22–5.81)
RDW: 13.8 % (ref 11.5–15.5)
WBC: 5.4 10*3/uL (ref 4.0–10.5)
nRBC: 0 % (ref 0.0–0.2)

## 2024-06-29 LAB — PHOSPHORUS: Phosphorus: 3.8 mg/dL (ref 2.5–4.6)

## 2024-06-29 LAB — PRO BRAIN NATRIURETIC PEPTIDE: Pro Brain Natriuretic Peptide: 67.3 pg/mL

## 2024-06-29 LAB — BASIC METABOLIC PANEL WITH GFR
Anion gap: 8 (ref 5–15)
BUN: 14 mg/dL (ref 6–20)
CO2: 28 mmol/L (ref 22–32)
Calcium: 8.7 mg/dL — ABNORMAL LOW (ref 8.9–10.3)
Chloride: 100 mmol/L (ref 98–111)
Creatinine, Ser: 0.78 mg/dL (ref 0.61–1.24)
GFR, Estimated: >60 mL/min
Glucose, Bld: 103 mg/dL — ABNORMAL HIGH (ref 70–99)
Potassium: 4.2 mmol/L (ref 3.5–5.1)
Sodium: 136 mmol/L (ref 135–145)

## 2024-06-29 LAB — AEROBIC/ANAEROBIC CULTURE W GRAM STAIN (SURGICAL/DEEP WOUND)

## 2024-06-29 LAB — VAS US ABI WITH/WO TBI
Left ABI: 1.23
Right ABI: 1.28

## 2024-06-29 LAB — MAGNESIUM: Magnesium: 1.6 mg/dL — ABNORMAL LOW (ref 1.7–2.4)

## 2024-06-29 MED ORDER — POLYETHYLENE GLYCOL 3350 17 G PO PACK
34.0000 g | PACK | Freq: Once | ORAL | Status: AC
  Administered 2024-06-29: 34 g via ORAL
  Filled 2024-06-29: qty 2

## 2024-06-29 MED ORDER — SODIUM CHLORIDE 0.9 % IV SOLN: INTRAVENOUS | Status: AC

## 2024-06-29 MED ORDER — CHLORHEXIDINE GLUCONATE 0.12 % MT SOLN
15.0000 mL | Freq: Once | OROMUCOSAL | Status: AC
  Administered 2024-06-29: 15 mL via OROMUCOSAL

## 2024-06-29 MED ORDER — 0.9 % SODIUM CHLORIDE (POUR BTL) OPTIME: TOPICAL | Status: DC | PRN

## 2024-06-29 MED ORDER — OXYCODONE HCL 5 MG PO TABS
10.0000 mg | ORAL_TABLET | Freq: Four times a day (QID) | ORAL | Status: AC | PRN
  Administered 2024-06-29: 10 mg via ORAL
  Filled 2024-06-29: qty 2

## 2024-06-29 MED ORDER — BISACODYL 5 MG PO TBEC
10.0000 mg | DELAYED_RELEASE_TABLET | Freq: Once | ORAL | Status: AC
  Administered 2024-06-29: 10 mg via ORAL
  Filled 2024-06-29: qty 2

## 2024-06-29 MED ORDER — PROPOFOL 10 MG/ML IV BOLUS: INTRAVENOUS | Status: DC | PRN

## 2024-06-29 MED ORDER — MIDAZOLAM HCL 2 MG/2ML IJ SOLN
INTRAMUSCULAR | Status: AC
  Filled 2024-06-29: qty 2

## 2024-06-29 MED ORDER — NA SULFATE-K SULFATE-MG SULF 17.5-3.13-1.6 GM/177ML PO SOLN: 0.5000 | Freq: Once | ORAL | Status: AC

## 2024-06-29 MED ORDER — FENTANYL CITRATE (PF) 100 MCG/2ML IJ SOLN: 25.0000 ug | INTRAMUSCULAR | Status: DC | PRN

## 2024-06-29 MED ORDER — OXYCODONE HCL 5 MG PO TABS: 5.0000 mg | ORAL_TABLET | Freq: Four times a day (QID) | ORAL | Status: DC | PRN

## 2024-06-29 MED ORDER — NA SULFATE-K SULFATE-MG SULF 17.5-3.13-1.6 GM/177ML PO SOLN
0.5000 | Freq: Once | ORAL | Status: AC
  Filled 2024-06-29: qty 1

## 2024-06-29 MED ORDER — OXYCODONE HCL 5 MG PO TABS: 5.0000 mg | ORAL_TABLET | Freq: Four times a day (QID) | ORAL | Status: AC | PRN

## 2024-06-29 MED ORDER — DROPERIDOL 2.5 MG/ML IJ SOLN: 0.6250 mg | Freq: Once | INTRAMUSCULAR | Status: DC | PRN

## 2024-06-29 MED ORDER — OXYCODONE HCL 5 MG PO TABS: 5.0000 mg | ORAL_TABLET | Freq: Once | ORAL | Status: DC | PRN

## 2024-06-29 MED ORDER — ONDANSETRON HCL 4 MG/2ML IJ SOLN
INTRAMUSCULAR | Status: DC | PRN
  Administered 2024-06-29: 4 mg via INTRAVENOUS

## 2024-06-29 MED ORDER — SODIUM CHLORIDE 0.9 % IR SOLN
Status: DC | PRN
  Administered 2024-06-29: 1000 mL

## 2024-06-29 MED ORDER — MAGNESIUM OXIDE -MG SUPPLEMENT 400 (240 MG) MG PO TABS
400.0000 mg | ORAL_TABLET | Freq: Two times a day (BID) | ORAL | Status: AC
  Administered 2024-06-29 (×2): 400 mg via ORAL
  Filled 2024-06-29: qty 1

## 2024-06-29 MED ORDER — ORAL CARE MOUTH RINSE: 15.0000 mL | Freq: Once | OROMUCOSAL | Status: AC

## 2024-06-29 MED ORDER — MIDAZOLAM HCL (PF) 2 MG/2ML IJ SOLN
INTRAMUSCULAR | Status: DC | PRN
  Administered 2024-06-29: 2 mg via INTRAVENOUS

## 2024-06-29 MED ORDER — PROPOFOL 500 MG/50ML IV EMUL
INTRAVENOUS | Status: DC | PRN
  Administered 2024-06-29: 200 ug/kg/min via INTRAVENOUS
  Administered 2024-06-29: 225 ug/kg/min via INTRAVENOUS

## 2024-06-29 MED ORDER — POLYETHYLENE GLYCOL 3350 17 G PO PACK: 34.0000 g | PACK | Freq: Once | ORAL | Status: AC

## 2024-06-29 MED ORDER — LIDOCAINE HCL (PF) 1 % IJ SOLN
INTRAMUSCULAR | Status: DC | PRN
  Administered 2024-06-29: 10 mL

## 2024-06-29 MED ORDER — LACTATED RINGERS IV SOLN: INTRAVENOUS | Status: DC

## 2024-06-29 MED ORDER — TIZANIDINE HCL 4 MG PO TABS
4.0000 mg | ORAL_TABLET | Freq: Every evening | ORAL | Status: AC | PRN
  Administered 2024-06-29: 4 mg via ORAL
  Filled 2024-06-29: qty 1

## 2024-06-29 MED ORDER — METHADONE HCL 10 MG PO TABS
95.0000 mg | ORAL_TABLET | Freq: Once | ORAL | Status: AC
  Administered 2024-06-29: 95 mg via ORAL
  Filled 2024-06-29: qty 10

## 2024-06-29 MED ORDER — FENTANYL CITRATE (PF) 100 MCG/2ML IJ SOLN
INTRAMUSCULAR | Status: AC
  Filled 2024-06-29: qty 2

## 2024-06-29 MED ORDER — FENTANYL CITRATE (PF) 100 MCG/2ML IJ SOLN
INTRAMUSCULAR | Status: DC | PRN
  Administered 2024-06-29: 50 ug via INTRAVENOUS

## 2024-06-29 MED ORDER — OXYCODONE HCL 5 MG/5ML PO SOLN: 5.0000 mg | Freq: Once | ORAL | Status: DC | PRN

## 2024-06-29 NOTE — Progress Notes (Signed)
"                                          [facility], hospitalist service, overnight cross coverage  Was notified by the patient's RN regarding the patient having 1 episode of bright red blood per rectum overnight.  The patient states he has never bled this much.  History of gastric ulcer and gastritis seen on EGD by Dr. Maryruth done on 08/03/2021.  Denies use of NSAIDs.  Last alcohol intake, beer, was 4 to 5 days ago.  The patient is incarcerated.  Denies heavy alcohol use.  History of treated hepatitis C with cirrhosis.  History of paroxysmal atrial fibrillation, not on oral anticoagulation.  On exam, abdomen is tender with palpation.  Has a ventral hernia.  Bowel sounds are present.  Lungs are clear to auscultation.  No wheezes or rales.  Bilateral lower extremity 2+ pitting edema all the way up to above the knees.  Albumin  level 3.9.  proBNP is pending.  If elevated, obtain transthoracic echo.    Family history of premature coronary artery disease in his father who deceased at the age of 53, died in his sleep.  IV PPI, Protonix , 40 mg twice daily initiated.  [facility] [place] consulted.   Critical care time: 35 minutes. "

## 2024-06-29 NOTE — Op Note (Signed)
 Full Operative Report  Date of Operation: 9:15 AM, 06/29/2024   Patient: [name] - 54 y.o. male  Surgeon: Malvin Marsa FALCON, DPM   Assistant: None  Diagnosis: Osteomyelitis right great toe  Procedure:  1. Amputation of right great toe mid proximal phalanx level    Anesthesia: Monitor Anesthesia Care  Boone Fess, MD  Anesthesiologist: Boone Fess, MD CRNA: Vertie Arthea RAMAN, CRNA; Delores Bouchard R, CRNA   Estimated Blood Loss: Minimal   Hemostasis: 1) Anatomical dissection, mechanical compression, electrocautery 2) No tourniquet   Implants: * No implants in log *  Materials: prolene 3-0  Injectables: 1) Pre-operatively: 10 cc of 50:50 mixture 1%lidocaine  plain and 0.5% marcaine  plain 2) Post-operatively: None   Specimens: - Pathology: Right great toe - Microbiology: tissue culture right great toe   Antibiotics: IV antibiotics given per schedule on the floor  Drains: None  Complications: Patient tolerated the procedure well without complication.   Operative findings: As below in detailed report  Indications for Procedure: [name] presents to Malvin Marsa FALCON, DPM with a chief complaint of Right great toe ulceration, osteomyelitis of distal phalanx on MRI. The patient has failed conservative treatments of various modalities. At this time the patient has elected to proceed with surgical correction. All alternatives, risks, and complications of the procedures were thoroughly explained to the patient. Patient exhibits appropriate understanding of all discussion points and informed consent was signed and obtained in the chart with no guarantees to surgical outcome given or implied.  Description of Procedure: Patient was brought to the operating room. Patient remained on their hospital bed in the supine position. A surgical timeout was performed and all members of the operating room, the procedure, and the surgical site were identified.  anesthesia occurred as per anesthesia record. Local anesthetic as previously described was then injected about the operative field in a local infiltrative block.  The operative lower extremity as noted above was then prepped and draped in the usual sterile manner. The following procedure then began.  Attention was directed to the first digit on the RIGHT foot. A full-thickness incision encompassing the entire digit was made using a #15 blade. Dissection was carried down to bone. The toe was secured with a towel clamp, further dissected in its entirety, and disarticulated at the IPJ and passed to the back table as a gross specimen. This was then labled and sent to pathology. The bone was noted to be soft and eroded, and consistent with osteomyelitis. A tissue culture was harvested from the deep tissue under the ulcer. A sagittal saw was then used to resect through the mid proximal pahalnx for closure and margin purposes. All remaining necrotic and devitalized soft tissue structures were visualized and dissected away using sharp and dull dissection. Care was taken to protect all neurovascular structures throughout the dissection. All bleeders were cauterized as necessary. A The area was then flushed with copious amounts of sterile saline. Then using the suture materials previously described, the site was closed in anatomic layers and the skin was well approximated under minimal tension.  The surgical site was then dressed with xeroform, 4x4, kerlix and ace wrap. The patient tolerated both the procedure and anesthesia well with vital signs stable throughout. The patient was transferred in good condition and all vital signs stable  from the OR to recovery under the discretion of anesthesia.  Condition: Vital signs stable, neurovascular status unchanged from preoperative   Surgical plan:  WBAT in post op shoe, expect  clean margin, 3 days PO abx from now. Stable for DC tmrw from my standpoint. Follow up 1 week  office to call to arrange.   The patient will be WBAT in a post op shoe to the operative limb until further instructed. The dressing is to remain clean, dry, and intact. Will continue to follow unless noted elsewhere.   Marsa Honour, DPM [facility]

## 2024-06-29 NOTE — Anesthesia Preprocedure Evaluation (Signed)
 "                                  Anesthesia Evaluation  Patient identified by MRN, date of birth, ID band Patient awake    Reviewed: Allergy & Precautions, NPO status , Patient's Chart, lab work & pertinent test results  History of Anesthesia Complications Negative for: history of anesthetic complications  Airway Mallampati: II  TM Distance: >3 FB Neck ROM: Full    Dental no notable dental hx. (+) Teeth Intact   Pulmonary neg sleep apnea, neg COPD, Current Smoker and Patient abstained from smoking.   Pulmonary exam normal breath sounds clear to auscultation       Cardiovascular Exercise Tolerance: Good METS(-) hypertension(-) CAD and (-) Past MI (-) dysrhythmias  Rhythm:Regular Rate:Normal - Systolic murmurs  1. Left ventricular ejection fraction, by estimation, is 60 to 65%. The  left ventricle has normal function. The left ventricle has no regional  wall motion abnormalities. Left ventricular diastolic parameters were  normal.   2. Right ventricular systolic function is normal. The right ventricular  size is normal. There is mildly elevated pulmonary artery systolic  pressure. The estimated right ventricular systolic pressure is 40.0 mmHg.   3. Left atrial size was mildly dilated.   4. Mild mitral valve regurgitation.     Neuro/Psych  PSYCHIATRIC DISORDERS  Depression    On chronic methadone   Neuromuscular disease    GI/Hepatic ,neg GERD  ,,  Endo/Other  neg diabetes    Renal/GU negative Renal ROS     Musculoskeletal   Abdominal   Peds  Hematology   Anesthesia Other Findings Past Medical History: No date: Alcohol abuse No date: Anemia     Comment:  a. 01/2020 s/p fall w/ significant R hip hematoma-->4 u               prbcs during admission. 01/2020: Delirium tremens (HCC) No date: Hepatic failure (HCC)     Comment:  a. 01/2020 - ETOH/sepsis/rhabdo No date: Polysubstance abuse (HCC) No date: Respiratory failure (HCC)     Comment:  a.  01/2020 in setting of DTs/Sepsis req intubation. 01/2020: Rhabdomyolysis 01/2020: Sepsis (HCC)  Reproductive/Obstetrics                              Anesthesia Physical Anesthesia Plan  ASA: 3  Anesthesia Plan: MAC   Post-op Pain Management: Minimal or no pain anticipated   Induction: Intravenous  PONV Risk Score and Plan: 2 and Propofol  infusion, TIVA, Ondansetron , Midazolam  and Dexamethasone   Airway Management Planned: Nasal Cannula  Additional Equipment: None  Intra-op Plan:   Post-operative Plan:   Informed Consent: I have reviewed the patients History and Physical, chart, labs and discussed the procedure including the risks, benefits and alternatives for the proposed anesthesia with the patient or authorized representative who has indicated his/her understanding and acceptance.     Dental advisory given  Plan Discussed with: CRNA and Surgeon  Anesthesia Plan Comments: (Patient had sandwich at 0045, will wait to proceed until 0845. Denies N/V or full stomach feelings.  Discussed risks of anesthesia with patient, including possibility of difficulty with spontaneous ventilation under anesthesia necessitating airway intervention, PONV, and rare risks such as cardiac or respiratory or neurological events, and allergic reactions. Discussed the role of CRNA in patient's perioperative care. Patient understands.)  Anesthesia Quick Evaluation  "

## 2024-06-29 NOTE — Transfer of Care (Signed)
 Immediate Anesthesia Transfer of Care Note  Patient: [name]  Procedure(s) Performed: AMPUTATION, TOE (Right: Toe)  Patient Location: PACU  Anesthesia Type:MAC  Level of Consciousness: awake and alert   Airway & Oxygen Therapy: Patient Spontanous Breathing  Post-op Assessment: Report given to RN  Post vital signs: Reviewed and stable  Last Vitals:  Vitals Value Taken Time  BP 105/69 06/29/24 09:20  Temp 36.6 C 06/29/24 09:20  Pulse 67 06/29/24 09:23  Resp 21 06/29/24 09:20  SpO2 93 % 06/29/24 09:23  Vitals shown include unfiled device data.  Last Pain:  Vitals:   06/29/24 0920  TempSrc:   PainSc: 0-No pain      Patients Stated Pain Goal: 5 (06/29/24 0816)  Complications: No notable events documented.

## 2024-06-29 NOTE — Progress Notes (Signed)
 [facility] night cross cover note:   I was notified by the patient's RN that the patient was complaining of discomfort at the site of his toe amputation, which occurred earlier today.  He has an existing order for methadone  95 mg p.o. daily and received his dose of this yesterday, but missed his daily dose of methadone  this morning, as he was n.p.o. at the time that it was due.  I subsequently placed an order for methadone  95 mg p.o. x 1 dose now to address the above.     Eva Pore, DO Hospitalist

## 2024-06-29 NOTE — Anesthesia Postprocedure Evaluation (Signed)
"   Anesthesia Post Note  Patient: [name]  Procedure(s) Performed: AMPUTATION, TOE (Right: Toe)     Patient location during evaluation: PACU Anesthesia Type: MAC Level of consciousness: awake and alert Pain management: pain level controlled Vital Signs Assessment: post-procedure vital signs reviewed and stable Respiratory status: spontaneous breathing, nonlabored ventilation, respiratory function stable and patient connected to nasal cannula oxygen Cardiovascular status: stable and blood pressure returned to baseline Postop Assessment: no apparent nausea or vomiting Anesthetic complications: no   No notable events documented.  Last Vitals:  Vitals:   06/29/24 0930 06/29/24 0941  BP: 101/64 119/71  Pulse: 64 71  Resp: 14 16  Temp:  36.7 C  SpO2: 91% 92%    Last Pain:  Vitals:   06/29/24 0941  TempSrc:   PainSc: 0-No pain                 [name]      "

## 2024-06-29 NOTE — Progress Notes (Signed)
 [facility] night cross cover note:   Per patient's request, I have resumed his home Xanax 4 mg p.o. nightly as needed for muscle spasms.   Eva Pore, DO Hospitalist

## 2024-06-29 NOTE — Progress Notes (Signed)
 Orthopedic Tech Progress Note Patient Details:  [name] 27-Dec-1970 [id number]  Ortho Devices Type of Ortho Device: Postop shoe/boot Ortho Device/Splint Interventions: Ordered      Efrain A Shaiann Mcmanamon 06/29/2024, 11:09 AM
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
## 2024-06-29 NOTE — Consult Note (Signed)
 "  PODIATRY CONSULTATION  NAME [name] MRN [id number] DOB 04-03-1971 DOA 06/27/2024   Reason for consult:  Chief Complaint  Patient presents with   Foot Pain    Attending/Consulting physician: FABIENE Lamy MD  History of present illness: [name] is a 54 y.o. male with medical history significant of paroxysmal atrial fibrillation, ulcer right foot, neuropathy, anemia, chronic hepatitis C, alcoholic cirrhosis,  alcohol abuse, on methadone , presents with a chronic non-healing foot wound.   Patient has followed up with her office in the past.  Per the notes it appears the wound was stable or may be getting slightly better and decision was made to hold off any amputation at that time.  Unfortunately has had worsening bleeding and malodor.  Additionally he is incarcerated and not able to take care of it is much as he was before.  At this point he is amenable to amputation of the great toe.  We discussed this and he was in agreement with plan patient patient.  Past Medical History:  Diagnosis Date   Alcohol abuse    Anemia    a. 01/2020 s/p fall w/ significant R hip hematoma-->4 u prbcs during admission.   Delirium tremens (HCC) 01/2020   Hepatic failure (HCC)    a. 01/2020 - ETOH/sepsis/rhabdo   Polysubstance abuse (HCC)    Respiratory failure (HCC)    a. 01/2020 in setting of DTs/Sepsis req intubation.   Rhabdomyolysis 01/2020   Sepsis (HCC) 01/2020       Latest Ref Rng & Units 06/29/2024   12:13 AM 06/27/2024    8:54 PM 05/14/2024    2:56 PM  CBC  WBC 4.0 - 10.5 K/uL 5.4  6.1    Hemoglobin 13.0 - 17.0 g/dL 85.6  84.7  86.2   Hematocrit 39.0 - 52.0 % 41.4  44.8  40.4   Platelets 150 - 400 K/uL 132  142         Latest Ref Rng & Units 06/29/2024   12:13 AM 06/27/2024    8:54 PM 11/04/2022    5:51 PM  BMP  Glucose 70 - 99 mg/dL 896  84  890   BUN 6 - 20 mg/dL 14  11  20    Creatinine 0.61 - 1.24 mg/dL 9.21  9.30  9.09   Sodium 135 - 145 mmol/L 136  139  139   Potassium  3.5 - 5.1 mmol/L 4.2  4.9  3.6   Chloride 98 - 111 mmol/L 100  101  102   CO2 22 - 32 mmol/L 28  26  25    Calcium  8.9 - 10.3 mg/dL 8.7  9.7  9.4       Physical Exam: Lower Extremity Exam  Ulceration plantar aspect right hallux with significant erythema and edema of the hallux  Palpable DP and PT pulses  Sensation diminished to light touch      ASSESSMENT/PLAN OF CARE 54 y.o. male with PMHx significant for  paroxysmal atrial fibrillation, ulcer right foot, neuropathy, anemia, chronic hepatitis C, alcoholic cirrhosis,  alcohol abuse, on methadone , presents with a chronic non-healing foot wound  with ulceration right great toe with underlying osteomyelitis of the distal phalanx  ESR 17 A1c 5.1  -Recommend right great toe amputation.  This will come back to the mid proximal phalanx vs MPJ level due to the extent of the soft tissue ulceration.  He agrees to proceed.  - Continue IV abx broad spectrum pending further culture data - Anticoagulation: Per  primary - Wound care: None needed postop - WB status: Will be weightbearing as tolerated in postop shoe following surgery - Will continue to follow   Thank you for the consult.  Please contact me directly with any questions or concerns.           Marolyn JULIANNA Honour, DPM [facility]    [address] [place], [place] [address]                Office [phone]  Fax [phone]     "

## 2024-06-29 NOTE — Consult Note (Signed)
 "  Referring Provider: Dr. Christobal Primary Care Physician:  Pcp, No Primary Gastroenterologist:  Sampson, seen by [facility] but looks like just inpatient  Reason for Consultation:  Rectal bleeding  HPI: [name] is a 54 y.o. 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  Had one episode of rectal bleeding last night.  Hgb is 14.3 grams.  He says that it was bright red blood in the toilet bowl and on the toilet paper.  Stool was hard, like rabbit pellets.  Has an abdominal hernia and reports pain when pushing in that area, has intentions to see a surgeon about that.  Otherwise has intermittent lower abdominal pain but nothing persistent.  Moves his bowels fairly regularly at home.  Negative cologuard 06/2020.  Never had a colonoscopy.  EGD 07/2021: - Normal esophagus. - Gastritis. Biopsied. - Non- bleeding duodenal ulcer with a clean ulcer base ( Forrest Class III) .  DIAGNOSIS:  A. STOMACH; COLD BIOPSY:  - HEALING EROSIVE AND REACTIVE GASTRITIS.  - NEGATIVE FOR H. PYLORI, INTESTINAL METAPLASIA, DYSPLASIA, AND  MALIGNANCY.    Past Medical History:  Diagnosis Date   Alcohol abuse    Anemia    a. 01/2020 s/p fall w/ significant R hip hematoma-->4 u prbcs during admission.   Delirium tremens (HCC) 01/2020   Hepatic failure (HCC)    a. 01/2020 - ETOH/sepsis/rhabdo   Polysubstance abuse (HCC)    Respiratory failure (HCC)    a. 01/2020 in setting of DTs/Sepsis req intubation.   Rhabdomyolysis 01/2020   Sepsis (HCC) 01/2020    Past Surgical History:  Procedure Laterality Date   ESOPHAGOGASTRODUODENOSCOPY (EGD) WITH PROPOFOL  N/A 08/03/2021   Procedure: ESOPHAGOGASTRODUODENOSCOPY (EGD) WITH PROPOFOL ;  Surgeon:  Maryruth Ole DASEN, MD;  Location: [facility] ENDOSCOPY;  Service: Endoscopy;  Laterality: N/A;    Prior to Admission medications  Medication Sig Start Date End Date Taking? Authorizing Provider  albuterol  (VENTOLIN  HFA) 108 (90 Base) MCG/ACT inhaler Inhale 2 puffs into the lungs every 6 (six) hours as needed for wheezing or shortness of breath. 07/27/23  Yes Gladis Elsie BROCKS, PA-C  aspirin  81 MG chewable tablet Chew 81 mg by mouth daily.   Yes [provider]  gabapentin  (NEURONTIN ) 400 MG capsule Take 400 mg by mouth 2 (two) times daily.   Yes [provider]  gentamicin  cream (GARAMYCIN ) 0.1 % Apply 1 Application topically 2 (two) times daily. 05/15/24  Yes Janit Thresa HERO, DPM  hydrochlorothiazide  (HYDRODIURIL ) 12.5 MG tablet Take 12.5 mg by mouth daily.   Yes [provider]  methadone  (DOLOPHINE ) 10 MG/ML solution Take 10 mg by mouth every 12 (twelve) hours. Pt says he takes 95 MG daily.   Yes [provider]  Multiple Vitamins-Minerals (MULTIVITAMIN WITH MINERALS) tablet Take 1 tablet by mouth daily.   Yes [provider]  mupirocin  ointment (BACTROBAN ) 2 % Apply topically 2 (two) times daily.   Yes [provider]  Testosterone  1.62 % GEL APPLY 3 PUMPS TOPICALLY ONCE DAILY 01/24/24  Yes Stoioff, Glendia BROCKS, MD  tiZANidine  (ZANAFLEX ) 4 MG tablet Take 1 tablet (4 mg total) by mouth every 8 (eight) hours as needed for muscle spasms. 12/12/23  Yes Stoioff, Glendia BROCKS, MD  benzonatate  (TESSALON ) 100 MG capsule Take 1 capsule (100 mg  total) by mouth 3 (three) times daily as needed for cough. Patient not taking: Reported on 06/28/2024 07/27/23   Gladis Elsie BROCKS, PA-C  gabapentin  (NEURONTIN ) 600 MG tablet Take 600 mg by mouth 2 (two) times daily. Patient not taking: Reported on 06/28/2024 07/14/23   [provider]  lactulose  (CHRONULAC ) 10 GM/15ML solution SMARTSIG:Tablespoon By Mouth Daily Patient not taking: Reported on 06/28/2024 07/06/21   [provider]    Current Facility-Administered Medications  Medication Dose Route Frequency Provider Last Rate Last Admin   acetaminophen  (TYLENOL ) tablet 650 mg  650 mg Oral Q6H PRN Smith, Rondell A, MD       Or   acetaminophen  (TYLENOL ) suppository 650 mg  650 mg Rectal Q6H PRN Claudene Reeves A, MD       albuterol  (PROVENTIL ) (2.5 MG/3ML) 0.083% nebulizer solution 2.5 mg  2.5 mg Nebulization Q6H PRN Smith, Rondell A, MD       Ampicillin -Sulbactam (UNASYN ) 3 g in sodium chloride  0.9 % 100 mL IVPB  3 g Intravenous Q6H Smith, Rondell A, MD 200 mL/hr at 06/29/24 1207 3 g at 06/29/24 1207   aspirin  chewable tablet 81 mg  81 mg Oral Daily Smith, Rondell A, MD   81 mg at 06/29/24 1224   folic acid  (FOLVITE ) tablet 1 mg  1 mg Oral Daily Claudene, Rondell A, MD   1 mg at 06/29/24 1224   gabapentin  (NEURONTIN ) capsule 400 mg  400 mg Oral BID Smith, Rondell A, MD   400 mg at 06/29/24 1223   hydrochlorothiazide  (HYDRODIURIL ) tablet 12.5 mg  12.5 mg Oral Daily Claudene, Rondell A, MD   12.5 mg at 06/29/24 1224   linezolid  (ZYVOX ) IVPB 600 mg  600 mg Intravenous Q12H Smith, Rondell A, MD 300 mL/hr at 06/28/24 2208 600 mg at 06/28/24 2208   LORazepam  (ATIVAN ) tablet 1-4 mg  1-4 mg Oral Q1H PRN Claudene Reeves LABOR, MD       Or   LORazepam  (ATIVAN ) injection 1-4 mg  1-4 mg Intravenous Q1H PRN Smith, Rondell A, MD       magnesium  oxide (MAG-OX) tablet 400 mg  400 mg Oral BID Kc, Ramesh, MD   400 mg at 06/29/24 1229   methadone  (DOLOPHINE ) tablet 95 mg  95 mg Oral Daily Smith, Rondell A, MD       multivitamin with minerals tablet 1 tablet  1 tablet Oral Daily Smith, Rondell A, MD   1 tablet at 06/29/24 1223   ondansetron  (ZOFRAN ) tablet 4 mg  4 mg Oral Q6H PRN Smith, Rondell A, MD       Or   ondansetron  (ZOFRAN ) injection 4 mg  4 mg Intravenous Q6H PRN Smith, Rondell A, MD       pantoprazole  (PROTONIX ) injection 40 mg  40 mg Intravenous BID Shona Laurence N, DO   40 mg at 06/29/24 1223   sodium chloride  flush (NS) 0.9  % injection 3 mL  3 mL Intravenous Q12H Smith, Rondell A, MD   3 mL at 06/28/24 2209   thiamine  (VITAMIN B1) tablet 100 mg  100 mg Oral Daily Claudene, Rondell A, MD   100 mg at 06/29/24 1223   Or   thiamine  (VITAMIN B1) injection 100 mg  100 mg Intravenous Daily Claudene Reeves A, MD        Allergies as of 06/27/2024   (No Known Allergies)    History reviewed. No pertinent family history.  Social History   Socioeconomic History   Marital status:  Single    Spouse name: Not on file   Number of children: Not on file   Years of education: Not on file   Highest education level: Not on file  Occupational History   Not on file  Tobacco Use   Smoking status: Every Day    Types: E-cigarettes   Smokeless tobacco: Never  Vaping Use   Vaping status: Never Used  Substance and Sexual Activity   Alcohol use: Not Currently    Comment: 5-6 every other day   Drug use: Not Currently   Sexual activity: Not on file  Other Topics Concern   Not on file  Social History Narrative   Not on file   Social Drivers of Health   Tobacco Use: High Risk (06/29/2024)   Patient History    Smoking Tobacco Use: Every Day    Smokeless Tobacco Use: Never    Passive Exposure: Not on file  Financial Resource Strain: Not on file  Food Insecurity: Not on file  Transportation Needs: Not on file  Physical Activity: Not on file  Stress: Not on file  Social Connections: Not on file  Intimate Partner Violence: Not on file  Depression (EYV7-0): Not on file  Alcohol Screen: Not on file  Housing: Not on file  Utilities: Not on file  Health Literacy: Not on file    Review of Systems: ROS is O/W negative except as mentioned in HPI.  Physical Exam: Vital signs in last 24 hours: Temp:  [97.9 F (36.6 C)-99 F (37.2 C)] 98 F (36.7 C) (02/06 0941) Pulse Rate:  [62-71] 71 (02/06 0941) Resp:  [14-21] 16 (02/06 0941) BP: (101-122)/(51-71) 119/71 (02/06 0941) SpO2:  [91 %-97 %] 92 % (02/06 0941) Weight:  [61.2  kg] 61.2 kg (02/06 0817) Last BM Date : 06/28/24 General:  Alert, Well-developed, well-nourished, pleasant and cooperative in NAD Head:  Normocephalic and atraumatic. Eyes:  Sclera clear, no icterus.  Conjunctiva pink. Ears:  Normal auditory acuity. Mouth:  No deformity or lesions.   Lungs:  Clear throughout to auscultation.  No wheezes, crackles, or rhonchi.  Heart:  Regular rate and rhythm; no murmurs, clicks, rubs, or gallops. Abdomen:  Soft, non-distended.  BS present.  Mild left sided TTP.   Rectal:  No external abnormalities noted.  DRE did not reveal any masses with just a trace of light brown stool noted on the exam glove. Msk:  Symmetrical without gross deformities. Pulses:  Normal pulses noted. Extremities:  Without clubbing or edema. Neurologic:  Alert and oriented x 4;  grossly normal neurologically. Skin:  Intact without significant lesions or rashes. Psych:  Alert and cooperative. Normal mood and affect.  Intake/Output from previous day: 02/05 0701 - 02/06 0700 In: 395.9 [P.O.:300; IV Piggyback:95.9] Out: 650 [Urine:650] Intake/Output this shift: Total I/O In: 425.2 [I.V.:425.2] Out: -   Lab Results: Recent Labs    06/27/24 2054 06/29/24 0013  WBC 6.1 5.4  HGB 15.2 14.3  HCT 44.8 41.4  PLT 142* 132*   BMET Recent Labs    06/27/24 2054 06/29/24 0013  NA 139 136  K 4.9 4.2  CL 101 100  CO2 26 28  GLUCOSE 84 103*  BUN 11 14  CREATININE 0.69 0.78  CALCIUM  9.7 8.7*   LFT Recent Labs    06/27/24 2054  PROT 7.5  ALBUMIN  3.9  AST 175*  ALT 69*  ALKPHOS 100  BILITOT 2.6*   Studies/Results: VAS US  ABI WITH/WO TBI Result Date: 06/29/2024  LOWER EXTREMITY  DOPPLER STUDY Patient Name:  [name]  Date of Exam:   06/28/2024 Medical Rec #: [id number]          Accession #:    [id number] Date of Birth: 11-Apr-1971         Patient Gender: M Patient Age:   68 years Exam Location:  [facility] Procedure:      VAS US  ABI WITH/WO TBI Referring Phys:  MAXIMINO SHARPS --------------------------------------------------------------------------------  Indications: Ulceration. Osteomyelitis of right hallux. Pre-op. High Risk Factors: Current smoker.  Comparison Study: No prior exam. Performing Technologist: Edilia Elden Appl  Examination Guidelines: A complete evaluation includes at minimum, Doppler waveform signals and systolic blood pressure reading at the level of bilateral brachial, anterior tibial, and posterior tibial arteries, when vessel segments are accessible. Bilateral testing is considered an integral part of a complete examination. Photoelectric Plethysmograph (PPG) waveforms and toe systolic pressure readings are included as required and additional duplex testing as needed. Limited examinations for reoccurring indications may be performed as noted.  ABI Findings: +---------+------------------+-----+---------+--------+ Right    Rt Pressure (mmHg)IndexWaveform Comment  +---------+------------------+-----+---------+--------+ Brachial 133                    triphasic         +---------+------------------+-----+---------+--------+ PTA      181               1.28 triphasic         +---------+------------------+-----+---------+--------+ DP       169               1.20 triphasic         +---------+------------------+-----+---------+--------+ Great Toe188               1.33 Normal            +---------+------------------+-----+---------+--------+ +---------+------------------+-----+---------+-------+ Left     Lt Pressure (mmHg)IndexWaveform Comment +---------+------------------+-----+---------+-------+ Brachial 141                    triphasic        +---------+------------------+-----+---------+-------+ PTA      173               1.23 triphasic        +---------+------------------+-----+---------+-------+ DP       169               1.20 triphasic        +---------+------------------+-----+---------+-------+  Great Toe190               1.35 Normal           +---------+------------------+-----+---------+-------+ +-------+-----------+-----------+------------+------------+ ABI/TBIToday's ABIToday's TBIPrevious ABIPrevious TBI +-------+-----------+-----------+------------+------------+ Right  1.28       1.33                                +-------+-----------+-----------+------------+------------+ Left   1.23       1.35                                +-------+-----------+-----------+------------+------------+  Summary: Right: Resting right ankle-brachial index is within normal range. The right toe-brachial index is normal.  Left: Resting left ankle-brachial index is within normal range. The left toe-brachial index is abnormal.  *See table(s) above for measurements and observations.  Electronically signed by Lonni Gaskins MD on 06/29/2024 at 11:54:45 AM.    Final  MR TOES RIGHT WO CONTRAST Result Date: 06/28/2024 CLINICAL DATA:  Open wound of the right great toe with drainage and pain. Evaluate for osteomyelitis. EXAM: MRI OF THE RIGHT TOES WITHOUT CONTRAST TECHNIQUE: Multiplanar, multisequence MR imaging of the right toes was performed. No intravenous contrast was administered. COMPARISON:  Radiographs 06/27/2024 and 06/24/2023.  MRI 07/08/2023. FINDINGS: Technical note: Despite efforts by the technologist and patient, mild motion artifact is present on today's exam and could not be eliminated. This reduces exam sensitivity and specificity. Bones/Joint/Cartilage There is soft tissue ulceration along the medial plantar aspect of the great toe interphalangeal joint. Underlying progressive marrow changes within the distal phalanx of the great toe suspicious for osteomyelitis. Specifically, there is marrow T2 hyperintensity with a possible small tuftal erosion medially. The proximal phalanx is intact. The additional digits and visualized metatarsals are intact. There is chronic posttraumatic  deformity of the 2nd metatarsal and mild chronic degenerative changes within the tibial sesamoid of the 1st metatarsal. No significant joint effusions. Ligaments The collateral ligaments of the metatarsophalangeal joints appear intact. Muscles and Tendons The forefoot muscles and tendons appear unremarkable. No significant tenosynovitis. Soft tissues As above, chronic, progressive soft tissue ulceration along the medial plantar aspect of the great toe. There are underlying soft tissue inflammatory changes without organized fluid collection or foreign body. Progressive generalized dorsal subcutaneous edema without focal component or apparent soft tissue ulceration. IMPRESSION: 1. Chronic, progressive soft tissue ulceration along the medial plantar aspect of the great toe interphalangeal joint with underlying soft tissue inflammatory changes. No organized fluid collection or foreign body. 2. Progressive marrow changes within the distal phalanx of the great toe suspicious for osteomyelitis. 3. Progressive generalized dorsal subcutaneous edema without focal component or apparent soft tissue ulceration. 4. 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 Electronically Signed   By: Elsie Perone M.D.   On: 06/28/2024 12:57   DG Foot Complete Right Result Date: 06/27/2024 CLINICAL DATA:  Right great toe wound concerning for osteomyelitis EXAM: RIGHT FOOT COMPLETE - 3+ VIEW COMPARISON:  06/24/2023 FINDINGS: Frontal, oblique, lateral views of the right foot are obtained. There are no acute displaced fractures. Marked soft tissue swelling and ulceration along the medial aspect of the first digit. There is no underlying bony destruction or periosteal reaction to suggest osteomyelitis. Mild diffuse osteoarthritis. Diffuse soft tissue swelling throughout the forefoot, midfoot, and hindfoot. IMPRESSION: 1. Soft tissue swelling and ulceration of the first digit, with no radiographic evidence of acute osteomyelitis. 2.  Diffuse soft tissue swelling. 3. Osteoarthritis. Electronically Signed   By: Ozell Daring M.D.   On: 06/27/2024 22:08   IMPRESSION:  Rectal bleeding:  On episode last night, bright red blood.  Never had a colonoscopy but had a negative Cologuard in 2022.  Most likely hemorrhoidal (internal hemorrhoids as no external abnormalities were seen).  Atrial fibrillation, not on anticoagulation  ? 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 Leigh.  He can follow up as an outpatient to have this done in the near future when able. -Monitor Hgb. -If has recurrent bleeding could consider hydrocortisone suppositories for now.   Harlene BIRCH. Jerrelle Michelsen  06/29/2024, 12:58 PM      "

## 2024-07-03 ENCOUNTER — Ambulatory Visit: Admitting: Podiatry

## 2024-11-12 ENCOUNTER — Other Ambulatory Visit

## 2024-11-20 ENCOUNTER — Ambulatory Visit: Admitting: Urology
